# Patient Record
Sex: Female | Born: 1950
Health system: Southern US, Community
[De-identification: ages and names within clinical notes are randomized; demographics above are authoritative.]

## PROBLEM LIST (undated history)

## (undated) DIAGNOSIS — M6530 Trigger finger, unspecified finger: Secondary | ICD-10-CM

## (undated) DIAGNOSIS — I519 Heart disease, unspecified: Secondary | ICD-10-CM

## (undated) DIAGNOSIS — Z9989 Dependence on other enabling machines and devices: Secondary | ICD-10-CM

## (undated) DIAGNOSIS — I50810 Right heart failure, unspecified: Secondary | ICD-10-CM

## (undated) DIAGNOSIS — I493 Ventricular premature depolarization: Secondary | ICD-10-CM

## (undated) DIAGNOSIS — E079 Disorder of thyroid, unspecified: Secondary | ICD-10-CM

## (undated) DIAGNOSIS — I272 Pulmonary hypertension, unspecified: Secondary | ICD-10-CM

## (undated) DIAGNOSIS — E669 Obesity, unspecified: Secondary | ICD-10-CM

## (undated) DIAGNOSIS — I10 Essential (primary) hypertension: Secondary | ICD-10-CM

## (undated) DIAGNOSIS — Z780 Asymptomatic menopausal state: Secondary | ICD-10-CM

## (undated) DIAGNOSIS — N184 Chronic kidney disease, stage 4 (severe): Secondary | ICD-10-CM

## (undated) DIAGNOSIS — E785 Hyperlipidemia, unspecified: Secondary | ICD-10-CM

## (undated) DIAGNOSIS — T8859XA Other complications of anesthesia, initial encounter: Secondary | ICD-10-CM

## (undated) DIAGNOSIS — E0590 Thyrotoxicosis, unspecified without thyrotoxic crisis or storm: Secondary | ICD-10-CM

## (undated) DIAGNOSIS — I6529 Occlusion and stenosis of unspecified carotid artery: Secondary | ICD-10-CM

## (undated) DIAGNOSIS — G4733 Obstructive sleep apnea (adult) (pediatric): Secondary | ICD-10-CM

## (undated) DIAGNOSIS — T4145XA Adverse effect of unspecified anesthetic, initial encounter: Secondary | ICD-10-CM

## (undated) DIAGNOSIS — H3520 Other non-diabetic proliferative retinopathy, unspecified eye: Secondary | ICD-10-CM

## (undated) DIAGNOSIS — R001 Bradycardia, unspecified: Secondary | ICD-10-CM

## (undated) DIAGNOSIS — R809 Proteinuria, unspecified: Secondary | ICD-10-CM

## (undated) DIAGNOSIS — E782 Mixed hyperlipidemia: Principal | ICD-10-CM

## (undated) DIAGNOSIS — E042 Nontoxic multinodular goiter: Secondary | ICD-10-CM

## (undated) DIAGNOSIS — N183 Chronic kidney disease, stage 3 unspecified: Secondary | ICD-10-CM

## (undated) HISTORY — DX: Hyperlipidemia, unspecified: E78.5

## (undated) HISTORY — DX: Disorder of thyroid, unspecified: E07.9

## (undated) HISTORY — DX: Essential (primary) hypertension: I10

## (undated) HISTORY — DX: Obesity, unspecified: E66.9

## (undated) HISTORY — DX: Heart disease, unspecified: I51.9

## (undated) HISTORY — DX: Other non-diabetic proliferative retinopathy, unspecified eye: H35.20

## (undated) HISTORY — DX: Thyrotoxicosis, unspecified without thyrotoxic crisis or storm: E05.90

## (undated) HISTORY — DX: Asymptomatic menopausal state: Z78.0

## (undated) HISTORY — DX: Dependence on other enabling machines and devices: Z99.89

## (undated) HISTORY — DX: Chronic kidney disease, stage 4 (severe): N18.4

## (undated) HISTORY — DX: Occlusion and stenosis of unspecified carotid artery: I65.29

## (undated) HISTORY — DX: Bradycardia, unspecified: R00.1

## (undated) HISTORY — DX: Nontoxic multinodular goiter: E04.2

## (undated) HISTORY — DX: Ventricular premature depolarization: I49.3

## (undated) HISTORY — DX: Right heart failure, unspecified: I50.810

## (undated) HISTORY — DX: Chronic kidney disease, stage 3 unspecified: N18.30

## (undated) HISTORY — DX: Trigger finger, unspecified finger: M65.30

## (undated) HISTORY — DX: Proteinuria, unspecified: R80.9

## (undated) HISTORY — DX: Mixed hyperlipidemia: E78.2

## (undated) HISTORY — DX: Obstructive sleep apnea (adult) (pediatric): G47.33

## (undated) HISTORY — DX: Pulmonary hypertension, unspecified: I27.20

## (undated) HISTORY — DX: Morbid (severe) obesity due to excess calories: E66.01

---
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
## 1999-03-04 HISTORY — PX: NERVE SURGERY: SHX1016

## 2005-04-21 ENCOUNTER — Other Ambulatory Visit: Admission: RE | Admit: 2005-04-21 | Discharge: 2005-04-21 | Payer: Self-pay | Admitting: Family Medicine

## 2005-12-16 ENCOUNTER — Encounter: Admission: RE | Admit: 2005-12-16 | Discharge: 2005-12-16 | Payer: Self-pay | Admitting: Family Medicine

## 2006-01-01 ENCOUNTER — Encounter: Admission: RE | Admit: 2006-01-01 | Discharge: 2006-01-01 | Payer: Self-pay | Admitting: Family Medicine

## 2007-01-15 ENCOUNTER — Encounter: Admission: RE | Admit: 2007-01-15 | Discharge: 2007-01-15 | Payer: Self-pay | Admitting: Interventional Cardiology

## 2007-02-10 ENCOUNTER — Ambulatory Visit: Payer: Self-pay | Admitting: Pulmonary Disease

## 2007-02-10 ENCOUNTER — Inpatient Hospital Stay (HOSPITAL_COMMUNITY): Admission: EM | Admit: 2007-02-10 | Discharge: 2007-02-16 | Payer: Self-pay | Admitting: Emergency Medicine

## 2007-02-12 ENCOUNTER — Encounter (INDEPENDENT_AMBULATORY_CARE_PROVIDER_SITE_OTHER): Payer: Self-pay | Admitting: Internal Medicine

## 2007-02-12 ENCOUNTER — Ambulatory Visit: Payer: Self-pay | Admitting: Vascular Surgery

## 2007-02-15 ENCOUNTER — Encounter: Payer: Self-pay | Admitting: Pulmonary Disease

## 2007-02-16 ENCOUNTER — Encounter: Payer: Self-pay | Admitting: Pulmonary Disease

## 2007-03-22 ENCOUNTER — Ambulatory Visit: Payer: Self-pay | Admitting: Pulmonary Disease

## 2007-03-22 DIAGNOSIS — J449 Chronic obstructive pulmonary disease, unspecified: Secondary | ICD-10-CM

## 2007-03-22 DIAGNOSIS — E785 Hyperlipidemia, unspecified: Secondary | ICD-10-CM

## 2007-03-22 DIAGNOSIS — I10 Essential (primary) hypertension: Secondary | ICD-10-CM

## 2007-03-22 DIAGNOSIS — Z9981 Dependence on supplemental oxygen: Secondary | ICD-10-CM

## 2007-03-22 DIAGNOSIS — R599 Enlarged lymph nodes, unspecified: Secondary | ICD-10-CM | POA: Insufficient documentation

## 2007-03-22 DIAGNOSIS — J4489 Other specified chronic obstructive pulmonary disease: Secondary | ICD-10-CM | POA: Insufficient documentation

## 2007-03-22 DIAGNOSIS — E119 Type 2 diabetes mellitus without complications: Secondary | ICD-10-CM

## 2007-03-31 ENCOUNTER — Encounter: Payer: Self-pay | Admitting: Endocrinology

## 2007-04-08 ENCOUNTER — Encounter: Payer: Self-pay | Admitting: Endocrinology

## 2007-04-27 ENCOUNTER — Encounter: Payer: Self-pay | Admitting: Pulmonary Disease

## 2007-04-30 ENCOUNTER — Encounter: Payer: Self-pay | Admitting: Endocrinology

## 2007-05-06 ENCOUNTER — Ambulatory Visit: Payer: Self-pay | Admitting: Endocrinology

## 2007-05-06 DIAGNOSIS — E079 Disorder of thyroid, unspecified: Secondary | ICD-10-CM | POA: Insufficient documentation

## 2007-05-10 DIAGNOSIS — E0590 Thyrotoxicosis, unspecified without thyrotoxic crisis or storm: Secondary | ICD-10-CM | POA: Insufficient documentation

## 2007-05-11 ENCOUNTER — Telehealth (INDEPENDENT_AMBULATORY_CARE_PROVIDER_SITE_OTHER): Payer: Self-pay | Admitting: *Deleted

## 2007-06-09 ENCOUNTER — Ambulatory Visit: Payer: Self-pay | Admitting: Internal Medicine

## 2007-06-30 ENCOUNTER — Ambulatory Visit: Payer: Self-pay | Admitting: Pulmonary Disease

## 2008-01-11 ENCOUNTER — Ambulatory Visit: Payer: Self-pay | Admitting: Pulmonary Disease

## 2008-01-11 DIAGNOSIS — R0602 Shortness of breath: Secondary | ICD-10-CM

## 2008-03-03 HISTORY — PX: LAPAROSCOPIC GASTRIC BANDING: SHX1100

## 2008-05-26 ENCOUNTER — Ambulatory Visit (HOSPITAL_COMMUNITY): Admission: RE | Admit: 2008-05-26 | Discharge: 2008-05-26 | Payer: Self-pay | Admitting: Surgery

## 2008-05-30 ENCOUNTER — Telehealth (INDEPENDENT_AMBULATORY_CARE_PROVIDER_SITE_OTHER): Payer: Self-pay | Admitting: *Deleted

## 2008-05-30 ENCOUNTER — Ambulatory Visit (HOSPITAL_COMMUNITY): Admission: RE | Admit: 2008-05-30 | Discharge: 2008-05-30 | Payer: Self-pay | Admitting: Surgery

## 2008-06-12 ENCOUNTER — Encounter: Admission: RE | Admit: 2008-06-12 | Discharge: 2008-06-12 | Payer: Self-pay | Admitting: Surgery

## 2008-07-25 ENCOUNTER — Ambulatory Visit: Payer: Self-pay | Admitting: Pulmonary Disease

## 2008-08-02 ENCOUNTER — Encounter: Payer: Self-pay | Admitting: Pulmonary Disease

## 2008-10-05 ENCOUNTER — Encounter: Admission: RE | Admit: 2008-10-05 | Discharge: 2009-01-03 | Payer: Self-pay | Admitting: Surgery

## 2008-10-24 ENCOUNTER — Ambulatory Visit (HOSPITAL_COMMUNITY): Admission: RE | Admit: 2008-10-24 | Discharge: 2008-10-25 | Payer: Self-pay | Admitting: Surgery

## 2009-01-22 ENCOUNTER — Ambulatory Visit: Payer: Self-pay | Admitting: Pulmonary Disease

## 2009-05-17 ENCOUNTER — Encounter: Admission: RE | Admit: 2009-05-17 | Discharge: 2009-05-17 | Payer: Self-pay | Admitting: Surgery

## 2009-05-24 ENCOUNTER — Encounter (HOSPITAL_COMMUNITY): Admission: RE | Admit: 2009-05-24 | Discharge: 2009-08-22 | Payer: Self-pay | Admitting: Internal Medicine

## 2009-07-23 ENCOUNTER — Ambulatory Visit: Payer: Self-pay | Admitting: Internal Medicine

## 2009-07-28 ENCOUNTER — Emergency Department (HOSPITAL_COMMUNITY): Admission: EM | Admit: 2009-07-28 | Discharge: 2009-07-28 | Payer: Self-pay | Admitting: Emergency Medicine

## 2010-01-22 ENCOUNTER — Inpatient Hospital Stay (HOSPITAL_COMMUNITY)
Admission: AD | Admit: 2010-01-22 | Discharge: 2010-01-23 | Payer: Self-pay | Source: Home / Self Care | Admitting: General Surgery

## 2010-01-22 ENCOUNTER — Encounter: Admission: RE | Admit: 2010-01-22 | Discharge: 2010-01-22 | Payer: Self-pay | Admitting: General Surgery

## 2010-03-24 ENCOUNTER — Encounter: Payer: Self-pay | Admitting: Internal Medicine

## 2010-03-24 ENCOUNTER — Encounter: Payer: Self-pay | Admitting: Endocrinology

## 2010-04-02 NOTE — Assessment & Plan Note (Signed)
Summary: NP follow up - COPD   Copy to:  barnes Primary Provider/Referring Provider:  Drema Dallas  CC:  6 month follow for COPD - no complaints today.  History of Present Illness: 60 y.o with obstructive sleep apnea & likley COPD , heavy ex- smoker  on O2 since 11/08 quit smoking 12/08, Dr Radford Pax managing CPAP Desaturates on exertion, Had stopped spiriva but resumed 2/10 for dyspnea Uses O2 sporadically, planning lap band & hiatal hernia surgery with Dr Hassell Done  January 22, 2009 3:12 PM  Pt had LapBand surgery 10/24/2008 , 40 lb wt loss. Thyroid evaluation ongoing. Took flu shot. Off spiriva, no dyspnea.  Jul 23, 2009--Pt returns for 6 month follow for COPD - no complaints today. She is doing great, has lost total of 80lbs. Is much more active, able to walk several tmes a day. Is starting to exercise at gym. Wears CPAP at night, no longer on nocturnal O2. Several of her meds are on lower doses or have been stopped over last few months. She remains off spiriva, doing well. No dyspnea or cough. Denies chest pain, dyspnea, orthopnea, hemoptysis, fever, n/v/d, edema, headache.   Medications Prior to Update: 1)  Furosemide 40 Mg Tabs (Furosemide) .... Take 1 Tablet By Mouth Once A Day 2)  Azor 10-40 Mg  Tabs (Amlodipine-Olmesartan) .... Once Daily 3)  Metformin Hcl 850 Mg  Tabs (Metformin Hcl) .... Take 1 Tablet By Mouth Two Times A Day 4)  Lipitor 40 Mg  Tabs (Atorvastatin Calcium) .... Once Daily 5)  Levimir .... 28 Units 6)  Novolog 100 Unit/ml  Soln (Insulin Aspart) .... Sliding Scales Three Times A Day 7)  Adult Aspirin Ec Low Strength 81 Mg  Tbec (Aspirin) .... Once Daily 8)  Anti-Oxidant   Tabs (Multiple Vitamin) .... Once Daily 9)  Chewable Vitamin C 500 Mg  Chew (Ascorbic Acid) .... 2 Once Daily 10)  Calcium 500/d 500-200 Mg-Unit  Tabs (Calcium Carbonate-Vitamin D) .Marland Kitchen.. 1 Two Times A Day 11)  Alphagan P 0.1 %  Soln (Brimonidine Tartrate) .... Use 1 Drops Two Times A Day in Each Eye  Qd 12)  Xalatan 0.005 %  Soln (Latanoprost) .... Use 1 Drop I  Each Eye Qd 13)  Timolol Maleate 0.25 %  Soln (Timolol Maleate) .... Use 1 Drop in Q Each Eye Qd  Current Medications (verified): 1)  Furosemide 40 Mg Tabs (Furosemide) .... Take 1 Tablet By Mouth Once A Day 2)  Azor 10-40 Mg  Tabs (Amlodipine-Olmesartan) .... Once Daily 3)  Metformin Hcl 850 Mg  Tabs (Metformin Hcl) .... Take 1 Tablet By Mouth Two Times A Day 4)  Lipitor 40 Mg  Tabs (Atorvastatin Calcium) .... Once Daily 5)  Levemir 100 Unit/ml Soln (Insulin Detemir) .... 20 Units Once Daily 6)  Novolog 100 Unit/ml  Soln (Insulin Aspart) .Marland Kitchen.. 10units W/ Each Meal or Sliding Scale 7)  Adult Aspirin Ec Low Strength 81 Mg  Tbec (Aspirin) .... Once Daily 8)  Anti-Oxidant   Tabs (Multiple Vitamin) .... Once Daily 9)  Chewable Vitamin C 500 Mg  Chew (Ascorbic Acid) .... 2 Once Daily 10)  Calcium 500/d 500-200 Mg-Unit  Tabs (Calcium Carbonate-Vitamin D) .Marland Kitchen.. 1 Two Times A Day 11)  Alphagan P 0.1 %  Soln (Brimonidine Tartrate) .... Use 1 Drops Two Times A Day in Each Eye Qd 12)  Xalatan 0.005 %  Soln (Latanoprost) .... Use 1 Drop I  Each Eye Once Daily 13)  Timolol Maleate 0.25 %  Soln (Timolol  Maleate) .... Use 1 Drop in Each Eye Once Daily 14)  Methimazole 10 Mg Tabs (Methimazole) .... Take 1 Tablet By Mouth Once A Day  Allergies (verified): 1)  ! Epinephrine Hcl (Epinephrine Hcl)  Past History:  Past Medical History: Last updated: 03/22/2007 C O P D Diabetes, Type 2 Hyperlipidemia Hypertension  Family History: Last updated: 05/06/2007 grandmother had uncertain type of thyroid problem  Social History: Last updated: 07/23/2009 single retired from [employer] in [place] former smoker-quit in 2008.  x2yrs, 1ppd occ alcohol - wine  Risk Factors: Smoking Status: quit (01/11/2008)  Past Pulmonary History:  Pulmonary History: adm 11/08 for hyprecarbic resp failure, CT chest neg PE, BNP 273 PFTs 11/08 FEV1 52%, ratio 93,  FVC 42 % with 18 5 improvement with BD PSG ( dr Turner)>> AHI 64/h with lowest destan  to 62% >> BiPAP titrated to 21/15  4/09 > CT shows reactive LNs, trial off spiriva 11/09>> compliant with CPAP, wonders if she needs O2 at all, doing well off spiriva, she has decreased lasix to 40 bid PFTs 11/09>> no obstruction, moderate restriction TLC 65%, DLCO 38%  corrects for alveolar volume Spirometry Jul 23, 2009 >>FEV1 2.04 l (82%), ratio 80.  Desaturates on exertion.  Social History: single retired from [employer] in [place] former smoker-quit in 2008.  x64yrs, 1ppd occ alcohol - wine  Review of Systems      See HPI  Vital Signs:  Patient profile:   60 year old female Height:      63 inches Weight:      232 pounds BMI:     41.25 O2 Sat:      98 % on Room air Temp:     97.9 degrees F oral Pulse rate:   63 / minute BP sitting:   124 / 66  (left arm) Cuff size:   large  Vitals Entered By: Parke Poisson CNA/MA (Jul 23, 2009 10:08 AM)  O2 Flow:  Room air CC: 6 month follow for COPD - no complaints today Is Patient Diabetic? Yes Comments Medications reviewed with patient Daytime contact number verified with patient. Parke Poisson CNA/MA  Jul 23, 2009 10:09 AM   Ambulatory Pulse Oximetry  Resting; HR__73___    02 Sat__94___  Lap1 (185 feet)   HR__88___   02 Sat___95__ Lap2 (185 feet)   HR__89___   02 Sat__95___    Lap3 (185 feet)   HR__88___   02 Sat__93___  _X__Test Completed without Difficulty ___Test Stopped due to:  Test performed by Clayborne Dana CMA Jul 23, 2009    Physical Exam  Additional Exam:  Gen. Pleasant, well-nourished, in no distress ENT - no lesions, no post nasal drip Neck: No JVD, no thyromegaly, no carotid bruits Lungs: no use of accessory muscles, no dullness to percussion, clear without rales or rhonchi  Cardiovascular: Rhythm regular, heart sounds  normal, no murmurs or gallops, no peripheral edema Musculoskeletal: No deformities, no cyanosis or  clubbing      Pulmonary Function Test Date: 07/23/2009 10:46 AM Gender: Female  Pre-Spirometry FVC    Value: 2.53 L/min   % Pred: 78.80 % FEV1    Value: 2.04 L     Pred: 2.49 L     % Pred: 81.90 % FEV1/FVC  Value: 80.53 %     % Pred: 102.90 %  Impression & Recommendations:  Problem # 1:  C O P D (ICD-496) Well compensated on no meds. She is doing very well w/ her weight loss s/p lap  band surgery.  No desaturations with walking. Spirometry shows no decline in FEV1 may remain off spiriva follow up 1 year.   Medications Added to Medication List This Visit: 1)  Levemir 100 Unit/ml Soln (Insulin detemir) .... 20 units once daily 2)  Novolog 100 Unit/ml Soln (Insulin aspart) .Marland Kitchen.. 10units w/ each meal or sliding scale 3)  Xalatan 0.005 % Soln (Latanoprost) .... Use 1 drop i  each eye once daily 4)  Timolol Maleate 0.25 % Soln (Timolol maleate) .... Use 1 drop in each eye once daily 5)  Methimazole 10 Mg Tabs (Methimazole) .... Take 1 tablet by mouth once a day  Complete Medication List: 1)  Furosemide 40 Mg Tabs (Furosemide) .... Take 1 tablet by mouth once a day 2)  Azor 10-40 Mg Tabs (Amlodipine-olmesartan) .... Once daily 3)  Metformin Hcl 850 Mg Tabs (Metformin hcl) .... Take 1 tablet by mouth two times a day 4)  Lipitor 40 Mg Tabs (Atorvastatin calcium) .... Once daily 5)  Levemir 100 Unit/ml Soln (Insulin detemir) .... 20 units once daily 6)  Novolog 100 Unit/ml Soln (Insulin aspart) .Marland Kitchen.. 10units w/ each meal or sliding scale 7)  Adult Aspirin Ec Low Strength 81 Mg Tbec (Aspirin) .... Once daily 8)  Anti-oxidant Tabs (Multiple vitamin) .... Once daily 9)  Chewable Vitamin C 500 Mg Chew (Ascorbic acid) .... 2 once daily 10)  Calcium 500/d 500-200 Mg-unit Tabs (Calcium carbonate-vitamin d) .Marland Kitchen.. 1 two times a day 11)  Alphagan P 0.1 % Soln (Brimonidine tartrate) .... Use 1 drops two times a day in each eye qd 12)  Xalatan 0.005 % Soln (Latanoprost) .... Use 1 drop i  each eye once  daily 13)  Timolol Maleate 0.25 % Soln (Timolol maleate) .... Use 1 drop in each eye once daily 14)  Methimazole 10 Mg Tabs (Methimazole) .... Take 1 tablet by mouth once a day  Other Orders: Pulse Oximetry, Ambulatory [id number]) Spirometry w/Graph (94010) Est. Patient Level III [id number])  Patient Instructions: 1)  You are doing a great job, keep up the good work.  2)  follow up Dr. Elsworth Soho in 1 year and as needed .   Immunization History:  Pneumovax Immunization History:    Pneumovax:  historical (03/04/2007)    CardioPerfect Spirometry  ID: [id number] Patient: ADALIS, LIM DOB: 11-Nov-1950 Age: 60 Years Old Sex: Female Race: White Physician: [name] Height: 63 Weight: 232 Smoker: No Has Pacemaker? No Status: Unconfirmed Past Medical History:  C O P D Diabetes, Type 2 Hyperlipidemia Hypertension  Recorded: 07/23/2009 10:46 AM  Parameter  Measured Predicted %Predicted FVC     2.53        3.21        78.80 FEV1     2.04        2.49        81.90 FEV1%   80.53        78.27        102.90 PEF    4.31        6.19        69.50   Interpretation:

## 2010-05-14 LAB — DIFFERENTIAL
Basophils Absolute: 0.1 10*3/uL (ref 0.0–0.1)
Basophils Relative: 0 % (ref 0–1)
Eosinophils Absolute: 0 10*3/uL (ref 0.0–0.7)
Eosinophils Relative: 0 % (ref 0–5)
Neutrophils Relative %: 71 % (ref 43–77)

## 2010-05-14 LAB — GLUCOSE, CAPILLARY
Glucose-Capillary: 126 mg/dL — ABNORMAL HIGH (ref 70–99)
Glucose-Capillary: 139 mg/dL — ABNORMAL HIGH (ref 70–99)
Glucose-Capillary: 147 mg/dL — ABNORMAL HIGH (ref 70–99)
Glucose-Capillary: 169 mg/dL — ABNORMAL HIGH (ref 70–99)
Glucose-Capillary: 203 mg/dL — ABNORMAL HIGH (ref 70–99)
Glucose-Capillary: 236 mg/dL — ABNORMAL HIGH (ref 70–99)
Glucose-Capillary: 236 mg/dL — ABNORMAL HIGH (ref 70–99)
Glucose-Capillary: 268 mg/dL — ABNORMAL HIGH (ref 70–99)
Glucose-Capillary: 310 mg/dL — ABNORMAL HIGH (ref 70–99)

## 2010-05-14 LAB — BLOOD GAS, ARTERIAL
Acid-Base Excess: 3.3 mmol/L — ABNORMAL HIGH (ref 0.0–2.0)
TCO2: 23.6 mmol/L (ref 0–100)
pCO2 arterial: 36.9 mmHg (ref 35.0–45.0)
pH, Arterial: 7.47 — ABNORMAL HIGH (ref 7.350–7.400)
pO2, Arterial: 380 mmHg — ABNORMAL HIGH (ref 80.0–100.0)

## 2010-05-14 LAB — CBC
HCT: 39.1 % (ref 36.0–46.0)
Hemoglobin: 13.3 g/dL (ref 12.0–15.0)
MCH: 31.5 pg (ref 26.0–34.0)
MCH: 31.6 pg (ref 26.0–34.0)
MCHC: 34 g/dL (ref 30.0–36.0)
MCV: 92.7 fL (ref 78.0–100.0)
MCV: 93 fL (ref 78.0–100.0)
Platelets: 233 10*3/uL (ref 150–400)
RBC: 3.51 MIL/uL — ABNORMAL LOW (ref 3.87–5.11)
RDW: 14.3 % (ref 11.5–15.5)
RDW: 14.4 % (ref 11.5–15.5)

## 2010-05-14 LAB — BASIC METABOLIC PANEL
BUN: 53 mg/dL — ABNORMAL HIGH (ref 6–23)
BUN: 57 mg/dL — ABNORMAL HIGH (ref 6–23)
CO2: 26 mEq/L (ref 19–32)
CO2: 29 mEq/L (ref 19–32)
Calcium: 8.5 mg/dL (ref 8.4–10.5)
Chloride: 104 mEq/L (ref 96–112)
Chloride: 96 mEq/L (ref 96–112)
Creatinine, Ser: 1.43 mg/dL — ABNORMAL HIGH (ref 0.4–1.2)
GFR calc Af Amer: 45 mL/min — ABNORMAL LOW (ref >60)
Glucose, Bld: 495 mg/dL — ABNORMAL HIGH (ref 70–99)
Potassium: 3.5 mEq/L (ref 3.5–5.1)
Sodium: 145 mEq/L (ref 135–145)

## 2010-05-30 ENCOUNTER — Encounter: Payer: 59 | Attending: Surgery | Admitting: *Deleted

## 2010-05-30 DIAGNOSIS — Z713 Dietary counseling and surveillance: Secondary | ICD-10-CM | POA: Insufficient documentation

## 2010-05-30 DIAGNOSIS — Z09 Encounter for follow-up examination after completed treatment for conditions other than malignant neoplasm: Secondary | ICD-10-CM | POA: Insufficient documentation

## 2010-05-30 DIAGNOSIS — Z9884 Bariatric surgery status: Secondary | ICD-10-CM | POA: Insufficient documentation

## 2010-06-08 LAB — COMPREHENSIVE METABOLIC PANEL
ALT: 15 U/L (ref 0–35)
Albumin: 3.5 g/dL (ref 3.5–5.2)
Alkaline Phosphatase: 64 U/L (ref 39–117)
Calcium: 9.7 mg/dL (ref 8.4–10.5)
GFR calc Af Amer: >60 mL/min (ref >60)
Glucose, Bld: 250 mg/dL — ABNORMAL HIGH (ref 70–99)
Potassium: 4.5 mEq/L (ref 3.5–5.1)
Sodium: 135 mEq/L (ref 135–145)
Total Protein: 7 g/dL (ref 6.0–8.3)

## 2010-06-08 LAB — DIFFERENTIAL
Basophils Absolute: 0 10*3/uL (ref 0.0–0.1)
Eosinophils Absolute: 0.2 10*3/uL (ref 0.0–0.7)
Eosinophils Relative: 0 % (ref 0–5)
Lymphocytes Relative: 15 % (ref 12–46)
Lymphs Abs: 1.6 10*3/uL (ref 0.7–4.0)
Lymphs Abs: 1.7 10*3/uL (ref 0.7–4.0)
Monocytes Absolute: 0.7 10*3/uL (ref 0.1–1.0)
Monocytes Absolute: 1 10*3/uL (ref 0.1–1.0)
Monocytes Relative: 7 % (ref 3–12)
Neutrophils Relative %: 75 % (ref 43–77)

## 2010-06-08 LAB — GLUCOSE, CAPILLARY
Glucose-Capillary: 238 mg/dL — ABNORMAL HIGH (ref 70–99)
Glucose-Capillary: 254 mg/dL — ABNORMAL HIGH (ref 70–99)

## 2010-06-08 LAB — HEMOGLOBIN A1C
Hgb A1c MFr Bld: 7.9 % — ABNORMAL HIGH (ref 4.6–6.1)
Mean Plasma Glucose: 180 mg/dL

## 2010-06-08 LAB — CBC
HCT: 34.1 % — ABNORMAL LOW (ref 36.0–46.0)
Hemoglobin: 11.6 g/dL — ABNORMAL LOW (ref 12.0–15.0)
MCHC: 34.2 g/dL (ref 30.0–36.0)
Platelets: 255 10*3/uL (ref 150–400)
RDW: 13.8 % (ref 11.5–15.5)
RDW: 14.6 % (ref 11.5–15.5)

## 2010-06-08 LAB — CREATININE, SERUM
Creatinine, Ser: 1.17 mg/dL (ref 0.4–1.2)
GFR calc Af Amer: 57 mL/min — ABNORMAL LOW (ref >60)

## 2010-06-08 LAB — HEMOGLOBIN AND HEMATOCRIT, BLOOD: Hemoglobin: 13.1 g/dL (ref 12.0–15.0)

## 2010-07-16 NOTE — H&P (Signed)
NAME:  Kathleen [name], Kathleen [name]              ACCOUNT NO.:  [id number]   MEDICAL RECORD NO.:  [id number]          PATIENT TYPE:  EMS   LOCATION:  [facility]                         FACILITY:  [facility]   PHYSICIAN:  Farris Has, MDDATE OF BIRTH:  04/09/1950   DATE OF ADMISSION:  02/10/2007  DATE OF DISCHARGE:                              HISTORY & PHYSICAL   PRIMARY CARE PHYSICIAN:  Dr. Evelena Leyden or Dr. Harrington Challenger.   PRIMARY CARDIOLOGIST:  Dr. Irish Lack.   CHIEF COMPLAINT:  Shortness of breath.   HPI:  This is a 60 year old female with history of diabetes, severe  sleep apnea, and right-sided heart failure called forward by Dr.  Irish Lack who reports having severe lower extremity edema since September  and for the past few weeks seem to have been having persistent episodes  of acute onset shortness of breath which would come and go.  She had a  really bad episode on Sunday but stayed at home and did okay and then  again had another episode of severe shortness of breath and decided to  actually present to the emergency department.  Other than that, patient  does endorse severe dyspnea on exertion at baseline, severe orthopnea.  Patient was a smoker up to 1 month ago and she has quit, has not had any  cough or wheezing since she quit smoking.  She has a 35-pack year  history.  Of note, for the past 2 weeks, patient was started on home O2  by her primary care Zerah Hilyer given these episodes of shortness of  breath.  She was initially supposed to be on 2 L but had an overnight  pulse ox study done as an outpatient.  Per patient, after this, her  oxygen at night at least was turned up to 4 L.  Per patient, she also  has had a sleep study done which was reported to be positive and per  discussion with Dr. Irish Lack it seemed like patient has very severe  sleep apnea with desats down to 40% at home unless she is wearing  oxygen.   PAST MEDICAL HISTORY:  Significant for:  1. Diabetes.  2. Glaucoma.  3.  Sleep apnea.  4. Right side Heart failure.  5. Hypertension.   ALLERGIES:  EPINEPHRINE CAUSES SWELLING.   MEDICATIONS:  1. Lasix 80 mg p.o. q.a.m. and 40 mg p.o. q.p.m.  2. Metolazone 2.5 mg every other day.  3. Metformin 850 mg p.o. t.i.d.  4. Atenolol100 mg p.o. once a day.  5. Glyburide 10 mg p.o. once a day.  6. Xalatan 0.25 to both eyes.  7. Alphagan 0.2 twice a day.  8. Timolol 0.5% once a day.  9. Baby aspirin.  10.NovoLog 7 units with meals.  11.Levemir 50 units q.h.s.  12.Colace 100 mg p.o. q.p.m.   SOCIAL HISTORY:  Significant for 35 pack year smoking history.  No  alcohol.  No drugs.  The patient is currently retired, not married.   FAMILY HISTORY:  Significant for mother with CVA and broken hip.  Father  died of coronary artery disease about 30 years ago.  Sister  healthy.  Grandfather had a lung cancer.   REVIEW OF SYSTEMS:  Negative for fevers, chills, sweats.  Positive for  occasional congestion.  No headache.  No chest pain.  She does report  shortness of breath, dyspnea on exertion, orthopnea.  She does not  report any urinary problems.  No nausea, vomiting, diarrhea, bright red  blood per rectum, or melena.   PHYSICAL EXAM:  Temperature 98.4.  Pulse 69.  Respirations 16.  Blood  pressure 158/75.  Satting 97% on 4 L.  GENERAL:  Obese female in no acute distress.  HEENT:  PERRLA.  Moist mucous membranes.  NECK:  Obese.  HEART:  Regular rate and rhythm.  No murmurs, rubs, or gallops.  LUNGS:  Distant breath sounds bilaterally but no crackles.  SKIN:  Slightly red.  Slight erythema on the shins bilaterally.  ABDOMEN:  Obese but nontender.  EXTREMITIES:  Two-plus pitting edema bilaterally.  Patient feels like  left worse than right.  NEUROLOGICAL:  Intact.   STUDIES:  Chest x-ray showing very mild edema and cardiomegaly.  EKG  showed sinus bradycardia down to 59, slight R-waves in D1.  No acute  ischemia infarction noted.   LABS:  CBC not obtained.   Creatinine 0.9, potassium 4.0, BNP 273, D-  dimer slightly elevated at 1.46.  Cardiac enzymes x1 within normal  limits.  Bicarb not obtained but ABG shows pH of 7.435 and PCO2 of 51.   ASSESSMENT AND PLAN:  This is a 60 year old female with history of  severe sleep apnea and right-sided failure, now presenting with  shortness of breath and worsening peripheral edema.  1. Shortness of breath and worsening peripheral, likely that this      peripheral edema is attributing to right-sided failure which is      probably second to severe sleep apnea.  Shortness of breath could      be multifactorial.  Patient does have a history of smoking.  Chest      x-ray does not show severe pulmonary edema and B-type natriuretic      peptide is only slightly elevated.  Given a history of intermittent      acute onset shortness of breath episodes, we will obtain CT of the      chest to rule out pulmonary embolism, for that will obtain a V/Q      scan in the future would also be helpful.  CT scan also help assess      lung parenchyma and assess for emphysematous changes.  2. Concerning lower extremity edema, we will start patient on Lasix 80      mg intravenous twice day.  We will also put patient on BiPAP      overnight with FiO2 of 45% and wean as needed.  For right now, we      will hold off on metolazone.  Dr. Irish Lack will see patient in the      morning and see if diuretics need to be readjusted.  3. Hypertension.  We will continue atenolol.  4. Diabetes.  We will hold off on metformin while in house.  We will      continue glyburide and continue her Levemir and NovoLog with meals.  5. History of positive Cardiolite as per Dr. Irish Lack.  We will      continue aspirin, obtain fasting lipid panel in the morning.  As      per Dr. Irish Lack, patient is not a good candidate for coronary  catheterization but cardiology will follow in the morning.  6. History of smoking, wonder if chronic obstructive  pulmonary disease      is playing a role in this.  We will consider consulting pulmonology      in the morning.  We will obtain ABG for now to further determine      degree of      hypoxia.  7. Peripheral access.  Protonix and Lovenox.   CODE STATUS:  Patient wishes to be DNR/DNI; this was discussed with  patient.      Farris Has, MD  Electronically Signed     AVD/MEDQ  D:  02/10/2007  T:  02/11/2007  Job:  [id number]   cc:   Jettie Booze, MD  Theodoro Kos, DO

## 2010-07-16 NOTE — Consult Note (Signed)
NAME:  Kathleen [name], MCNEECE              ACCOUNT NO.:  [id number]   MEDICAL RECORD NO.:  [id number]          PATIENT TYPE:  INP   LOCATION:  3742                         FACILITY:  [facility]   PHYSICIAN:  Rigoberto Noel, MD      DATE OF BIRTH:  1950/08/13   DATE OF CONSULTATION:  DATE OF DISCHARGE:                                 CONSULTATION   Ms. Marine is a 60 year old, morbidly obese (138 kg) heavy smoker who  was recently started on oxygen since thanksgiving by her primary care  physician, Dr. Renard Hamper.  Pulmonary function tests during this admission  showed an FEV1/FVC ratio of 93, and FEV1 of 52%, and FVC of 42% with 18%  improvement in her FVC.  Although the test is more suggestive of  restriction, the improvement with bronchodilator suggests some degree of  obstruction.   She was on a [place] cruise earlier this year and was admitted with  worsening dyspnea and lower extremity edema.  An arterial blood gas was  7.46/51/39 on room air and 7.49/44/83 on 4 L nasal cannula.  After a few  days, chest CT did not show any pulmonary embolism.  BNP level on  admission was 273.  Chest x-ray showed cardiomegaly and congestion.  Cardiac enzymes were negative.   An overnight sleep study showed an AHI of 63.8 per hour with the lowest  desaturation of 62% with a total sleep time of 331 minutes and a sleep  efficiency of 85%.  No slow-wave sleep or REM sleep was observed.  During a subsequent titration study, BiPAP was initiated at 4 cm of  water and increased to 21/15 cm of water.  She maintained REM sleep  during the study but I do not have the titration details.   PAST MEDICAL HISTORY:  Also includes:  1. Diabetes.  2. Hypertension.  3. Hyperlipidemia.   MEDICATIONS AT HOME:  1. Metformin __________ 50 mg t.i.d.  2. Glyburide 10 mg per day.  3. Atenolol 100 mg per day.  4. Lisinopril/HCTZ 20/12.5 mg b.i.d.  5. Lasix 80 mg.  6. __________ 40 mg q.p.m.  7. Lipitor 40 mg daily.  8. NovoLog  50 units daily.  9. Aspirin 81 mg daily.  10.Zaroxolyn 2.5 mg every other day.  11.Oxygen 4 liters nasal cannula.   HOSPITAL COURSE:  BiPAP was initiated during the hospital admission at  plus 8/4 cm which she tolerated as a full-face mask.  A trial of  albuterol nebs was started and she seemed to breathe fuller with this.  Due to polyarthritis  , a serum ANA was ordered and this is pending at  the time of dictation.   RECOMMEND:  1. I have written a prescription for auto BiPAP titration and this      will be sent to [facility] with 3 L of oxygen blended and she will use      this with a full-face mask.  Further titration will be done as an      outpatient based on results of auto BiPAP titration study.  2. She will be diuresed by cardiology based  on her renal function.  3. A trial of Spiriva can be undertaken based on her improvement with      bronchodilators.  Outpatient followup was arranged for March 21, 2006, at 10:15 a.m.      Rigoberto Noel, MD  Electronically Signed     RVA/MEDQ  D:  02/15/2007  T:  02/16/2007  Job:  [id number]

## 2010-07-16 NOTE — Discharge Summary (Signed)
NAMEKAITLYNNE, ZYSKOWSKI              ACCOUNT NO.:  [id number]   MEDICAL RECORD NO.:  [id number]          PATIENT TYPE:  INP   LOCATION:  3742                         FACILITY:  [facility]   PHYSICIAN:  Farris Has, MDDATE OF BIRTH:  1950/03/17   DATE OF ADMISSION:  02/10/2007  DATE OF DISCHARGE:  02/16/2007                               DISCHARGE SUMMARY   DISCHARGE DIAGNOSES:  1. Right side heart failure.  2. Severe sleep apnea.  3. Multinodular goiter.  4. Chronic obstructive pulmonary disease.  5. Diabetes mellitus.  6. Glaucoma.  7. Hypertension.  8. Chest lymphadenopathy.  9. Right hand swelling.   LIST OF CONSULTANTS:  1. Dr. Radford Pax.  2. Dr. Irish Lack.  3. Dr. Elsworth Soho.   STUDIES:  1. CT angiogram of chest showing no pulmonary emboli, thoracic aortic      dissection, cardiomegaly, central ground-glass opacity likely      pulmonary edema, small right effusion, and right bibasilar      atelectasis, mildly enlarged upper mediastinal lymph nodes, thyroid      lesions, largest 2.2 cm in the left thyroid.  2. Chest x-ray done on 10 February 2007 showing cardiomegaly with mild      early edema and basilar atelectasis.  3. Pulmonary studies, FVC 42% predicted, FEV1 of 52% predicted,      FEV1/FVC ratio 117, post bronch response ratio 111, good patient      effort.  Interpretation, moderately severe obstructive airway      disease, severe restrictive disease.  4. Films of her right hand, no acute findings, possible remote      evulsion fracture, right thumb.  Doppler of right arm shows no DVT.   H&P:  Please see full H&P for complete history but briefly this is a 60-  year-old female with history of diabetes, severe sleep apnea, and right-  sided heart failure followed by Dr. Irish Lack who presented with severe  shortness of breath and extremity edema.  1. Severe shortness of breath was attributed likely to right-sided      failure.  Patient was admitted on IV Lasix and did  well with 20-      pound diuresis.  Dr. Irish Lack had followed the patient while in      house.  We adjusted her atenolol down given some bradycardia.  We      have increased her home dose of Lasix to 160 mg p.o. twice a day.      Patient to follow up with Dr. Irish Lack after discharge.  Her right-      sided failure most likely secondary to her sleep apnea.  2. Sleep apnea, very severe.  Patient was seen in house by Dr. Elsworth Soho.      Her BiPAP settings were titrated while in house.  Her sleep study      was reviewed.  It was noted that patient did not do well on the      CPAP alone.  Blood gas after CPAP was noted to be ABG of      7.464/51/39.2 which is a PO2.  Blood gas after BiPAP  on the 16th of      December was 7.418, PCO2 of 61, and PO2 of 72.6.  Still noticed was      somewhat elevated PCO2 but this began right after completion of a      BiPAP.  Patient was discharged to home on continuous oxygen at 3 L      while at rest and increased to 4 L with ambulation and BiPAP      settings between 8/4 to 20/15 with full-face mask at 3 L O2 blended  3. COPD.  Given some degree of obstructive picture, as well as history      of tobacco abuse.  We will discharge patient on Spiriva and      Combivent p.r.n.  Spiriva to be taken every day.  I have also      backed down a little bit on her atenolol down to 25 mg p.o. daily      from 100.  4. Right hand swelling.  This has been a chronic problem for patient.      We have ruled out DVT by ultrasound.  I have also obtained plain      films which are negative.  Patient will need to follow up with      orthopedics for this.  5. Multinodular goiter per CT scan.  TSH within normal limits.      Attempted to obtain a radioactive iodine uptake scan while in house      but this was not able to be done.  Patient will need to follow up      with her primary care Contrina Orona for this in the future.  6. Multiple nodes in the chest.  Antinuclear antibody negative.       Patient is to have followup workup with her pulmonologist to assess      for sarcoid and other illnesses.  Also, may need follow up CT scan      about 3 months.  7. Diabetes.  Hemoglobin A1c 7.5.  Continue home regimen, will have it      adjusted by primary care physician.   On discharge, patient's vital signs stable.  Blood pressure 160s/76.  She is satting 98% on 3 L.  Weight on discharge was 130 which is going  to be her dry weight.   DISCHARGE LABS:  Hemoglobin 11.3, creatinine 0.74, BNP 286.   FOLLOWUP APPOINTMENTS:  1. Patient to follow up with her primary care Dezaria Methot in 1 to 2      weeks.  2. Patient to follow up with pulmonology, Dr. Elsworth Soho, on January 19th at      10:15 a.m.  3. Patient to follow up with Dr. Irish Lack in 1 to 2 weeks.  4. Patient to have sleep studies and PFTs and call [phone] for that.   Patient is to use her BiPAP daily while asleep.   Patient to return or seek immediate medical attention if develops any  chest pain or shortness of breath or if her weight increases by 3 pounds  per day or by 5 pounds per week.  Patient to check daily weights.      Farris Has, MD  Electronically Signed     AVD/MEDQ  D:  02/16/2007  T:  02/16/2007  Job:  [id number]   cc:   Jettie Booze, MD  Rigoberto Noel, MD  Theodoro Kos, DO  Fransico Him, M.D.

## 2010-07-16 NOTE — Op Note (Signed)
NAME:  Kathleen [name], Kathleen [name]              ACCOUNT NO.:  [id number]   MEDICAL RECORD NO.:  [id number]          PATIENT TYPE:  AMB   LOCATION:  DAY                          FACILITY:  [facility]   PHYSICIAN:  Isabel Caprice. Hassell Done, MD  DATE OF BIRTH:  [birth year]   DATE OF PROCEDURE:  10/24/2008  DATE OF DISCHARGE:                               OPERATIVE REPORT   PREOPERATIVE DIAGNOSIS:  Morbid obesity, BMI of 50, gastroesophageal  reflux disease.   POSTOPERATIVE DIAGNOSES:  Small hiatal hernia, morbid obesity, and  umbilical hernia.   SURGEON:  Isabel Caprice. Hassell Done, M.D.   ASSISTANT:  Fenton Malling. Lucia Gaskins, M.D.   PROCEDURE:  Laparoscopic posterior repair of hiatal hernia with a single  stitch, Allergan APL system lap band, open repair of umbilical hernia.   ANESTHESIA:  General endotracheal.   DESCRIPTION OF PROCEDURE:  [name] is a 60 year old lady taken to  room 11 on Tuesday, October 24, 2008, and given general anesthesia.  The  abdomen was prepped with Techni-Care equivalent and draped sterilely.  Access to the abdomen was achieved with a 0-degree OptiView placed  through the left upper quadrant without difficulty.  The abdomen was  insufflated and then the standard trocar placements were used.  Upon  entering the abdomen with the scope, I noticed that there was omentum  stuck up in this umbilical hernia.  This was easily retrieved.  At that  point I felt like it would probably be good to go ahead and repair this  while we were here.  A Nathanson retractor was inserted.  She did have a  very large liver and with a small rib cage made somewhat limited our  access to the foregut.  However, once the [name] was in place, I went  ahead and dissected posteriorly and anteriorly I could see a dimple  which was fairly obvious and I went ahead and placed a single stitch  posteriorly.  That sort of precluded the tubing from going in smoothly  and I went ahead and because of the amount of foregut fat,  I elected to  use an APL system.  The band passer was passed without difficulty and an  APL band was inserted and brought around and buckled.  The buckle was  engaged and it was plicated with 3 sutures using Surgidac and tie knots.  The tip of the tubing was then brought out through the abdomen.   The umbilicus was cut down upon the curvilinear incision and the  umbilical hernia sac was freed up from the surrounding tissue.  I then  amputated the excess sac and then closed the little hernia transversely  with interrupted #1 Novofil under direct vision.  We did this with the  abdomen insufflated and we insufflated after the case and there was no  leak and I went ahead and injected all the port sites with some  Marcaine.  I deflated the abdomen and implanted the band to the port  which had Marlex mesh and placed on the backside and was placed in a  subcutaneous  position just lateral to the lower port  on the right.  The wounds were  irrigated and closed with 4-0 Vicryl, Benzoin, and Steri-Strips.  The  patient tolerated the procedure well and was taken to the recovery room  in satisfactory condition.      Isabel Caprice Hassell Done, MD  Electronically Signed     MBM/MEDQ  D:  10/24/2008  T:  10/24/2008  Job:  [id number]   cc:   Dr. Earle Gell

## 2010-07-19 ENCOUNTER — Encounter (INDEPENDENT_AMBULATORY_CARE_PROVIDER_SITE_OTHER): Payer: Self-pay | Admitting: Surgery

## 2010-08-30 ENCOUNTER — Encounter (INDEPENDENT_AMBULATORY_CARE_PROVIDER_SITE_OTHER): Payer: 59

## 2010-09-27 ENCOUNTER — Encounter (INDEPENDENT_AMBULATORY_CARE_PROVIDER_SITE_OTHER): Payer: 59

## 2010-10-11 ENCOUNTER — Encounter (INDEPENDENT_AMBULATORY_CARE_PROVIDER_SITE_OTHER): Payer: 59

## 2010-10-18 ENCOUNTER — Encounter (INDEPENDENT_AMBULATORY_CARE_PROVIDER_SITE_OTHER): Payer: 59

## 2010-10-24 ENCOUNTER — Encounter (INDEPENDENT_AMBULATORY_CARE_PROVIDER_SITE_OTHER): Payer: Self-pay | Admitting: Surgery

## 2010-10-25 ENCOUNTER — Encounter (INDEPENDENT_AMBULATORY_CARE_PROVIDER_SITE_OTHER): Payer: Self-pay | Admitting: Physician Assistant

## 2010-10-25 ENCOUNTER — Ambulatory Visit (INDEPENDENT_AMBULATORY_CARE_PROVIDER_SITE_OTHER): Payer: 59 | Admitting: Physician Assistant

## 2010-10-25 VITALS — BP 140/76 | Ht 63.0 in | Wt 219.2 lb

## 2010-10-25 DIAGNOSIS — Z4651 Encounter for fitting and adjustment of gastric lap band: Secondary | ICD-10-CM

## 2010-10-25 NOTE — Patient Instructions (Signed)
Take clear liquids for the next 48 hours. Thin protein shakes are ok to start on Saturday evening. Call us if you have persistent vomiting or regurgitation, night cough or reflux symptoms. Return as scheduled or sooner if you notice no changes in hunger/portion sizes.

## 2010-10-25 NOTE — Progress Notes (Signed)
  HISTORY: JUNETTA KAMADA is a 60 y.o.female who received an AP-Large lap-band in August 2010 with revision in November 2011 by Dr. Hassell Done. She's been doing well but notes her hunger has returned and her portion sizes are larger than she thinks they should be. Fortunately she denies any vomiting or regurgitation symptoms. She would like a fill today.  VITAL SIGNS: Filed Vitals:   10/25/10 0938  BP: 140/76    PHYSICAL EXAM: Physical exam reveals a very well-appearing 60 y.o.female in no apparent distress Neurologic: Awake, alert, oriented Psych: Bright affect, conversant Respiratory: Breathing even and unlabored. No stridor or wheezing Abdomen: Soft, nontender, nondistended to palpation. Incisions well-healed. No incisional hernias. Port easily palpated. Extremities: Atraumatic, good range of motion.  ASSESMENT: 60 y.o.  female  s/p AP-Large lap-band.   PLAN: The patient's port was accessed with a 20G Huber needle without difficulty. Clear fluid was aspirated and 0.5 mL saline was added to the port to give a total volume of 8.5 mL. The patient was able to swallow water without difficulty following the procedure and was instructed to take clear liquids for the next 24-48 hours and advance slowly as tolerated.

## 2010-12-06 LAB — BLOOD GAS, ARTERIAL
Acid-Base Excess: 13.5 — ABNORMAL HIGH
Bicarbonate: 38.8 — ABNORMAL HIGH
O2 Content: 3
O2 Saturation: 94.2
Patient temperature: 98.6

## 2010-12-06 LAB — CBC
HCT: 35 — ABNORMAL LOW
Hemoglobin: 11.3 — ABNORMAL LOW
MCHC: 32.3
MCHC: 32.5
MCV: 89.1
MCV: 89.2
RDW: 16.7 — ABNORMAL HIGH
RDW: 17.3 — ABNORMAL HIGH

## 2010-12-06 LAB — BASIC METABOLIC PANEL
CO2: 39 — ABNORMAL HIGH
CO2: 41 — ABNORMAL HIGH
Calcium: 9
Chloride: 93 — ABNORMAL LOW
Creatinine, Ser: 0.74
Glucose, Bld: 70
Glucose, Bld: 80
Potassium: 3.5
Sodium: 142

## 2010-12-09 LAB — BLOOD GAS, ARTERIAL
Acid-Base Excess: 11.7 — ABNORMAL HIGH
Bicarbonate: 34 — ABNORMAL HIGH
Drawn by: 279341
Patient temperature: 98.6
TCO2: 35.4
pCO2 arterial: 44.4
pCO2 arterial: 51 — ABNORMAL HIGH
pH, Arterial: 7.464 — ABNORMAL HIGH
pH, Arterial: 7.496 — ABNORMAL HIGH
pO2, Arterial: 83

## 2010-12-09 LAB — CBC
HCT: 32.2 — ABNORMAL LOW
HCT: 32.3 — ABNORMAL LOW
HCT: 32.9 — ABNORMAL LOW
HCT: 35.9 — ABNORMAL LOW
Hemoglobin: 10.9 — ABNORMAL LOW
Hemoglobin: 11.7 — ABNORMAL LOW
Hemoglobin: 12.1
MCHC: 32.1
MCHC: 32.6
MCHC: 33
MCV: 88.3
MCV: 88.7
MCV: 89.1
MCV: 89.7
Platelets: 225
Platelets: 266
RBC: 3.64 — ABNORMAL LOW
RBC: 3.65 — ABNORMAL LOW
RBC: 3.72 — ABNORMAL LOW
RBC: 4.2
RDW: 17 — ABNORMAL HIGH
RDW: 17.2 — ABNORMAL HIGH
WBC: 5.9
WBC: 6.9

## 2010-12-09 LAB — DIFFERENTIAL
Eosinophils Absolute: 0.2
Eosinophils Absolute: 0.3
Eosinophils Relative: 2
Eosinophils Relative: 4
Lymphocytes Relative: 20
Lymphs Abs: 1.4
Lymphs Abs: 1.5
Monocytes Relative: 14 — ABNORMAL HIGH
Monocytes Relative: 9

## 2010-12-09 LAB — PROTIME-INR: Prothrombin Time: 13.8

## 2010-12-09 LAB — BASIC METABOLIC PANEL
BUN: 25 — ABNORMAL HIGH
BUN: 26 — ABNORMAL HIGH
BUN: 27 — ABNORMAL HIGH
CO2: 37 — ABNORMAL HIGH
CO2: 39 — ABNORMAL HIGH
Calcium: 8.9
Calcium: 9.1
Chloride: 93 — ABNORMAL LOW
Chloride: 94 — ABNORMAL LOW
Creatinine, Ser: 0.71
Creatinine, Ser: 0.92
GFR calc Af Amer: >60
GFR calc Af Amer: >60
GFR calc Af Amer: >60
GFR calc non Af Amer: >60
GFR calc non Af Amer: >60
Glucose, Bld: 201 — ABNORMAL HIGH
Glucose, Bld: 204 — ABNORMAL HIGH
Potassium: 3.2 — ABNORMAL LOW
Potassium: 3.6
Potassium: 3.7
Sodium: 140
Sodium: 141

## 2010-12-09 LAB — POCT I-STAT CREATININE: Creatinine, Ser: 0.9

## 2010-12-09 LAB — LIPID PANEL
HDL: 44
Total CHOL/HDL Ratio: 3.1
VLDL: 12

## 2010-12-09 LAB — CARDIAC PANEL(CRET KIN+CKTOT+MB+TROPI)
Relative Index: INVALID
Total CK: 94
Troponin I: 0.03

## 2010-12-09 LAB — I-STAT 8, (EC8 V) (CONVERTED LAB)
BUN: 35 — ABNORMAL HIGH
Glucose, Bld: 238 — ABNORMAL HIGH
Potassium: 4
TCO2: 36
pH, Ven: 7.435 — ABNORMAL HIGH

## 2010-12-09 LAB — LACTATE DEHYDROGENASE: LDH: 236

## 2010-12-09 LAB — COMPREHENSIVE METABOLIC PANEL
ALT: 13
AST: 20
CO2: 37 — ABNORMAL HIGH
Calcium: 9.2
Chloride: 96
GFR calc Af Amer: >60
GFR calc non Af Amer: >60
Sodium: 139

## 2010-12-09 LAB — POCT CARDIAC MARKERS
CKMB, poc: 3.5
Myoglobin, poc: 169
Troponin i, poc: <0.05

## 2010-12-09 LAB — HEMOGLOBIN A1C
Hgb A1c MFr Bld: 7.5 — ABNORMAL HIGH
Mean Plasma Glucose: 190

## 2010-12-09 LAB — ANGIOTENSIN CONVERTING ENZYME: Angiotensin-Converting Enzyme: 12 U/L (ref 9–67)

## 2010-12-09 LAB — B-NATRIURETIC PEPTIDE (CONVERTED LAB): Pro B Natriuretic peptide (BNP): 242 — ABNORMAL HIGH

## 2011-10-30 ENCOUNTER — Ambulatory Visit: Payer: 59 | Admitting: *Deleted

## 2011-11-12 ENCOUNTER — Ambulatory Visit: Payer: 59 | Admitting: *Deleted

## 2012-04-23 ENCOUNTER — Other Ambulatory Visit: Payer: Self-pay | Admitting: Family Medicine

## 2012-04-26 ENCOUNTER — Ambulatory Visit
Admission: RE | Admit: 2012-04-26 | Discharge: 2012-04-26 | Disposition: A | Discharge status: Home / Self Care | Payer: 59 | Source: Ambulatory Visit | Attending: Family Medicine | Admitting: Family Medicine

## 2012-04-26 DIAGNOSIS — R0989 Other specified symptoms and signs involving the circulatory and respiratory systems: Secondary | ICD-10-CM

## 2012-05-27 ENCOUNTER — Encounter (INDEPENDENT_AMBULATORY_CARE_PROVIDER_SITE_OTHER): Payer: 59 | Admitting: Surgery

## 2012-05-28 ENCOUNTER — Encounter (INDEPENDENT_AMBULATORY_CARE_PROVIDER_SITE_OTHER): Payer: Self-pay

## 2012-07-28 ENCOUNTER — Ambulatory Visit (INDEPENDENT_AMBULATORY_CARE_PROVIDER_SITE_OTHER): Payer: 59 | Admitting: Surgery

## 2012-07-28 ENCOUNTER — Encounter (INDEPENDENT_AMBULATORY_CARE_PROVIDER_SITE_OTHER): Payer: Self-pay | Admitting: Surgery

## 2012-07-28 VITALS — BP 160/92 | HR 60 | Temp 96.3°F | Resp 14 | Ht 63.0 in | Wt 276.6 lb

## 2012-07-28 DIAGNOSIS — R809 Proteinuria, unspecified: Secondary | ICD-10-CM

## 2012-07-28 MED ORDER — AZITHROMYCIN 250 MG PO TABS: ORAL_TABLET | ORAL | Status: AC

## 2012-07-28 NOTE — Progress Notes (Addendum)
Lapband Fill Encounter Problem List:   Patient Active Problem List   Diagnosis Date Noted  . DYSPNEA 01/11/2008  . HYPERTHYROIDISM 05/10/2007  . UNSPECIFIED DISORDER OF THYROID 05/06/2007  . DIABETES, TYPE 2 03/22/2007  . HYPERLIPIDEMIA 03/22/2007  . HYPERTENSION 03/22/2007  . C O P D 03/22/2007  . OBSTRUCTIVE SLEEP APNEA 03/22/2007  . MEDIASTINAL ADENOPATHY CASTLEMANS D 03/22/2007  . OXYGEN-USE OF SUPPLEMENTAL 03/22/2007    Kathleen [name] Body mass index is 49.01 kg/(m^2). Weight loss since surgery  9 pounds  Having regurgitation?:  No  Feel that they need a fill?  Yes but not today. She has a leading to go to this weekend.   Nocturnal reflux?  No  Amount of fill  0     Instructions given and weight loss goals discussed.    [name] returns with a letter from Dr. Hassell Done who saw her regarding her proteinuria. Dr. Drema Dallas had referred her to nephrology because she was spilling over a gram of protein on a 24-hour urine. Dr. Hassell Done felt that she would benefit from re\re gaining control of her weight. I saw her today to discuss a lap band fill. She wants to do this but not today since she has a wedding this weekend.  I will get an appointment to see [name] next week.  Today I found 7 cc in her lapband APL  Matt B. Hassell Done, MD, FACS   Having problems with URI - cough and requested a Zpack to help her.  Exam is consistent with URI.  RX for Zpack. Matt B. Hassell Done, MD, [facility] [phone] beeper [phone]  07/28/2012 2:24 PM

## 2012-07-28 NOTE — Addendum Note (Signed)
Addended by: Johnathan Hausen B on: 07/28/2012 02:25 PM   Modules accepted: Orders

## 2012-07-28 NOTE — Patient Instructions (Signed)
Gastric Banding Surgery Care After Refer to this sheet in the next few weeks. These discharge instructions provide you with general information on caring for yourself after you leave the hospital. Your caregiver may also give you specific instructions. Your treatment has been planned according to the most current medical practices available, but unavoidable complications sometimes occur. If you have any problems or questions after discharge, call your caregiver. HOME CARE INSTRUCTIONS  Activity  Take frequent walks throughout the day. This will help to prevent blood clots. Do not sit for longer than 45 minutes to 1 hour while awake for 4 to 6 weeks after surgery.  Continue to do coughing and deep breathing exercises once you get home. This will help to prevent pneumonia.  Do not do strenuous activities, such as heavy lifting, pushing, or pulling, until after your follow-up visit with your caregiver. Do not lift anything heavier than 10 lb (4.5 kg).  Talk with your caregiver about when you may return to work and your exercise routine.  Do not drive while taking prescription pain medicine. Nutrition  It is very important that you drink at least 80 oz (2,400 mL) of fluid a day.  You should stay on a clear liquid diet until your follow-up visit with your caregiver. Keep sugar-free, clear liquid items on hand, including:  Tea: hot or cold. Drink only decaffeinated for the first month.  Broths: clear beef, chicken, vegetable.  Others: water, sugar-free frozen ice pops, flavored water, gelatin (after 1 week).  Do not consume caffeine for 1 month. Large amounts of caffeine can cause dehydration.  A dietician may also give you specific instructions.  Follow your caregiver's recommendations about vitamins and protein requirements after surgery. Hygiene  You may shower and wash your hair 2 days after surgery. Pat incisions dry. Do not rub incisions with a washcloth or towel.  Follow your  caregiver's recommendations about baths and pools following surgery. Pain control  If a prescription medicine was given, follow your caregiver's directions.  You may feel some gas pain caused by the carbon dioxide used to inflate your abdomen during surgery. This pain can be felt in your chest, shoulder, back, or abdominal area. Moving around often is advised. Incision care You may have 4 or more small incisions. They are closed with skin adhesive strips and have a clear plastic covering over them. You may remove your dressings the number of days directed by your caregiver after surgery. Check your incisions and surrounding area daily for any redness, swelling, discoloration, fluid (drainage), or bleeding. Dark red, dried blood may appear under these coverings. This is normal. SEEK MEDICAL CARE IF:   You develop persistent nausea and vomiting.  You have pain and discomfort with swallowing.  You have pain, swelling, or warmth in the lower extremities.  You have an oral temperature above 102 F (38.9 C).  You develop chills.  Your incision sites look red, swollen, or have drainage.  Your stool is black, tarry, or maroon in color.  You are lightheaded when standing.  You have any questions or concerns. SEEK IMMEDIATE MEDICAL CARE IF:   You have chest pain.  You have severe calf pain or pain not relieved by medicine.  You develop shortness of breath or difficulty breathing.  You feel confused.  You have slurred speech.  You suddenly feel weak. MAKE SURE YOU:   Understand these instructions.  Will watch your condition.  Will get help right away if you are not doing well or get  worse. Document Released: 10/02/2003 Document Revised: 05/12/2011 Document Reviewed: 07/10/2009 [facility] Patient Information 2014 [place].

## 2012-08-05 ENCOUNTER — Encounter (INDEPENDENT_AMBULATORY_CARE_PROVIDER_SITE_OTHER): Payer: Self-pay

## 2012-08-05 ENCOUNTER — Ambulatory Visit (INDEPENDENT_AMBULATORY_CARE_PROVIDER_SITE_OTHER): Payer: 59 | Admitting: Physician Assistant

## 2012-08-05 VITALS — BP 138/86 | HR 68 | Temp 98.0°F | Resp 18 | Ht 63.0 in | Wt 275.8 lb

## 2012-08-05 DIAGNOSIS — Z4651 Encounter for fitting and adjustment of gastric lap band: Secondary | ICD-10-CM

## 2012-08-05 NOTE — Patient Instructions (Signed)
Take clear liquids tonight. Thin protein shakes are ok to start tomorrow morning. Slowly advance your diet thereafter. Call us if you have persistent vomiting or regurgitation, night cough or reflux symptoms. Return as scheduled or sooner if you notice no changes in hunger/portion sizes.  

## 2012-08-05 NOTE — Progress Notes (Signed)
  HISTORY: Kathleen [name] is a 62 y.o.female who received an AP-Large lap-band in August 2010 with a revision in November 2011 by Dr. Hassell Done. She comes in a week after seeing Dr. Hassell Done for a re-check. She did not receive a fill at her last visit secondary to travel. He did measure her volume however, which was 7 mL. She denies regurgitation or reflux but does complain of hunger and eating larger portion sizes. She wants to get back on track today with a fill.  VITAL SIGNS: Filed Vitals:   08/05/12 1508  BP: 138/86  Pulse: 68  Temp: 98 F (36.7 C)  Resp: 18    PHYSICAL EXAM: Physical exam reveals a very well-appearing 62 y.o.female in no apparent distress Neurologic: Awake, alert, oriented Psych: Bright affect, conversant Respiratory: Breathing even and unlabored. No stridor or wheezing Abdomen: Soft, nontender, nondistended to palpation. Incisions well-healed. No incisional hernias. Port easily palpated. Extremities: Atraumatic, good range of motion.  ASSESMENT: 62 y.o.  female  s/p AP-Large lap-band.   PLAN: The patient's port was accessed with a 20G Huber needle without difficulty. Clear fluid was aspirated and 1 mL saline was added to the port to give a total predicted volume of 8 mL. The patient was able to swallow water without difficulty following the procedure and was instructed to take clear liquids for the next 24-48 hours and advance slowly as tolerated.

## 2012-09-09 ENCOUNTER — Ambulatory Visit (INDEPENDENT_AMBULATORY_CARE_PROVIDER_SITE_OTHER): Payer: 59 | Admitting: Physician Assistant

## 2012-09-09 ENCOUNTER — Encounter (INDEPENDENT_AMBULATORY_CARE_PROVIDER_SITE_OTHER): Payer: Self-pay

## 2012-09-09 DIAGNOSIS — Z9884 Bariatric surgery status: Secondary | ICD-10-CM

## 2012-09-09 NOTE — Patient Instructions (Addendum)
Return in one month. Focus on good food choices as well as physical activity. Return sooner if you have an increase in hunger, portion sizes or weight. Return also for difficulty swallowing, night cough, reflux. Take Omeprazole 20mg , one tablet daily for two weeks.

## 2012-09-09 NOTE — Progress Notes (Signed)
  HISTORY: Kathleen [name] is a 62 y.o.female who received an AP-Large lap-band in August 2010 with a revision in November 2011 by Dr. Hassell Done. She comes in with 17 lbs weight loss since her last visit one month ago. She denies persistent hunger or large portion sizes but she's had a couple of bouts of heartburn a week since the last fill. She is able to tolerate solids without difficulty and she believes certain foods are triggering the reflux symptoms. She's pleased with her weight loss and her overall health so she is reluctant to have any fluid removed.  VITAL SIGNS: Filed Vitals:   09/09/12 1437  BP: 152/84  Pulse: 78  Resp: 14    PHYSICAL EXAM: Physical exam reveals a very well-appearing 62 y.o.female in no apparent distress Neurologic: Awake, alert, oriented Psych: Bright affect, conversant Respiratory: Breathing even and unlabored. No stridor or wheezing Extremities: Atraumatic, good range of motion. Skin: Warm, Dry, no rashes Musculoskeletal: Normal gait, Joints normal  ASSESMENT: 62 y.o.  female  s/p AP-Large lap-band.   PLAN: We talked about removing a small amount of fluid vs. Watching her symptoms over the next month. She would like to keep an eye on things, primarily because she's able to eat without difficulty and she'd like to continue losing weight. I've asked her to take OTC omeprazole for a couple of weeks in case this is garden variety GERD. She was instructed to call us should her symptoms worsen. She voiced understanding and agreement.

## 2012-10-21 ENCOUNTER — Encounter (INDEPENDENT_AMBULATORY_CARE_PROVIDER_SITE_OTHER): Payer: Self-pay

## 2012-10-21 ENCOUNTER — Ambulatory Visit (INDEPENDENT_AMBULATORY_CARE_PROVIDER_SITE_OTHER): Payer: 59 | Admitting: Physician Assistant

## 2012-10-21 DIAGNOSIS — Z9884 Bariatric surgery status: Secondary | ICD-10-CM

## 2012-10-21 NOTE — Progress Notes (Signed)
  HISTORY: Kathleen [name] is a 62 y.o.female who received an AP-Large lap-band in August 2010 with a revision in November 2011 by Dr. Hassell Done. She comes in with 12 lbs further weight loss since her last visit. Her GERD symptoms have improved dramatically. She's taking solids without difficulty but she does have to pay attention to her food choices. She doesn't feel compelled to have an adjustment today.  VITAL SIGNS: Filed Vitals:   10/21/12 1423  BP: 142/80  Pulse: 74  Resp: 16    PHYSICAL EXAM: Physical exam reveals a very well-appearing 62 y.o.female in no apparent distress Neurologic: Awake, alert, oriented Psych: Bright affect, conversant Respiratory: Breathing even and unlabored. No stridor or wheezing Extremities: Atraumatic, good range of motion. Skin: Warm, Dry, no rashes Musculoskeletal: Normal gait, Joints normal  ASSESMENT: 62 y.o.  female  s/p AP-Large lap-band.   PLAN: As she's in the green zone, we deferred a fill today. We'll have her return in three months or sooner if needed.

## 2012-10-21 NOTE — Patient Instructions (Signed)
Return in three months. Focus on good food choices as well as physical activity. Return sooner if you have an increase in hunger, portion sizes or weight. Return also for difficulty swallowing, night cough, reflux.   

## 2012-11-30 ENCOUNTER — Encounter (INDEPENDENT_AMBULATORY_CARE_PROVIDER_SITE_OTHER): Payer: Self-pay

## 2013-01-13 ENCOUNTER — Encounter (INDEPENDENT_AMBULATORY_CARE_PROVIDER_SITE_OTHER): Payer: 59

## 2013-02-03 ENCOUNTER — Encounter (INDEPENDENT_AMBULATORY_CARE_PROVIDER_SITE_OTHER): Payer: Self-pay

## 2013-02-03 ENCOUNTER — Ambulatory Visit (INDEPENDENT_AMBULATORY_CARE_PROVIDER_SITE_OTHER): Payer: 59 | Admitting: Physician Assistant

## 2013-02-03 DIAGNOSIS — Z9884 Bariatric surgery status: Secondary | ICD-10-CM

## 2013-02-03 NOTE — Patient Instructions (Signed)
Return in three months. Focus on good food choices as well as physical activity. Return sooner if you have an increase in hunger, portion sizes or weight. Return also for difficulty swallowing, night cough, reflux.   

## 2013-02-03 NOTE — Progress Notes (Signed)
  HISTORY: Kathleen [name] is a 62 y.o.female who received an AP-Large lap-band in August 2010 with a revision in November 2011 by Dr. Hassell Done. She comes in with 25 lbs weight loss since August. She is very happy with her progress. She has no regurgitation or reflux symptoms. She is getting her protein in the form of eggs and chicken. She is very pleased with her level of restriction.  VITAL SIGNS: Filed Vitals:   02/03/13 1407  BP: 148/90  Pulse: 60  Temp: 97.3 F (36.3 C)  Resp: 16    PHYSICAL EXAM: Physical exam reveals a very well-appearing 62 y.o.female in no apparent distress Neurologic: Awake, alert, oriented Psych: Bright affect, conversant Respiratory: Breathing even and unlabored. No stridor or wheezing Extremities: Atraumatic, good range of motion. Skin: Warm, Dry, no rashes Musculoskeletal: Normal gait, Joints normal  ASSESMENT: 62 y.o.  female  s/p AP-Large lap-band.   PLAN: As she's in the green zone, we deferred a fill today. I asked her to return in three months unless she begins having issues with obstruction or lack thereof.

## 2013-04-25 ENCOUNTER — Encounter (INDEPENDENT_AMBULATORY_CARE_PROVIDER_SITE_OTHER): Payer: Self-pay | Admitting: Physician Assistant

## 2013-06-02 ENCOUNTER — Encounter (INDEPENDENT_AMBULATORY_CARE_PROVIDER_SITE_OTHER): Payer: Self-pay

## 2013-06-02 ENCOUNTER — Ambulatory Visit (INDEPENDENT_AMBULATORY_CARE_PROVIDER_SITE_OTHER): Payer: BC Managed Care – PPO | Admitting: Physician Assistant

## 2013-06-02 VITALS — BP 130/78 | HR 64 | Temp 98.2°F | Resp 14 | Ht 63.0 in | Wt 203.4 lb

## 2013-06-02 DIAGNOSIS — Z4651 Encounter for fitting and adjustment of gastric lap band: Secondary | ICD-10-CM

## 2013-06-02 NOTE — Progress Notes (Signed)
  HISTORY: Kathleen [name] is a 63 y.o.female who received an AP-Large lap-band in August 2010 with a revision in November 2011 by Dr. Hassell Done. She comes in with 18 lbs weight loss since her last visit in December. She has lost a total of 82 lbs since surgery. She reports little hunger but she has about 3 episodes per week of food getting stuck resulting in regurgitation. She also complains of reflux, many times at night. She had been taking TUMS but she reports that this caused some issues with her calcium level. She would like some fluid removed today to help with her symptoms.  VITAL SIGNS: Filed Vitals:   06/02/13 0848  BP: 130/78  Pulse: 64  Temp: 98.2 F (36.8 C)  Resp: 14    PHYSICAL EXAM: Physical exam reveals a very well-appearing 63 y.o.female in no apparent distress Neurologic: Awake, alert, oriented Psych: Bright affect, conversant Respiratory: Breathing even and unlabored. No stridor or wheezing Abdomen: Soft, nontender, nondistended to palpation. Incisions well-healed. No incisional hernias. Port easily palpated. Extremities: Atraumatic, good range of motion.  ASSESMENT: 63 y.o.  female  s/p AP-Large lap-band.   PLAN: The patient's port was accessed with a 20G Huber needle without difficulty. Clear fluid was aspirated and 0.25 mL saline was removed from the port to give a total predicted volume of 7.75 mL. The patient was advised to concentrate on healthy food choices and to avoid slider foods high in fats and carbohydrates. I suggested omeprazole daily for the next couple of weeks. Hopefully this fluid removal will help with her GERD issues and will allow her to more easily eat healthy foods without fear of obstruction. We'll have her back in two months. I asked her to call us in a couple of weeks if today's measures don't improve her symptoms.

## 2013-06-02 NOTE — Patient Instructions (Signed)
Return in two months. Focus on good food choices as well as physical activity. Return sooner if you have an increase in hunger, portion sizes or weight. Return also for difficulty swallowing, night cough, reflux.   

## 2013-08-04 ENCOUNTER — Encounter (INDEPENDENT_AMBULATORY_CARE_PROVIDER_SITE_OTHER): Payer: BC Managed Care – PPO

## 2013-08-18 ENCOUNTER — Encounter (INDEPENDENT_AMBULATORY_CARE_PROVIDER_SITE_OTHER): Payer: BC Managed Care – PPO

## 2013-08-25 ENCOUNTER — Ambulatory Visit (INDEPENDENT_AMBULATORY_CARE_PROVIDER_SITE_OTHER): Payer: BC Managed Care – PPO | Admitting: Physician Assistant

## 2013-08-25 ENCOUNTER — Encounter (INDEPENDENT_AMBULATORY_CARE_PROVIDER_SITE_OTHER): Payer: Self-pay | Admitting: Physician Assistant

## 2013-08-25 VITALS — BP 132/80 | HR 78 | Temp 98.0°F | Resp 18 | Ht 63.0 in | Wt 199.0 lb

## 2013-08-25 DIAGNOSIS — Z9884 Bariatric surgery status: Secondary | ICD-10-CM

## 2013-08-25 NOTE — Patient Instructions (Signed)
Return in three months. Focus on good food choices as well as physical activity. Return sooner if you have an increase in hunger, portion sizes or weight. Return also for difficulty swallowing, night cough, reflux.   

## 2013-08-25 NOTE — Progress Notes (Signed)
  HISTORY: Kathleen [name] is a 63 y.o.female who received an AP-Large lap-band in August 2010 with a revision in November 2011 by Dr. Hassell Done. She comes in with 4 lbs further weight loss since her last visit in April. She has lost a total of 86 lbs since surgery, bringing her weight below 200 lbs for the first time. She is very pleased with this. She has no complaints of regurgitation or reflux. She says her hunger is under very good control and overall, her portion sizes are remaining small. She occasionally has foods high in carbohydrates which she can eat more of, but she's aware of this and keeps it to a minimum. She is planning on a trip to [place] next year which requires a large amount of walking so she's planning on increasing her physical activity now to prepare.  VITAL SIGNS: Filed Vitals:   08/25/13 0919  BP: 132/80  Pulse: 78  Temp: 98 F (36.7 C)  Resp: 18    PHYSICAL EXAM: Physical exam reveals a very well-appearing 63 y.o.female in no apparent distress Neurologic: Awake, alert, oriented Psych: Bright affect, conversant Respiratory: Breathing even and unlabored. No stridor or wheezing Extremities: Atraumatic, good range of motion. Skin: Warm, Dry, no rashes Musculoskeletal: Normal gait, Joints normal  ASSESMENT: 63 y.o.  female  s/p AP-large lap-band.   PLAN: As she's in the green zone today, we deferred a fill (0.25 mL was removed at her last visit for persistent reflux). I asked her to return in three months or sooner should there be any issues. I encouraged her to watch her food choices carefully and to slowly begin increasing physical activity so as to minimize injury. She voiced understanding and agreement.

## 2013-09-12 ENCOUNTER — Other Ambulatory Visit (HOSPITAL_COMMUNITY)
Admission: RE | Admit: 2013-09-12 | Discharge: 2013-09-12 | Disposition: A | Discharge status: Home / Self Care | Payer: BC Managed Care – PPO | Source: Ambulatory Visit | Attending: Nurse Practitioner | Admitting: Nurse Practitioner

## 2013-09-12 ENCOUNTER — Other Ambulatory Visit: Payer: Self-pay | Admitting: Nurse Practitioner

## 2013-09-12 DIAGNOSIS — Z1151 Encounter for screening for human papillomavirus (HPV): Secondary | ICD-10-CM | POA: Insufficient documentation

## 2013-09-12 DIAGNOSIS — Z01419 Encounter for gynecological examination (general) (routine) without abnormal findings: Secondary | ICD-10-CM | POA: Insufficient documentation

## 2013-09-13 ENCOUNTER — Encounter: Payer: Self-pay | Admitting: Podiatry

## 2013-09-13 ENCOUNTER — Ambulatory Visit (INDEPENDENT_AMBULATORY_CARE_PROVIDER_SITE_OTHER): Payer: BC Managed Care – PPO | Admitting: Podiatry

## 2013-09-13 VITALS — BP 152/70 | HR 58 | Ht 63.0 in | Wt 199.0 lb

## 2013-09-13 DIAGNOSIS — M21969 Unspecified acquired deformity of unspecified lower leg: Secondary | ICD-10-CM

## 2013-09-13 DIAGNOSIS — M216X9 Other acquired deformities of unspecified foot: Secondary | ICD-10-CM

## 2013-09-13 DIAGNOSIS — M659 Synovitis and tenosynovitis, unspecified: Secondary | ICD-10-CM

## 2013-09-13 DIAGNOSIS — M21962 Unspecified acquired deformity of left lower leg: Secondary | ICD-10-CM

## 2013-09-13 DIAGNOSIS — M216X2 Other acquired deformities of left foot: Secondary | ICD-10-CM

## 2013-09-13 LAB — CYTOLOGY - PAP

## 2013-09-13 NOTE — Progress Notes (Signed)
Left ankle pain off and on x 4-5 months. It is getting worse.   Objective:  Varicose veins bilateral. Hypermobile first ray left. STJ hyperpronation left. All pedal pulses are palpable. Mild subjective numbness along the ball of foot bilateral.  Assessment: Tenosynovitis left ankle. Hypermobile first ray with STJ hyperpronation.  Plan: May benefit from ankle brace. Will prepare Orthotics next visit.

## 2013-09-13 NOTE — Patient Instructions (Signed)
Seen for pain in left ankle. Ankle brace placed on left ankle. Return in 3 weeks.  May benefit from custom orthotics.

## 2013-09-14 DIAGNOSIS — M216X9 Other acquired deformities of unspecified foot: Secondary | ICD-10-CM | POA: Insufficient documentation

## 2013-09-14 DIAGNOSIS — M659 Synovitis and tenosynovitis, unspecified: Secondary | ICD-10-CM | POA: Insufficient documentation

## 2013-09-14 DIAGNOSIS — M21962 Unspecified acquired deformity of left lower leg: Secondary | ICD-10-CM | POA: Insufficient documentation

## 2013-09-14 DIAGNOSIS — M21961 Unspecified acquired deformity of right lower leg: Secondary | ICD-10-CM | POA: Insufficient documentation

## 2013-09-15 ENCOUNTER — Telehealth: Payer: Self-pay | Admitting: Interventional Cardiology

## 2013-09-15 NOTE — Telephone Encounter (Signed)
Appt moved up to 09/29/13. Pt aware that if she has episodes presyncope or syncope she should call or go to the ED. Pt will try to see Dr. Drema Dallas in the meantime.

## 2013-09-15 NOTE — Telephone Encounter (Signed)
New Message  Pt called reports her Bp is fluxuating and her pulse is below 60. Pt has a re- establishing appointment scheduled for 11/03/2013 and requests a call back from the nurse to discuss a sooner appt if needed, given her symptoms. Please call

## 2013-09-15 NOTE — Telephone Encounter (Addendum)
Spoke with pt and he BP is running 108-140's/60-40's. HR's are running in the 40's and low 50's. Pt has occasional dizziness. Pt hasnt seen Dr. Irish Lack since 2012. Pt recently stopped spironolactone on her own, which has made her feel better.

## 2013-09-15 NOTE — Telephone Encounter (Signed)
lmtrc

## 2013-09-22 ENCOUNTER — Encounter: Payer: Self-pay | Admitting: Cardiology

## 2013-09-22 DIAGNOSIS — E782 Mixed hyperlipidemia: Secondary | ICD-10-CM

## 2013-09-22 HISTORY — DX: Mixed hyperlipidemia: E78.2

## 2013-09-29 ENCOUNTER — Ambulatory Visit (INDEPENDENT_AMBULATORY_CARE_PROVIDER_SITE_OTHER): Payer: BC Managed Care – PPO | Admitting: Interventional Cardiology

## 2013-09-29 ENCOUNTER — Encounter: Payer: Self-pay | Admitting: Interventional Cardiology

## 2013-09-29 VITALS — BP 122/80 | HR 80 | Ht 63.0 in | Wt 200.0 lb

## 2013-09-29 DIAGNOSIS — I10 Essential (primary) hypertension: Secondary | ICD-10-CM

## 2013-09-29 DIAGNOSIS — R9430 Abnormal result of cardiovascular function study, unspecified: Secondary | ICD-10-CM | POA: Insufficient documentation

## 2013-09-29 DIAGNOSIS — E785 Hyperlipidemia, unspecified: Secondary | ICD-10-CM

## 2013-09-29 NOTE — Progress Notes (Signed)
Patient ID: Kathleen [name], female   DOB: 03/25/1950, 62 y.o.   MRN: [id number]     Patient ID: Kathleen [name] MRN: [id number] DOB/AGE: 1951-01-10 24 y.o.   Referring Physician Dr. Drema Dallas   Reason for Consultation HTN  HPI: 63 y/o woman who has had lap band surgery. She required a repeat operation.  She had an abnormal stress test but was managed medically. SHe had been feeling well.   Hypertension:  Denies : Chest pain. Dizziness. Leg edema. Orthopnea. Paroxysmal nocturnal dyspnea. Palpitations. Shortness of breath. Dyspnea. Syncope.  She has not been exercising due to "laziness." SHe uses her CPAP regularly without problems.  She gained a lot of weight back and then over the past year, lost nearly 100 lbs.    Her BP started increasing and she had chlorthalidone added.  It was still high so she was given spironolactone.  She had very low BPs and felt quite poorly.  She stopped it after taking it for 3 months.  SHe feels much better.  Her BPs have increased to the 119-140s range more commonly.  She would use the spironolactone prn.  Then she stopped it altogether due to erratic BP.    No recordings over 150.   Current Outpatient Prescriptions  Medication Sig Dispense Refill  . ALPHAGAN P 0.15 % ophthalmic solution       . amLODipine-olmesartan (AZOR) 10-40 MG per tablet Take 1 tablet by mouth daily.        Marland Kitchen aspirin 81 MG tablet Take 81 mg by mouth daily.        Marland Kitchen atorvastatin (LIPITOR) 40 MG tablet Take 40 mg by mouth daily.        Marland Kitchen azithromycin (ZITHROMAX) 250 MG tablet       . CALCIUM PO Take by mouth.        . chlorthalidone (HYGROTON) 25 MG tablet Take 25 mg by mouth daily.      Marland Kitchen latanoprost (XALATAN) 0.005 % ophthalmic solution       . LEVEMIR FLEXPEN 100 UNIT/ML SOPN       . metFORMIN (GLUCOPHAGE) 850 MG tablet Take 850 mg by mouth 2 (two) times daily with a meal.        . methimazole (TAPAZOLE) 10 MG tablet Take 5 mg by mouth daily.       . Multiple Vitamin  (MULTIVITAMIN) capsule Take 1 capsule by mouth daily.        Marland Kitchen NOVOLOG FLEXPEN 100 UNIT/ML injection Inject 20 Units into the skin daily.       . ONE TOUCH ULTRA TEST test strip       . timolol (TIMOPTIC) 0.5 % ophthalmic solution       . VITAMIN C, CALCIUM ASCORBATE, PO Take by mouth.        . spironolactone (ALDACTONE) 12.5 mg TABS tablet Take 12.5 mg by mouth daily.       No current facility-administered medications for this visit.   Past Medical History  Diagnosis Date  . Diabetes mellitus   . Hypertension   . Hyperlipidemia   . Thyroid disease   . Glaucoma   . Heart disease   . Menopause   . Mixed hyperlipidemia 09/22/2013  . OSA on CPAP   . Morbid obesity   . Pulmonary hypertension   . Right-sided heart failure   . Multinodular goiter     AND HYPERTHYROIDISM   . Proliferative retinopathy     moderate, Dr. Posey Pronto  . Trigger  finger     Dr. Rip Harbour  . Proteinuria     Dr. Hassell Done  . Carotid artery plaque     mild to moderate less tan 50% stenosis on u/s 2/14    Family History  Problem Relation Age of Onset  . Heart attack Father     History   Social History  . Marital Status: Single    Spouse Name: N/A    Number of Children: N/A  . Years of Education: N/A   Occupational History  . Not on file.   Social History Main Topics  . Smoking status: Former Research scientist (life sciences)  . Smokeless tobacco: Not on file  . Alcohol Use: No  . Drug Use: No  . Sexual Activity: Not on file   Other Topics Concern  . Not on file   Social History Narrative  . No narrative on file    Past Surgical History  Procedure Laterality Date  . Tonsillectomy  1958  . Laparoscopic gastric banding  2010  . Nerve surgery  2001      (Not in a hospital admission)  Review of systems complete and found to be negative unless listed above .  No nausea, vomiting.  No fever chills, No focal weakness,  No palpitations.  Physical Exam: Filed Vitals:   09/29/13 1512  BP: 122/80  Pulse: 80    Weight: 200  lb (90.719 kg)  Physical exam:  [place]/AT EOMI No JVD, No carotid bruit RRR S1S2  No wheezing Soft. NT, nondistended No edema. No focal motor or sensory deficits Normal affect  Labs:   Lab Results  Component Value Date   WBC 12.6* 01/23/2010   HGB 11.0 DELTA CHECK NOTED RESULT REPEATED AND VERIFIED* 01/23/2010   HCT 32.5* 01/23/2010   MCV 92.7 01/23/2010   PLT 233 DELTA CHECK NOTED RESULT REPEATED AND VERIFIED SPECIMEN CHECKED FOR CLOTS 01/23/2010   No results found for this basename: NA, K, CL, CO2, BUN, CREATININE, CALCIUM, LABALBU, PROT, BILITOT, ALKPHOS, ALT, AST, GLUCOSE,  in the last 168 hours Lab Results  Component Value Date   CKTOTAL 85 02/11/2007   CKMB 2.3 02/11/2007   TROPONINI  Value: 0.03        NO INDICATION OF MYOCARDIAL INJURY. 02/11/2007    Lab Results  Component Value Date   CHOL  Value: 138        ATP III CLASSIFICATION:  <200     mg/dL   Desirable  200-239  mg/dL   Borderline High  >=240    mg/dL   High 02/11/2007   Lab Results  Component Value Date   HDL 44 02/11/2007   Lab Results  Component Value Date   LDLCALC  Value: 82        Total Cholesterol/HDL:CHD Risk Coronary Heart Disease Risk Table                     Men   Women  1/2 Average Risk   3.4   3.3 02/11/2007   Lab Results  Component Value Date   TRIG 60 02/11/2007   Lab Results  Component Value Date   CHOLHDL 3.1 02/11/2007   No results found for this basename: LDLDIRECT       EKG:NSR, NSST, PVCs  ASSESSMENT AND PLAN:   HTN: BP better controlled.  She feels better off of spironolactone.  Will stop spironolactone and have her monitor BP.  Increase exercise to 5days/week for 30 minutes daily.  Obesity:  S/p lap band  and then repeat adjustment.  Lost weight after gaining everything back.   Improve diet to help with Diabetes.   CRI: Cr has been as high as 1.7 in the past.  Watch renal function since she is on Azor.  Following with nephrology.  Bradycardia: No rate slowing meds that  we can stop.  May be related to monitoring issue since she has PVCs.  Medical Rx for prior abnormal stress test in 2008. Signed:   Mina Marble, MD, [facility] 09/29/2013, 4:37 PM

## 2013-09-29 NOTE — Patient Instructions (Signed)
Your physician recommends that you continue on your current medications as directed. Please refer to the Current Medication list given to you today.  Your physician has requested that you regularly monitor and record your blood pressure readings at home. Please use the same machine at the same time of day to check your readings and record them. Call if BP is consistently above 140/90.  Your physician wants you to follow-up in: 6 months with Dr. Irish Lack. You will receive a reminder letter in the mail two months in advance. If you don't receive a letter, please call our office to schedule the follow-up appointment.

## 2013-10-04 ENCOUNTER — Encounter: Payer: Self-pay | Admitting: Podiatry

## 2013-10-04 ENCOUNTER — Ambulatory Visit (INDEPENDENT_AMBULATORY_CARE_PROVIDER_SITE_OTHER): Payer: BC Managed Care – PPO | Admitting: Podiatry

## 2013-10-04 VITALS — BP 176/77 | HR 70 | Ht 63.0 in | Wt 199.0 lb

## 2013-10-04 DIAGNOSIS — M21969 Unspecified acquired deformity of unspecified lower leg: Secondary | ICD-10-CM

## 2013-10-04 DIAGNOSIS — M216X9 Other acquired deformities of unspecified foot: Secondary | ICD-10-CM

## 2013-10-04 DIAGNOSIS — M659 Synovitis and tenosynovitis, unspecified: Secondary | ICD-10-CM

## 2013-10-04 DIAGNOSIS — M79609 Pain in unspecified limb: Secondary | ICD-10-CM

## 2013-10-04 NOTE — Patient Instructions (Signed)
Follow up on left ankle pain. Improvement noted with ankle brace. Both feet casted for custom orthotics. Will contact patient when they are ready.

## 2013-10-04 NOTE — Progress Notes (Signed)
Subjective: Follow up on Left ankle pain. Been using ankle brace. It must been helping. She was noticing some twinge in her left knee only without ankle pain while using the ankle brace. She also wears sandals with orthotic inserts during the summer.   Objective:  Varicose veins bilateral.  Hypermobile first ray left.  STJ hyperpronation left.  All pedal pulses are palpable.  Mild subjective numbness along the ball of foot bilateral.  Radiographic examination reveal short first ray bilateral L>R, STJ hyperpronation on left, with normal first ray angulation on lateral view. No increase in lateral deviation angle of Calcaneocuboid angle. No acute changes in osseous or articular surfaces.  Conclusion of radiographic finding is Short first ray with hyper pronating STJ left foot.   Assessment: Tenosynovitis left ankle.  Hypermobile first ray with STJ hyperpronation.   Plan: Reviewed radiographic findings.  May benefit from custom orthotics.  Both feet casted for Orthotics.

## 2013-10-12 NOTE — Telephone Encounter (Signed)
Please close encounter

## 2013-10-31 ENCOUNTER — Encounter: Payer: Self-pay | Admitting: Cardiology

## 2013-11-03 ENCOUNTER — Encounter: Payer: 59 | Admitting: Interventional Cardiology

## 2013-11-25 ENCOUNTER — Ambulatory Visit (INDEPENDENT_AMBULATORY_CARE_PROVIDER_SITE_OTHER): Payer: BC Managed Care – PPO | Admitting: Podiatry

## 2013-11-25 ENCOUNTER — Encounter: Payer: Self-pay | Admitting: Podiatry

## 2013-11-25 VITALS — Ht 63.0 in | Wt 192.0 lb

## 2013-11-25 DIAGNOSIS — M659 Synovitis and tenosynovitis, unspecified: Secondary | ICD-10-CM

## 2013-11-25 NOTE — Progress Notes (Signed)
Subjective: 63 year old female presents for one month orthotic check.  Stated that she is doing fine. No foot pain. She has kept orthotics in house sandals.  Still has some dull feeling or unusual feeling in instep of left foot.  She has lost over 100lbs.  Assessment: Improved tenosynovitis. Loss of fat pad plantar surface L>R.  Plan: Continue to use orthotics in daily outdoor shoes.  Use shoes with enough cushion.

## 2013-11-25 NOTE — Patient Instructions (Signed)
Doing well with orthotics.  Need to use more padding in everyday shoes.  Return as needed.

## 2013-12-23 ENCOUNTER — Other Ambulatory Visit: Payer: Self-pay | Admitting: *Deleted

## 2013-12-23 DIAGNOSIS — I83813 Varicose veins of bilateral lower extremities with pain: Secondary | ICD-10-CM

## 2014-02-06 ENCOUNTER — Encounter: Payer: Self-pay | Admitting: Vascular Surgery

## 2014-02-07 ENCOUNTER — Ambulatory Visit (HOSPITAL_COMMUNITY)
Admission: RE | Admit: 2014-02-07 | Discharge: 2014-02-07 | Disposition: A | Discharge status: Home / Self Care | Payer: BC Managed Care – PPO | Source: Ambulatory Visit | Attending: Vascular Surgery | Admitting: Vascular Surgery

## 2014-02-07 ENCOUNTER — Encounter: Payer: Self-pay | Admitting: Vascular Surgery

## 2014-02-07 ENCOUNTER — Ambulatory Visit (INDEPENDENT_AMBULATORY_CARE_PROVIDER_SITE_OTHER): Payer: BC Managed Care – PPO | Admitting: Vascular Surgery

## 2014-02-07 VITALS — BP 178/70 | HR 67 | Resp 16 | Ht 63.0 in | Wt 189.0 lb

## 2014-02-07 DIAGNOSIS — I83893 Varicose veins of bilateral lower extremities with other complications: Secondary | ICD-10-CM

## 2014-02-07 DIAGNOSIS — I83813 Varicose veins of bilateral lower extremities with pain: Secondary | ICD-10-CM | POA: Diagnosis present

## 2014-02-07 NOTE — Progress Notes (Signed)
Subjective:     Patient ID: Kathleen [name], female   DOB: 02-Sep-1950, 63 y.o.   MRN: [id number]  HPI this 63 year old female is evaluated for bilateral varicose veins which are painful. She has had a bleeding episode in the medial aspect of her left calf in the past. She has no history of stasis ulcers, thrombophlebitis, DVT, or pulmonary embolus. She does not were elastic compression stockings. She has aching throbbing and burning discomfort in both thighs and lateral calf areas which worsen the more she stands. This is affecting her daily living.  Past Medical History  Diagnosis Date  . Diabetes mellitus   . Hypertension   . Hyperlipidemia   . Thyroid disease   . Glaucoma   . Heart disease   . Menopause   . Mixed hyperlipidemia 09/22/2013  . OSA on CPAP   . Morbid obesity   . Pulmonary hypertension   . Right-sided heart failure   . Multinodular goiter     AND HYPERTHYROIDISM   . Proliferative retinopathy     moderate, Dr. Posey Pronto  . Trigger finger     Dr. Rip Harbour  . Proteinuria     Dr. Hassell Done  . Carotid artery plaque     mild to moderate less tan 50% stenosis on u/s 2/14    History  Substance Use Topics  . Smoking status: Former Research scientist (life sciences)  . Smokeless tobacco: Not on file  . Alcohol Use: No    Family History  Problem Relation Age of Onset  . Heart attack Father     Allergies  Allergen Reactions  . Epinephrine Hcl     Hives   . Levaquin [Levofloxacin In D5w]     angioedema    Current outpatient prescriptions: ALPHAGAN P 0.15 % ophthalmic solution, , Disp: , Rfl: ;  amLODipine-olmesartan (AZOR) 10-40 MG per tablet, Take 1 tablet by mouth daily.  , Disp: , Rfl: ;  aspirin 81 MG tablet, Take 81 mg by mouth daily.  , Disp: , Rfl: ;  atorvastatin (LIPITOR) 40 MG tablet, Take 40 mg by mouth daily.  , Disp: , Rfl: ;  azithromycin (ZITHROMAX) 250 MG tablet, , Disp: , Rfl: ;  CALCIUM PO, Take by mouth.  , Disp: , Rfl:  chlorthalidone (HYGROTON) 25 MG tablet, Take 25 mg by mouth  daily., Disp: , Rfl: ;  latanoprost (XALATAN) 0.005 % ophthalmic solution, , Disp: , Rfl: ;  LEVEMIR FLEXPEN 100 UNIT/ML SOPN, , Disp: , Rfl: ;  metFORMIN (GLUCOPHAGE) 850 MG tablet, Take 850 mg by mouth 2 (two) times daily with a meal.  , Disp: , Rfl: ;  methimazole (TAPAZOLE) 10 MG tablet, Take 5 mg by mouth daily. , Disp: , Rfl:  Multiple Vitamin (MULTIVITAMIN) capsule, Take 1 capsule by mouth daily.  , Disp: , Rfl: ;  NOVOLOG FLEXPEN 100 UNIT/ML injection, Inject 20 Units into the skin daily. , Disp: , Rfl: ;  ONE TOUCH ULTRA TEST test strip, , Disp: , Rfl: ;  spironolactone (ALDACTONE) 12.5 mg TABS tablet, Take 12.5 mg by mouth daily., Disp: , Rfl: ;  timolol (TIMOPTIC) 0.5 % ophthalmic solution, , Disp: , Rfl:  VITAMIN C, CALCIUM ASCORBATE, PO, Take by mouth.  , Disp: , Rfl:   BP 178/70 mmHg  Pulse 67  Resp 16  Ht 5\' 3"  (1.6 m)  Wt 189 lb (85.73 kg)  BMI 33.49 kg/m2  Body mass index is 33.49 kg/(m^2).         Review of Systems  denies chest pain, dyspnea on exertion, PND, orthopnea, hemoptysis. Patient does have morbid obesity. Other systems negative and complete review of systems     Objective:   Physical Exam BP 178/70 mmHg  Pulse 67  Resp 16  Ht 5\' 3"  (1.6 m)  Wt 189 lb (85.73 kg)  BMI 33.49 kg/m2  Gen.-alert and oriented x3 in no apparent distress HEENT normal for age Lungs no rhonchi or wheezing Cardiovascular regular rhythm no murmurs carotid pulses 3+ palpable no bruits audible Abdomen soft nontender no palpable masses-obese Musculoskeletal free of  major deformities Skin clear -no rashes Neurologic normal Lower extremities 3+ femoral and dorsalis pedis pulses palpable bilaterally with no edema Both legs with large ropey bulging varicose veins beginning proximally in the thighs extending laterally down the thigh to the knee area. Also some reticular veins medially particularly on the left side below the knee. No active ulceration noted.  Today I ordered bilateral  venous duplex exam which I reviewed and interpreted. There is no DVT. She does have reflux for very short segments in the great saphenous veins bilaterally which extend primarily into the anterior accessory branches and supplies these bulging varicosities.       Assessment:     Painful varicosities bilateral with gross reflux in proximal great saphenous veins and anterior accessory branches. 1 episode of bleeding left leg due to varix.    Plan:         #1 long leg elastic compression stockings 20-30 mm gradient #2 elevate legs as much as possible #3 ibuprofen daily on a regular basis for pain #4 return in 3 months-if no significant improvement then she will need #1 greater than 20 stab phlebectomy of painful varicosities left leg followed by #2 greater than 20 stab phlebectomy of painful varicosities right leg followed by #3 sclerotherapy left leg where bleeding occurred

## 2014-05-08 ENCOUNTER — Encounter: Payer: Self-pay | Admitting: Vascular Surgery

## 2014-05-09 ENCOUNTER — Encounter: Payer: Self-pay | Admitting: Vascular Surgery

## 2014-05-09 ENCOUNTER — Ambulatory Visit (INDEPENDENT_AMBULATORY_CARE_PROVIDER_SITE_OTHER): Payer: BLUE CROSS/BLUE SHIELD | Admitting: Vascular Surgery

## 2014-05-09 ENCOUNTER — Other Ambulatory Visit: Payer: Self-pay | Admitting: *Deleted

## 2014-05-09 VITALS — BP 166/75 | HR 59 | Resp 16 | Ht 63.0 in | Wt 197.0 lb

## 2014-05-09 DIAGNOSIS — I83893 Varicose veins of bilateral lower extremities with other complications: Secondary | ICD-10-CM

## 2014-05-09 NOTE — Progress Notes (Signed)
Subjective:     Patient ID: Kathleen [name], female   DOB: February 25, 1951, 64 y.o.   MRN: [id number]  HPI this 64 year old female returns for continued follow-up regarding her painful varicosities in both lower extremities. She has been shown to have reflux in these bulging varicosities primarily in the anterior accessory branch in the left leg. She has reflux along the lateral aspect of the right leg in the painful varicosities as well. She has tried long-leg elastic compression stockings 20-30 millimeter gradient as well as elevation ibuprofen with no improvement in her symptoms. She has had bleeding in the left leg through a prominent varix below the knee medially. She does have chronic 1+ edema. Her symptoms are affecting her daily living and are not improved by conservative measures.  Past Medical History  Diagnosis Date  . Diabetes mellitus   . Hypertension   . Hyperlipidemia   . Thyroid disease   . Glaucoma   . Heart disease   . Menopause   . Mixed hyperlipidemia 09/22/2013  . OSA on CPAP   . Morbid obesity   . Pulmonary hypertension   . Right-sided heart failure   . Multinodular goiter     AND HYPERTHYROIDISM   . Proliferative retinopathy     moderate, Dr. Posey Pronto  . Trigger finger     Dr. Rip Harbour  . Proteinuria     Dr. Hassell Done  . Carotid artery plaque     mild to moderate less tan 50% stenosis on u/s 2/14    History  Substance Use Topics  . Smoking status: Former Research scientist (life sciences)  . Smokeless tobacco: Never Used  . Alcohol Use: No    Family History  Problem Relation Age of Onset  . Heart attack Father     Allergies  Allergen Reactions  . Epinephrine Hcl     Hives   . Levaquin [Levofloxacin In D5w]     angioedema     Current outpatient prescriptions:  .  ALPHAGAN P 0.15 % ophthalmic solution, , Disp: , Rfl:  .  amLODipine-olmesartan (AZOR) 10-40 MG per tablet, Take 1 tablet by mouth daily.  , Disp: , Rfl:  .  aspirin 81 MG tablet, Take 81 mg by mouth daily.  , Disp: , Rfl:   .  atorvastatin (LIPITOR) 40 MG tablet, Take 40 mg by mouth daily.  , Disp: , Rfl:  .  azithromycin (ZITHROMAX) 250 MG tablet, , Disp: , Rfl:  .  CALCIUM PO, Take by mouth.  , Disp: , Rfl:  .  chlorthalidone (HYGROTON) 25 MG tablet, Take 25 mg by mouth daily., Disp: , Rfl:  .  latanoprost (XALATAN) 0.005 % ophthalmic solution, , Disp: , Rfl:  .  LEVEMIR FLEXPEN 100 UNIT/ML SOPN, , Disp: , Rfl:  .  metFORMIN (GLUCOPHAGE) 850 MG tablet, Take 850 mg by mouth 2 (two) times daily with a meal.  , Disp: , Rfl:  .  methimazole (TAPAZOLE) 10 MG tablet, Take 5 mg by mouth daily. , Disp: , Rfl:  .  Multiple Vitamin (MULTIVITAMIN) capsule, Take 1 capsule by mouth daily.  , Disp: , Rfl:  .  NOVOLOG FLEXPEN 100 UNIT/ML injection, Inject 20 Units into the skin daily. , Disp: , Rfl:  .  ONE TOUCH ULTRA TEST test strip, , Disp: , Rfl:  .  spironolactone (ALDACTONE) 12.5 mg TABS tablet, Take 12.5 mg by mouth daily., Disp: , Rfl:  .  timolol (TIMOPTIC) 0.5 % ophthalmic solution, , Disp: , Rfl:  .  VITAMIN C, CALCIUM ASCORBATE, PO, Take by mouth.  , Disp: , Rfl:   BP 166/75 mmHg  Pulse 59  Resp 16  Ht 5\' 3"  (1.6 m)  Wt 197 lb (89.359 kg)  BMI 34.91 kg/m2  Body mass index is 34.91 kg/(m^2).           Review of Systems denies chest pain, dyspnea on exertion, PND, orthopnea, hemoptysis    Objective:   Physical Exam BP 166/75 mmHg  Pulse 59  Resp 16  Ht 5\' 3"  (1.6 m)  Wt 197 lb (89.359 kg)  BMI 34.91 kg/m2  Gen. well-developed well-nourished female no apparent stress alert and oriented 3 Lungs no rhonchi or wheezing Left leg with long extensive bulging varicosities beginning in the medial anterior thigh extending laterally in the lateral calf with 1+ edema and 3+ dorsalis pedis pulse palpable. Right leg with anterior bulging varicosities lateral to the right knee into the lateral calf with 1+ edema 3+ dorsalis pedis pulse palpable.     Assessment:     Bilateral painful varicosities with  gross reflux causing pain and edema and resistant to conservative measures including long-leg elastic compression stockings, ibuprofen, and elevation. Patient needs #1 greater than 20 stab phlebectomy painful varicosities left leg followed by #2 greater than 20 stab phlebectomy painful varicosities right leg all of by #32 courses of sclerotherapy for bleeding site    Plan:     We'll proceed with precertification to perform this in the near future for this lady

## 2014-05-12 ENCOUNTER — Encounter: Payer: Self-pay | Admitting: Vascular Surgery

## 2014-05-15 ENCOUNTER — Ambulatory Visit (INDEPENDENT_AMBULATORY_CARE_PROVIDER_SITE_OTHER): Payer: BLUE CROSS/BLUE SHIELD | Admitting: Vascular Surgery

## 2014-05-15 VITALS — BP 196/80 | HR 60 | Resp 16 | Ht 63.0 in | Wt 199.0 lb

## 2014-05-15 DIAGNOSIS — I83893 Varicose veins of bilateral lower extremities with other complications: Secondary | ICD-10-CM

## 2014-05-15 NOTE — Progress Notes (Signed)
Subjective:     Patient ID: Kathleen [name], female   DOB: 11/02/1950, 64 y.o.   MRN: [id number]  HPI this 64 year old female had multiple stab phlebectomy-greater than 20 of painful varicosities left leg performed under local tumescent anesthesia. She tolerated the procedure well.   Review of Systems     Objective:   Physical Exam BP 196/80 mmHg  Pulse 60  Resp 16  Ht 5\' 3"  (1.6 m)  Wt 199 lb (90.266 kg)  BMI 35.26 kg/m2       Assessment:     Multiple stab phlebectomy-greater than 20 of painful varicosities left leg performed under local tumescent anesthesia    Plan:     Return in 2 weeks for similar procedure on contralateral right leg

## 2014-05-15 NOTE — Progress Notes (Signed)
    Stab Phlebectomy Procedure  Kathleen [name] DOB:1950/09/07  05/15/2014  Consent signed: Yes  Surgeon:J.D. Kellie Simmering  Procedure: stab phlebectomy: left leg  BP 196/80 mmHg  Pulse 60  Resp 16  Ht 5\' 3"  (1.6 m)  Wt 199 lb (90.266 kg)  BMI 35.26 kg/m2  Start time: 9am   End time: 10:10am   Tumescent Anesthesia: 250 cc 0.9% NaCl with 50 cc Lidocaine HCL with 1% Epi and 15 cc 8.4% NaHCO3  Local Anesthesia: 8 cc Lidocaine HCL and NaHCO3 (ratio 2:1)   Stab Phlebectomy: >20 Sites: Thigh and Calf  Patient tolerated procedure well: Yes  Notes:   Description of Procedure:  After marking the course of the secondary varicosities, the patient was placed on the operating table in the supine position, and the left leg was prepped and draped in sterile fashion.    The patient was then put into Trendelenburg position.  Local anesthetic was administered at the previously marked varicosities, and tumescent anesthesia was administered around the vessels.  Greater than 20 stab wounds were made using the tip of an 11 blade. And using the vein hook, the phlebectomies were performed using a hemostat to avulse the varicosities.  Adequate hemostasis was achieved, and steri strips were applied to the stab wound.      ABD pads and thigh high compression stockings were applied as well ace wraps where needed. Blood loss was less than 15 cc.  The patient ambulated out of the operating room having tolerated the procedure well.

## 2014-05-16 ENCOUNTER — Telehealth: Payer: Self-pay | Admitting: *Deleted

## 2014-05-16 NOTE — Telephone Encounter (Signed)
Pt doing well. No bleeding from stab sites. Following all instructions. Just struggling with her compression stockings.

## 2014-05-17 ENCOUNTER — Encounter: Payer: Self-pay | Admitting: Vascular Surgery

## 2014-05-25 ENCOUNTER — Encounter: Payer: Self-pay | Admitting: Vascular Surgery

## 2014-05-29 ENCOUNTER — Encounter: Payer: Self-pay | Admitting: Vascular Surgery

## 2014-05-29 ENCOUNTER — Ambulatory Visit (INDEPENDENT_AMBULATORY_CARE_PROVIDER_SITE_OTHER): Payer: BLUE CROSS/BLUE SHIELD | Admitting: Vascular Surgery

## 2014-05-29 VITALS — BP 160/80 | HR 72 | Resp 16 | Ht 63.0 in | Wt 197.0 lb

## 2014-05-29 DIAGNOSIS — I83893 Varicose veins of bilateral lower extremities with other complications: Secondary | ICD-10-CM

## 2014-05-29 NOTE — Progress Notes (Signed)
Subjective:     Patient ID: Kathleen [name], female   DOB: [birth year], 64 y.o.   MRN: [id number]  HPI this 64 year old female had greater than 20 stab phlebectomy of painful varicosities right thigh and calf performed under local tumescent anesthesia and also sclerotherapy of secondary varicosities in the contralateral left leg. She tolerated the procedures well. Review of Systems     Objective:   Physical Exam BP 160/80 mmHg  Pulse 72  Resp 16  Ht 5\' 3"  (1.6 m)  Wt 197 lb (89.359 kg)  BMI 34.91 kg/m2       Assessment:     Well-tolerated multiple stab phlebectomy-greater than 20-of secondary painful varicosities right leg and sclerotherapy of left leg varicosities performed under local tumescent anesthesia    Plan:     Return in 2 months for final follow-up

## 2014-05-29 NOTE — Progress Notes (Signed)
Filed Vitals:   05/29/14 0848 05/29/14 0849  BP: 158/78 160/80  Pulse: 72   Resp: 16   Height: 5\' 3"  (1.6 m)   Weight: 197 lb (89.359 kg)

## 2014-05-29 NOTE — Progress Notes (Signed)
    Stab Phlebectomy Procedure  Kathleen [name] DOB:[birth year]  05/29/2014  Consent signed: Yes  Surgeon:J.D. Kellie Simmering  Procedure: stab phlebectomy: right leg  BP 160/80 mmHg  Pulse 72  Resp 16  Ht 5\' 3"  (1.6 m)  Wt 197 lb (89.359 kg)  BMI 34.91 kg/m2  Start time: 9   End time: 10:25   Tumescent Anesthesia: 200 cc 0.9% NaCl with 50 cc Lidocaine HCL with 1% Epi and 15 cc 8.4% NaHCO3  Local Anesthesia: 7 cc Lidocaine HCL and NaHCO3 (ratio 2:1)  Sclerotherapy: .3 %Sotradecol. Patient received a total of 6 cc  Stab Phlebectomy: >20 Sites: Thigh and Calf  Patient tolerated procedure well: Yes  Notes:   Description of Procedure:  After marking the course of the secondary varicosities, the patient was placed on the operating table in the supine position, and the right leg was prepped and draped in sterile fashion.    The patient was then put into Trendelenburg position.  Local anesthetic was administered at the previously marked varicosities, and tumescent anesthesia was administered around the vessels.  Greater than 20 stab wounds were made using the tip of an 11 blade. And using the vein hook, the phlebectomies were performed using a hemostat to avulse the varicosities.  Adequate hemostasis was achieved, and steri strips were applied to the stab wound.    Sclerotherapy was performed to bilateral varicosities using 6  cc .3% Sotradecol foam via a 27g butterfly needle.  ABD pads and thigh high compression stockings were applied as well ace wraps where needed. Blood loss was less than 15 cc.  The patient ambulated out of the operating room having tolerated the procedure well.

## 2014-05-30 ENCOUNTER — Telehealth: Payer: Self-pay | Admitting: *Deleted

## 2014-05-30 NOTE — Telephone Encounter (Signed)
Doing well. No bleeding from stab sites. Aces rolling a bit but she says she is ok.

## 2014-05-31 ENCOUNTER — Encounter: Payer: Self-pay | Admitting: Vascular Surgery

## 2014-07-28 ENCOUNTER — Encounter: Payer: Self-pay | Admitting: Vascular Surgery

## 2014-08-01 ENCOUNTER — Ambulatory Visit (INDEPENDENT_AMBULATORY_CARE_PROVIDER_SITE_OTHER): Payer: Self-pay | Admitting: Vascular Surgery

## 2014-08-01 ENCOUNTER — Encounter: Payer: Self-pay | Admitting: Vascular Surgery

## 2014-08-01 VITALS — BP 190/83 | HR 58 | Resp 18 | Ht 63.0 in | Wt 195.0 lb

## 2014-08-01 DIAGNOSIS — I83893 Varicose veins of bilateral lower extremities with other complications: Secondary | ICD-10-CM

## 2014-08-01 DIAGNOSIS — I83899 Varicose veins of unspecified lower extremities with other complications: Secondary | ICD-10-CM | POA: Insufficient documentation

## 2014-08-01 NOTE — Progress Notes (Signed)
Subjective:     Patient ID: Kathleen [name], female   DOB: 1951-02-26, 64 y.o.   MRN: [id number]  HPI 64 year old female returns for final follow-up regarding her multiple stab phlebectomy of the right leg and sclerotherapy of both legs for painful varicosities. She is very pleased with her result. She denies any aching and throbbing discomfort or distal edema. She has had no chest pain shortness of breath or hemoptysis.   Review of Systems     Objective:   Physical Exam BP 190/83 mmHg  Pulse 58  Resp 18  Ht 5\' 3"  (1.6 m)  Wt 195 lb (88.451 kg)  BMI 34.55 kg/m2  Gen. well-developed well-nourished female stress alert and oriented 3 Lungs no rhonchi or wheezing Right leg with nicely healed stab phlebectomy sites which are difficult to identify. Areas of sclerotherapy have progressed nicely. No distal edema noted. Left leg with good result from sclerotherapy. No bulging varicosities noted.     Assessment:     Good result following stab phlebectomy right leg and bilateral lower extremity sclerotherapy of painful varicosities    Plan:     Return to see me on when necessary basis

## 2014-08-01 NOTE — Progress Notes (Signed)
Filed Vitals:   08/01/14 0840 08/01/14 0841  BP: 162/69 190/83  Pulse: 53 58  Resp: 16 18  Height: 5\' 3"  (1.6 m)   Weight: 195 lb (88.451 kg)

## 2015-01-04 ENCOUNTER — Ambulatory Visit (HOSPITAL_COMMUNITY)
Admission: RE | Admit: 2015-01-04 | Discharge: 2015-01-04 | Disposition: A | Discharge status: Home / Self Care | Payer: BLUE CROSS/BLUE SHIELD | Source: Ambulatory Visit | Attending: Surgery | Admitting: Surgery

## 2015-01-04 ENCOUNTER — Other Ambulatory Visit (HOSPITAL_COMMUNITY): Payer: Self-pay | Admitting: Surgery

## 2015-01-04 ENCOUNTER — Other Ambulatory Visit: Payer: Self-pay | Admitting: Surgery

## 2015-01-04 DIAGNOSIS — R635 Abnormal weight gain: Secondary | ICD-10-CM | POA: Diagnosis present

## 2015-01-04 DIAGNOSIS — Z9884 Bariatric surgery status: Secondary | ICD-10-CM

## 2015-02-16 ENCOUNTER — Ambulatory Visit: Payer: Self-pay | Admitting: Surgery

## 2015-02-16 NOTE — Progress Notes (Signed)
No orders in epic for surgical procedure. Please place orders. Pt has PAT appt on Tuesday February 20, 2015. Thanks.

## 2015-02-20 ENCOUNTER — Encounter (HOSPITAL_COMMUNITY): Payer: Self-pay

## 2015-02-20 ENCOUNTER — Encounter (HOSPITAL_COMMUNITY)
Admission: RE | Admit: 2015-02-20 | Discharge: 2015-02-20 | Disposition: A | Discharge status: Home / Self Care | Payer: BLUE CROSS/BLUE SHIELD | Source: Ambulatory Visit | Attending: Surgery | Admitting: Surgery

## 2015-02-20 DIAGNOSIS — Z01812 Encounter for preprocedural laboratory examination: Secondary | ICD-10-CM | POA: Diagnosis not present

## 2015-02-20 DIAGNOSIS — Z01810 Encounter for preprocedural cardiovascular examination: Secondary | ICD-10-CM | POA: Insufficient documentation

## 2015-02-20 DIAGNOSIS — K9509 Other complications of gastric band procedure: Secondary | ICD-10-CM | POA: Insufficient documentation

## 2015-02-20 HISTORY — DX: Other complications of anesthesia, initial encounter: T88.59XA

## 2015-02-20 HISTORY — DX: Adverse effect of unspecified anesthetic, initial encounter: T41.45XA

## 2015-02-20 LAB — COMPREHENSIVE METABOLIC PANEL
ALBUMIN: 3.8 g/dL (ref 3.5–5.0)
ALT: 18 U/L (ref 14–54)
ANION GAP: 10 (ref 5–15)
AST: 35 U/L (ref 15–41)
Alkaline Phosphatase: 74 U/L (ref 38–126)
BILIRUBIN TOTAL: 1.1 mg/dL (ref 0.3–1.2)
BUN: 45 mg/dL — ABNORMAL HIGH (ref 6–20)
CHLORIDE: 103 mmol/L (ref 101–111)
CO2: 31 mmol/L (ref 22–32)
Calcium: 9.9 mg/dL (ref 8.9–10.3)
Creatinine, Ser: 1.04 mg/dL — ABNORMAL HIGH (ref 0.44–1.00)
GFR calc Af Amer: >60 mL/min (ref >60)
GFR, EST NON AFRICAN AMERICAN: 56 mL/min — AB (ref >60)
Glucose, Bld: 66 mg/dL (ref 65–99)
POTASSIUM: 4.2 mmol/L (ref 3.5–5.1)
Sodium: 144 mmol/L (ref 135–145)
TOTAL PROTEIN: 7 g/dL (ref 6.5–8.1)

## 2015-02-20 LAB — CBC WITH DIFFERENTIAL/PLATELET
BASOS ABS: 0.1 10*3/uL (ref 0.0–0.1)
BASOS PCT: 1 %
Eosinophils Absolute: 0.3 10*3/uL (ref 0.0–0.7)
Eosinophils Relative: 4 %
HEMATOCRIT: 36.8 % (ref 36.0–46.0)
Hemoglobin: 11.6 g/dL — ABNORMAL LOW (ref 12.0–15.0)
LYMPHS PCT: 20 %
Lymphs Abs: 1.5 10*3/uL (ref 0.7–4.0)
MCH: 29.1 pg (ref 26.0–34.0)
MCHC: 31.5 g/dL (ref 30.0–36.0)
MCV: 92.2 fL (ref 78.0–100.0)
Monocytes Absolute: 0.8 10*3/uL (ref 0.1–1.0)
Monocytes Relative: 11 %
NEUTROS ABS: 5 10*3/uL (ref 1.7–7.7)
Neutrophils Relative %: 64 %
PLATELETS: 319 10*3/uL (ref 150–400)
RBC: 3.99 MIL/uL (ref 3.87–5.11)
RDW: 14.4 % (ref 11.5–15.5)
WBC: 7.7 10*3/uL (ref 4.0–10.5)

## 2015-02-20 NOTE — Progress Notes (Signed)
Stress test per epic 01/21/2007 LOV note per cardiology / Dr Irish Lack 09/29/2013/epic

## 2015-02-20 NOTE — Progress Notes (Signed)
CMP results in epic per PAT visit 02/20/2015 sent to Dr Hassell Done

## 2015-02-20 NOTE — Patient Instructions (Signed)
Kathleen [name]  02/20/2015   Your procedure is scheduled on: Tuesday February 27, 2015   Report to [facility]  Entrance take [place]  elevators to 3rd floor to  [place] at 11:30 AM.  Call this number if you have problems the morning of surgery [phone]   Remember: ONLY 1 PERSON MAY GO WITH YOU TO SHORT STAY TO GET  READY MORNING OF [place].  Do not eat food After Midnight but may take clear liquids till 7:30 am day of surgery then nothing by mouth.      Take these medicines the morning of surgery with A SIP OF WATER: Amoldipine (Norvasc); Alphagan eye drops (bring bottle with you day of surgery); Refresh eye drops (bring with you day of surgery); Timolol eye drops (bring with you day of surgery);  Nasacort nasal spray if needed (bring with you day of surgery);  TAKE 1/2 DOSE OF INSULIN NIGH PRIOR TO SURGERY  DO NOT TAKE ANY DIABETIC MEDICATIONS DAY OF YOUR SURGERY                               You may not have any metal on your body including hair pins and              piercings  Do not wear jewelry, make-up, lotions, powders or perfumes, deodorant             Do not wear nail polish.  Do not shave  48 hours prior to surgery.              Do not bring valuables to the hospital. [place].  Contacts, dentures or bridgework may not be worn into surgery.  Leave suitcase in the car. After surgery it may be brought to your room.   _____________________________________________________________________             [facility] - Preparing for Surgery Before surgery, you can play an important role.  Because skin is not sterile, your skin needs to be as free of germs as possible.  You can reduce the number of germs on your skin by washing with CHG (chlorahexidine gluconate) soap before surgery.  CHG is an antiseptic cleaner which kills germs and bonds with the skin to continue killing germs even  after washing. Please DO NOT use if you have an allergy to CHG or antibacterial soaps.  If your skin becomes reddened/irritated stop using the CHG and inform your nurse when you arrive at Short Stay. Do not shave (including legs and underarms) for at least 48 hours prior to the first CHG shower.  You may shave your face/neck. Please follow these instructions carefully:  1.  Shower with CHG Soap the night before surgery and the  morning of Surgery.  2.  If you choose to wash your hair, wash your hair first as usual with your  normal  shampoo.  3.  After you shampoo, rinse your hair and body thoroughly to remove the  shampoo.                           4.  Use CHG as you would any other liquid soap.  You can  apply chg directly  to the skin and wash                       Gently with a scrungie or clean washcloth.  5.  Apply the CHG Soap to your body ONLY FROM THE NECK DOWN.   Do not use on face/ open                           Wound or open sores. Avoid contact with eyes, ears mouth and genitals (private parts).                       Wash face,  Genitals (private parts) with your normal soap.             6.  Wash thoroughly, paying special attention to the area where your surgery  will be performed.  7.  Thoroughly rinse your body with warm water from the neck down.  8.  DO NOT shower/wash with your normal soap after using and rinsing off  the CHG Soap.                9.  Pat yourself dry with a clean towel.            10.  Wear clean pajamas.            11.  Place clean sheets on your bed the night of your first shower and do not  sleep with pets. Day of Surgery : Do not apply any lotions/deodorants the morning of surgery.  Please wear clean clothes to the hospital/surgery center.  FAILURE TO FOLLOW THESE INSTRUCTIONS MAY RESULT IN THE CANCELLATION OF YOUR SURGERY PATIENT SIGNATURE_________________________________  NURSE  SIGNATURE__________________________________  ________________________________________________________________________    CLEAR LIQUID DIET   Foods Allowed                                                                     Foods Excluded  Coffee and tea, regular and decaf                             liquids that you cannot  Plain Jell-O in any flavor                                             see through such as: Fruit ices (not with fruit pulp)                                     milk, soups, orange juice  Iced Popsicles                                    All solid food Carbonated beverages, regular and diet  Cranberry, grape and apple juices Sports drinks like Gatorade Lightly seasoned clear broth or consume(fat free) Sugar, honey syrup  Sample Menu Breakfast                                Lunch                                     Supper Cranberry juice                    Beef broth                            Chicken broth Jell-O                                     Grape juice                           Apple juice Coffee or tea                        Jell-O                                      Popsicle                                                Coffee or tea                        Coffee or tea  _____________________________________________________________________

## 2015-02-21 LAB — HEMOGLOBIN A1C
HEMOGLOBIN A1C: 7.3 % — AB (ref 4.8–5.6)
MEAN PLASMA GLUCOSE: 163 mg/dL

## 2015-02-21 NOTE — Progress Notes (Signed)
Dr Delma Post / anesthesia reviewed EKG per PAT visit 02/20/2015. No orders given. Anesthesia to see pt day of surgery.

## 2015-02-21 NOTE — Progress Notes (Signed)
A1C results in epic per PAT visit 02/20/2015 sent to Dr Hassell Done

## 2015-02-27 ENCOUNTER — Encounter (HOSPITAL_COMMUNITY): Payer: Self-pay | Admitting: *Deleted

## 2015-02-27 ENCOUNTER — Observation Stay (HOSPITAL_COMMUNITY)
Admission: RE | Admit: 2015-02-27 | Discharge: 2015-02-28 | Disposition: A | Discharge status: Home / Self Care | Payer: BLUE CROSS/BLUE SHIELD | Source: Ambulatory Visit | Attending: Surgery | Admitting: Surgery

## 2015-02-27 ENCOUNTER — Ambulatory Visit (HOSPITAL_COMMUNITY): Payer: BLUE CROSS/BLUE SHIELD | Admitting: Anesthesiology

## 2015-02-27 ENCOUNTER — Encounter (HOSPITAL_COMMUNITY)
Admission: RE | Disposition: A | Discharge status: Home / Self Care | Payer: Self-pay | Source: Ambulatory Visit | Attending: Surgery

## 2015-02-27 DIAGNOSIS — I10 Essential (primary) hypertension: Secondary | ICD-10-CM | POA: Diagnosis not present

## 2015-02-27 DIAGNOSIS — E119 Type 2 diabetes mellitus without complications: Secondary | ICD-10-CM | POA: Diagnosis not present

## 2015-02-27 DIAGNOSIS — G4730 Sleep apnea, unspecified: Secondary | ICD-10-CM | POA: Diagnosis not present

## 2015-02-27 DIAGNOSIS — T85598A Other mechanical complication of other gastrointestinal prosthetic devices, implants and grafts, initial encounter: Principal | ICD-10-CM | POA: Insufficient documentation

## 2015-02-27 DIAGNOSIS — E0590 Thyrotoxicosis, unspecified without thyrotoxic crisis or storm: Secondary | ICD-10-CM | POA: Insufficient documentation

## 2015-02-27 DIAGNOSIS — Z9884 Bariatric surgery status: Secondary | ICD-10-CM

## 2015-02-27 DIAGNOSIS — Z87891 Personal history of nicotine dependence: Secondary | ICD-10-CM | POA: Insufficient documentation

## 2015-02-27 DIAGNOSIS — J449 Chronic obstructive pulmonary disease, unspecified: Secondary | ICD-10-CM | POA: Insufficient documentation

## 2015-02-27 DIAGNOSIS — I739 Peripheral vascular disease, unspecified: Secondary | ICD-10-CM | POA: Diagnosis not present

## 2015-02-27 HISTORY — PX: GASTRIC BANDING PORT REVISION: SHX5246

## 2015-02-27 LAB — BASIC METABOLIC PANEL
Anion gap: 8 (ref 5–15)
BUN: 39 mg/dL — ABNORMAL HIGH (ref 6–20)
CHLORIDE: 106 mmol/L (ref 101–111)
CO2: 29 mmol/L (ref 22–32)
CREATININE: 1.37 mg/dL — AB (ref 0.44–1.00)
Calcium: 8.9 mg/dL (ref 8.9–10.3)
GFR, EST AFRICAN AMERICAN: 46 mL/min — AB (ref >60)
GFR, EST NON AFRICAN AMERICAN: 40 mL/min — AB (ref >60)
Glucose, Bld: 87 mg/dL (ref 65–99)
POTASSIUM: 3.9 mmol/L (ref 3.5–5.1)
SODIUM: 143 mmol/L (ref 135–145)

## 2015-02-27 LAB — MAGNESIUM: MAGNESIUM: 2 mg/dL (ref 1.7–2.4)

## 2015-02-27 LAB — GLUCOSE, CAPILLARY
GLUCOSE-CAPILLARY: 76 mg/dL (ref 65–99)
GLUCOSE-CAPILLARY: 237 mg/dL — AB (ref 65–99)
Glucose-Capillary: 78 mg/dL (ref 65–99)

## 2015-02-27 LAB — PHOSPHORUS: PHOSPHORUS: 4.2 mg/dL (ref 2.5–4.6)

## 2015-02-27 SURGERY — GASTRIC BANDING PORT REVISION
Anesthesia: General | Site: Abdomen

## 2015-02-27 MED ORDER — SODIUM CHLORIDE 0.9 % IJ SOLN
INTRAMUSCULAR | Status: AC
  Filled 2015-02-27: qty 10

## 2015-02-27 MED ORDER — SODIUM CHLORIDE 0.9 % IJ SOLN
INTRAMUSCULAR | Status: AC
  Filled 2015-02-27: qty 50

## 2015-02-27 MED ORDER — FENTANYL CITRATE (PF) 250 MCG/5ML IJ SOLN
INTRAMUSCULAR | Status: AC
  Filled 2015-02-27: qty 5

## 2015-02-27 MED ORDER — DEXAMETHASONE SODIUM PHOSPHATE 10 MG/ML IJ SOLN
INTRAMUSCULAR | Status: DC | PRN
  Administered 2015-02-27: 10 mg via INTRAVENOUS

## 2015-02-27 MED ORDER — HYDROCODONE-ACETAMINOPHEN 5-325 MG PO TABS: 1.0000 | ORAL_TABLET | ORAL | Status: DC | PRN

## 2015-02-27 MED ORDER — SODIUM CHLORIDE 0.9 % IV SOLN: 250.0000 mL | INTRAVENOUS | Status: DC | PRN

## 2015-02-27 MED ORDER — SODIUM CHLORIDE 0.9 % IJ SOLN: 3.0000 mL | INTRAMUSCULAR | Status: DC | PRN

## 2015-02-27 MED ORDER — KCL IN DEXTROSE-NACL 20-5-0.45 MEQ/L-%-% IV SOLN
INTRAVENOUS | Status: DC
  Administered 2015-02-27: 19:00:00 via INTRAVENOUS
  Filled 2015-02-27 (×2): qty 1000

## 2015-02-27 MED ORDER — ROCURONIUM BROMIDE 100 MG/10ML IV SOLN
INTRAVENOUS | Status: AC
  Filled 2015-02-27: qty 1

## 2015-02-27 MED ORDER — ONDANSETRON HCL 4 MG/2ML IJ SOLN
INTRAMUSCULAR | Status: DC | PRN
  Administered 2015-02-27: 4 mg via INTRAVENOUS

## 2015-02-27 MED ORDER — HEPARIN SODIUM (PORCINE) 5000 UNIT/ML IJ SOLN
5000.0000 [IU] | INTRAMUSCULAR | Status: AC
Start: 2015-02-27 — End: 2015-02-27
  Administered 2015-02-27: 5000 [IU] via SUBCUTANEOUS
  Filled 2015-02-27: qty 1

## 2015-02-27 MED ORDER — PROPOFOL 10 MG/ML IV BOLUS
INTRAVENOUS | Status: AC
  Filled 2015-02-27: qty 40

## 2015-02-27 MED ORDER — SUGAMMADEX SODIUM 200 MG/2ML IV SOLN
INTRAVENOUS | Status: AC
  Filled 2015-02-27: qty 2

## 2015-02-27 MED ORDER — HYDROMORPHONE HCL 1 MG/ML IJ SOLN: 0.2500 mg | INTRAMUSCULAR | Status: DC | PRN

## 2015-02-27 MED ORDER — 0.9 % SODIUM CHLORIDE (POUR BTL) OPTIME
TOPICAL | Status: DC | PRN
  Administered 2015-02-27: 1000 mL

## 2015-02-27 MED ORDER — ROCURONIUM BROMIDE 100 MG/10ML IV SOLN
INTRAVENOUS | Status: DC | PRN
  Administered 2015-02-27: 30 mg via INTRAVENOUS

## 2015-02-27 MED ORDER — DEXTROSE 5 % IV SOLN
INTRAVENOUS | Status: AC
  Filled 2015-02-27: qty 2

## 2015-02-27 MED ORDER — CHLORHEXIDINE GLUCONATE CLOTH 2 % EX PADS: 6.0000 | MEDICATED_PAD | Freq: Once | CUTANEOUS | Status: DC

## 2015-02-27 MED ORDER — LIDOCAINE HCL (CARDIAC) 20 MG/ML IV SOLN
INTRAVENOUS | Status: AC
  Filled 2015-02-27: qty 5

## 2015-02-27 MED ORDER — SODIUM CHLORIDE 0.9 % IJ SOLN
3.0000 mL | Freq: Two times a day (BID) | INTRAMUSCULAR | Status: DC
  Administered 2015-02-27: 3 mL via INTRAVENOUS

## 2015-02-27 MED ORDER — DEXAMETHASONE SODIUM PHOSPHATE 10 MG/ML IJ SOLN
INTRAMUSCULAR | Status: AC
  Filled 2015-02-27: qty 1

## 2015-02-27 MED ORDER — ACETAMINOPHEN 325 MG PO TABS
650.0000 mg | ORAL_TABLET | ORAL | Status: DC | PRN
  Administered 2015-02-27: 650 mg via ORAL
  Filled 2015-02-27: qty 2

## 2015-02-27 MED ORDER — SUCCINYLCHOLINE CHLORIDE 20 MG/ML IJ SOLN
INTRAMUSCULAR | Status: DC | PRN
  Administered 2015-02-27: 140 mg via INTRAVENOUS

## 2015-02-27 MED ORDER — SODIUM CHLORIDE 0.9 % IJ SOLN
INTRAMUSCULAR | Status: DC | PRN
  Administered 2015-02-27: 20 mL

## 2015-02-27 MED ORDER — FENTANYL CITRATE (PF) 100 MCG/2ML IJ SOLN
INTRAMUSCULAR | Status: DC | PRN
  Administered 2015-02-27 (×2): 50 ug via INTRAVENOUS

## 2015-02-27 MED ORDER — SUGAMMADEX SODIUM 200 MG/2ML IV SOLN
INTRAVENOUS | Status: DC | PRN
  Administered 2015-02-27: 200 mg via INTRAVENOUS

## 2015-02-27 MED ORDER — MEPERIDINE HCL 50 MG/ML IJ SOLN: 6.2500 mg | INTRAMUSCULAR | Status: DC | PRN

## 2015-02-27 MED ORDER — METHYLENE BLUE 1 % INJ SOLN
INTRAMUSCULAR | Status: AC
Start: 2015-02-27 — End: 2015-02-27
  Filled 2015-02-27: qty 10

## 2015-02-27 MED ORDER — MIDAZOLAM HCL 5 MG/5ML IJ SOLN
INTRAMUSCULAR | Status: DC | PRN
  Administered 2015-02-27: 2 mg via INTRAVENOUS

## 2015-02-27 MED ORDER — BUPIVACAINE LIPOSOME 1.3 % IJ SUSP
20.0000 mL | Freq: Once | INTRAMUSCULAR | Status: DC
  Filled 2015-02-27: qty 20

## 2015-02-27 MED ORDER — DEXTROSE 5 % IV SOLN
2.0000 g | INTRAVENOUS | Status: AC
  Administered 2015-02-27: 2 g via INTRAVENOUS

## 2015-02-27 MED ORDER — ONDANSETRON HCL 4 MG/2ML IJ SOLN
INTRAMUSCULAR | Status: AC
  Filled 2015-02-27: qty 2

## 2015-02-27 MED ORDER — PROPOFOL 10 MG/ML IV BOLUS
INTRAVENOUS | Status: DC | PRN
  Administered 2015-02-27: 250 mg via INTRAVENOUS

## 2015-02-27 MED ORDER — INSULIN ASPART 100 UNIT/ML ~~LOC~~ SOLN
0.0000 [IU] | SUBCUTANEOUS | Status: DC
  Administered 2015-02-27: 3 [IU] via SUBCUTANEOUS
  Administered 2015-02-28: 5 [IU] via SUBCUTANEOUS
  Administered 2015-02-28: 3 [IU] via SUBCUTANEOUS
  Administered 2015-02-28: 5 [IU] via SUBCUTANEOUS

## 2015-02-27 MED ORDER — LACTATED RINGERS IV SOLN
INTRAVENOUS | Status: DC
  Administered 2015-02-27: 15:00:00 via INTRAVENOUS
  Administered 2015-02-27: 1000 mL via INTRAVENOUS

## 2015-02-27 MED ORDER — ACETAMINOPHEN 650 MG RE SUPP: 650.0000 mg | RECTAL | Status: DC | PRN

## 2015-02-27 MED ORDER — LIDOCAINE HCL (CARDIAC) 20 MG/ML IV SOLN
INTRAVENOUS | Status: DC | PRN
  Administered 2015-02-27: 100 mg via INTRAVENOUS

## 2015-02-27 MED ORDER — PROMETHAZINE HCL 25 MG/ML IJ SOLN: 6.2500 mg | INTRAMUSCULAR | Status: DC | PRN

## 2015-02-27 MED ORDER — OXYCODONE HCL 5 MG PO TABS: 5.0000 mg | ORAL_TABLET | ORAL | Status: DC | PRN

## 2015-02-27 MED ORDER — MIDAZOLAM HCL 2 MG/2ML IJ SOLN
INTRAMUSCULAR | Status: AC
  Filled 2015-02-27: qty 2

## 2015-02-27 SURGICAL SUPPLY — 38 items
ACC PORT GSTRC BAND STD KT HI (Band) ×1 IMPLANT
ADH SKN CLS APL DERMABOND .7 (GAUZE/BANDAGES/DRESSINGS) ×1
APL SKNCLS STERI-STRIP NONHPOA (GAUZE/BANDAGES/DRESSINGS)
BENZOIN TINCTURE PRP APPL 2/3 (GAUZE/BANDAGES/DRESSINGS) IMPLANT
BLADE HEX COATED 2.75 (ELECTRODE) ×3 IMPLANT
CLOSURE WOUND 1/2 X4 (GAUZE/BANDAGES/DRESSINGS)
COVER SURGICAL LIGHT HANDLE (MISCELLANEOUS) ×3 IMPLANT
DECANTER SPIKE VIAL GLASS SM (MISCELLANEOUS) ×6 IMPLANT
DERMABOND ADVANCED (GAUZE/BANDAGES/DRESSINGS) ×2
DERMABOND ADVANCED .7 DNX12 (GAUZE/BANDAGES/DRESSINGS) IMPLANT
DRAPE LAPAROSCOPIC ABDOMINAL (DRAPES) ×3 IMPLANT
ELECT REM PT RETURN 9FT ADLT (ELECTROSURGICAL) ×3
ELECTRODE REM PT RTRN 9FT ADLT (ELECTROSURGICAL) ×1 IMPLANT
GLOVE BIOGEL M 8.0 STRL (GLOVE) ×3 IMPLANT
GLOVE BIOGEL PI IND STRL 7.5 (GLOVE) IMPLANT
GLOVE BIOGEL PI INDICATOR 7.5 (GLOVE) ×2
GLOVE SURG SS PI 7.0 STRL IVOR (GLOVE) ×2 IMPLANT
GLOVE SURG SS PI 7.5 STRL IVOR (GLOVE) ×2 IMPLANT
GOWN SPEC L4 XLG W/TWL (GOWN DISPOSABLE) ×3 IMPLANT
GOWN STRL REUS W/TWL LRG LVL3 (GOWN DISPOSABLE) ×3 IMPLANT
GOWN STRL REUS W/TWL XL LVL3 (GOWN DISPOSABLE) ×9 IMPLANT
KIT ACCESS PORT VG (Band) ×2 IMPLANT
KIT BASIN OR (CUSTOM PROCEDURE TRAY) ×3 IMPLANT
LIQUID BAND (GAUZE/BANDAGES/DRESSINGS) ×2 IMPLANT
MESH HERNIA 1X4 RECT BARD (Mesh General) IMPLANT
MESH HERNIA BARD 1X4 (Mesh General) ×2 IMPLANT
NEEDLE HYPO 22GX1.5 SAFETY (NEEDLE) ×3 IMPLANT
NS IRRIG 1000ML POUR BTL (IV SOLUTION) ×3 IMPLANT
PACK GENERAL/GYN (CUSTOM PROCEDURE TRAY) ×3 IMPLANT
STAPLER VISISTAT 35W (STAPLE) IMPLANT
STRIP CLOSURE SKIN 1/2X4 (GAUZE/BANDAGES/DRESSINGS) IMPLANT
SUT PROLENE 2 0 CT2 30 (SUTURE) ×12 IMPLANT
SUT VIC AB 2-0 SH 27 (SUTURE)
SUT VIC AB 2-0 SH 27X BRD (SUTURE) IMPLANT
SUT VIC AB 4-0 SH 18 (SUTURE) ×5 IMPLANT
SYR 30ML LL (SYRINGE) ×3 IMPLANT
SYR BULB IRRIGATION 50ML (SYRINGE) IMPLANT
SYRINGE 10CC LL (SYRINGE) ×3 IMPLANT

## 2015-02-27 NOTE — Op Note (Signed)
Surgeon: Kaylyn Lim, MD, FACS  Asst:  None  Anes:  Gen.  Procedure: Explantation of lap band port with replacement  Diagnosis: Laceration at the LAP-BAND port connection to the lap band tubing with prominent leak  Complications: None  EBL:   Minimal cc  Drains: None  Description of Procedure:  The patient was taken to OR 1 at [facility].  After anesthesia was administered and the patient was prepped a timeout was performed.  The easily palpable subcutaneous port on the right was exposed by making a transverse incision and carried this down to a prominent pseudocapsule surrounding LAP-BAND port. I excised the pseudocapsule along with the underlying mesh it was attached to the port and completely extricated the port from the surrounding tissue. I then gently pulled back and found it was hung up at the location where the connector was between the port and the tubing. I gently dissected along the tubing using scissors to free this up to the actual junction. Following mobilization of found a tangential laceration and also another opening looked like the tubing junction was trying to pierce through the tubing itself. I was able to then completely extricate the tubing from the abdomen and then I cut proximal to this just above the joint junction and then completely reimplanted [place] into the L tubing. When completely connected this was tested with saline and flushed. There was it some of this gunky fluid that was consistent with serous contamination which was flushed out to where the port flushed easily. I then tucked it back into the port site and closed with  4-0 Vicryl in multiple layers.  2 mL of fluid were left in the band.  The patient tolerated the procedure well and was taken to the PACU in stable condition.     Matt B. Hassell Done, [place], [facility], [place]

## 2015-02-27 NOTE — Anesthesia Postprocedure Evaluation (Signed)
Anesthesia Post Note  Patient: Kathleen [name]  Procedure(s) Performed: Procedure(s) (LRB): GASTRIC BANDING PORT REVISION (N/A)  Patient location during evaluation: PACU Anesthesia Type: General Level of consciousness: awake and alert Pain management: pain level controlled Vital Signs Assessment: post-procedure vital signs reviewed and stable Respiratory status: spontaneous breathing, nonlabored ventilation, respiratory function stable and patient connected to nasal cannula oxygen Cardiovascular status: blood pressure returned to baseline and stable Postop Assessment: no signs of nausea or vomiting Anesthetic complications: no Comments: Occasional PVC's while in PACU. Pt is currently asymptomatic, labs are within normal limits. No further intervention at this time.     Last Vitals:  Filed Vitals:   02/27/15 1636 02/27/15 1645  BP: 154/63 164/70  Pulse: 62 45  Temp:    Resp: 15 15    Last Pain:  Filed Vitals:   02/27/15 1702  PainSc: 0-No pain                 Effie Berkshire

## 2015-02-27 NOTE — H&P (Signed)
Kathleen [name]. Marceau DOB: [birth year] Single / Language: Cleophus Molt / Race: White Female  CC  Unable to maintain fill of lapband History of Present Illness  Patient words: 64 year old female who has had difficulty in [place] [facility].  She was seen by me in early November and there was no restriction and when I filled the port, it appears that it was leaking and the port needed revision.  Lapband not working. No restriction. 4 cc of amber fluid removed. 9 cc inserted and only 4 cc retrived.  Port tubing leak  I have recommend change out of lapband port. I explained and the patient wants to procede with repair.     Vitals Ivor Costa CMA; 01/11/2015 12:42 PM) 01/11/2015 12:41 PM Weight: 219.8 lb Height: 63in Body Surface Area: 2.01 m Body Mass Index: 38.94 kg/m  Temp.: 98.47F(Temporal)  Resp.: 18 (Unlabored)  BP: 130/86 (Sitting, Left Arm, Standard)  HEENT unremarkable Chest clear Heart SR Abdomen palpable port on the right       Assessment & Plan GASTRIC BAND MALFUNCTION (K95.09) Impression: Probable port leak. Change out lapband port under general anesthesia  Matt B. Hassell Done, MD, FACS

## 2015-02-27 NOTE — Interval H&P Note (Signed)
History and Physical Interval Note:  02/27/2015 1:53 PM  Kathleen [name]  has presented today for surgery, with the diagnosis of Morbid Obesity  The various methods of treatment have been discussed with the patient and family. After consideration of risks, benefits and other options for treatment, the patient has consented to  Procedure(s): GASTRIC BANDING PORT REVISION (N/A) as a surgical intervention .  The patient's history has been reviewed, patient examined, no change in status, stable for surgery.  I have reviewed the patient's chart and labs.  Questions were answered to the patient's satisfaction.     Baldo Hufnagle B

## 2015-02-27 NOTE — Transfer of Care (Signed)
Immediate Anesthesia Transfer of Care Note  Patient: Kathleen [name]  Procedure(s) Performed: Procedure(s): GASTRIC BANDING PORT REVISION (N/A)  Patient Location: PACU  Anesthesia Type:General  Level of Consciousness:  sedated, patient cooperative and responds to stimulation  Airway & Oxygen Therapy:Patient Spontanous Breathing and Patient connected to face mask oxgen  Post-op Assessment:  Report given to PACU RN and Post -op Vital signs reviewed and stable  Post vital signs:  Reviewed and stable  Last Vitals:  Filed Vitals:   02/27/15 1135  BP: 169/73  Pulse: 58  Temp: 36.6 C  Resp: 18    Complications: No apparent anesthesia complications

## 2015-02-27 NOTE — Progress Notes (Signed)
RT placed patient on CPAP. Patient setting is auto 6-20 cmH2O. Sterile water added to water chamber for humidification. Patient is tolerating well. RT will continue to monitor.

## 2015-02-27 NOTE — Progress Notes (Signed)
Dr. Smith Robert made aware  Of patient's EKG READINGS- sinus rhythmn  With bigeminy  PVCS- ORDERS GIVEN.

## 2015-02-27 NOTE — Progress Notes (Signed)
B met, Magnesium, and Phosphorus drawn by lab.

## 2015-02-27 NOTE — Anesthesia Procedure Notes (Signed)
Procedure Name: Intubation Date/Time: 02/27/2015 2:30 PM Performed by: Lind Covert Pre-anesthesia Checklist: Patient identified, Timeout performed, Emergency Drugs available, Suction available and Patient being monitored Patient Re-evaluated:Patient Re-evaluated prior to inductionOxygen Delivery Method: Circle system utilized Preoxygenation: Pre-oxygenation with 100% oxygen Intubation Type: IV induction Laryngoscope Size: Mac and 4 Grade View: Grade II Tube type: Oral Tube size: 7.0 mm Number of attempts: 2 Airway Equipment and Method: Stylet Placement Confirmation: ETT inserted through vocal cords under direct vision,  breath sounds checked- equal and bilateral and positive ETCO2 Secured at: 21 cm Tube secured with: Tape Dental Injury: Teeth and Oropharynx as per pre-operative assessment

## 2015-02-27 NOTE — Anesthesia Preprocedure Evaluation (Signed)
Anesthesia Evaluation  Patient identified by MRN, date of birth, ID band Patient awake    Reviewed: Allergy & Precautions, NPO status , Patient's Chart, lab work & pertinent test results  History of Anesthesia Complications (+) history of anesthetic complications  Airway Mallampati: II  TM Distance: >3 FB Neck ROM: Full    Dental no notable dental hx.    Pulmonary shortness of breath, sleep apnea , COPD, former smoker,    Pulmonary exam normal breath sounds clear to auscultation       Cardiovascular hypertension, + Peripheral Vascular Disease  negative cardio ROS Normal cardiovascular exam Rhythm:Regular Rate:Normal - Carotid Bruit    Neuro/Psych negative neurological ROS  negative psych ROS   GI/Hepatic negative GI ROS, Neg liver ROS,   Endo/Other  diabetes, Type 2Hyperthyroidism   Renal/GU negative Renal ROS     Musculoskeletal negative musculoskeletal ROS (+)   Abdominal   Peds  Hematology negative hematology ROS (+)   Anesthesia Other Findings   Reproductive/Obstetrics negative OB ROS                             Anesthesia Physical Anesthesia Plan  ASA: III  Anesthesia Plan: General   Post-op Pain Management:    Induction: Intravenous  Airway Management Planned: Oral ETT  Additional Equipment:   Intra-op Plan:   Post-operative Plan: Extubation in OR  Informed Consent: I have reviewed the patients History and Physical, chart, labs and discussed the procedure including the risks, benefits and alternatives for the proposed anesthesia with the patient or authorized representative who has indicated his/her understanding and acceptance.   Dental advisory given  Plan Discussed with: CRNA  Anesthesia Plan Comments:         Anesthesia Quick Evaluation

## 2015-02-28 ENCOUNTER — Encounter (HOSPITAL_COMMUNITY): Payer: Self-pay | Admitting: Surgery

## 2015-02-28 DIAGNOSIS — T85598A Other mechanical complication of other gastrointestinal prosthetic devices, implants and grafts, initial encounter: Secondary | ICD-10-CM | POA: Diagnosis not present

## 2015-02-28 LAB — GLUCOSE, CAPILLARY
GLUCOSE-CAPILLARY: 205 mg/dL — AB (ref 65–99)
Glucose-Capillary: 267 mg/dL — ABNORMAL HIGH (ref 65–99)
Glucose-Capillary: 287 mg/dL — ABNORMAL HIGH (ref 65–99)

## 2015-02-28 NOTE — Care Management Note (Signed)
Case Management Note  Patient Details  Name: [name] MRN: [id number] Date of Birth: 25-Nov-1950  Subjective/Objective:    S/p Exchange of lapband port                Action/Plan: Discharge planning, no HH needs identified  Expected Discharge Date:  02/28/15               Expected Discharge Plan:  Home/Self Care  In-House Referral:  NA  Discharge planning Services  CM Consult  Post Acute Care Choice:  NA Choice offered to:  NA  DME Arranged:  N/A DME Agency:  NA  HH Arranged:  NA HH Agency:  NA  Status of Service:  Completed, signed off  Medicare Important Message Given:    Date Medicare IM Given:    Medicare IM give by:    Date Additional Medicare IM Given:    Additional Medicare Important Message give by:     If discussed at [place] of Stay Meetings, dates discussed:    Additional Comments:  Guadalupe Maple, RN 02/28/2015, 10:59 AM

## 2015-02-28 NOTE — Discharge Summary (Signed)
Physician Discharge Summary  Patient ID: Kathleen [name] MRN: [id number] DOB/AGE: 1950/12/24 64 y.o.  Admit date: 02/27/2015 Discharge date: 02/28/2015  Admission Diagnoses:  [place] port  Discharge Diagnoses:  same  Active Problems:   Status post gastric banding   Surgery:  Exchange of lapband port  Discharged Condition: improved  Hospital Course:   Had surgery.  Kept overnight because she had no one to stay with her.  Ready for discharge in the morning.   Consults: none  Significant Diagnostic Studies: none    Discharge Exam: Blood pressure 118/62, pulse 74, temperature 97.8 F (36.6 C), temperature source Oral, resp. rate 16, height 5\' 3"  (1.6 m), weight 105.915 kg (233 lb 8 oz), SpO2 96 %. Incision with minimal pain  Disposition:   Discharge Instructions    Ambulate hourly while awake    Complete by:  As directed      Call MD for:  difficulty breathing, headache or visual disturbances    Complete by:  As directed      Call MD for:  persistant dizziness or light-headedness    Complete by:  As directed      Call MD for:  persistant nausea and vomiting    Complete by:  As directed      Call MD for:  redness, tenderness, or signs of infection (pain, swelling, redness, odor or green/yellow discharge around incision site)    Complete by:  As directed      Call MD for:  severe uncontrolled pain    Complete by:  As directed      Call MD for:  temperature >101 F    Complete by:  As directed      Diet bariatric full liquid    Complete by:  As directed      Incentive spirometry    Complete by:  As directed   Perform hourly while awake     Suggamadex Discharge Instructions    Complete by:  As directed   During your recent anesthetic, you were given the medication sugammadex ([place]rid[place]). This medication interacts with hormonal forms of birth control (oral contraceptives and injected or implanted birth control) and may make them ineffective. IF YOU USE ANY HORMONAL  FORM OF BIRTH CONTROL, YOU MUST USE AN ADDITIONAL BARRIER BIRTH CONTROL FOR METHOD FOR SEVEN DAYS after receiving sugammadex ([place]rid[place]) or there is a chance you could become pregnant.            Medication List    TAKE these medications        ALPHAGAN P 0.15 % ophthalmic solution  Generic drug:  brimonidine  Place 1 drop into both eyes 2 (two) times daily.     amLODipine 10 MG tablet  Commonly known as:  NORVASC  Take 10 mg by mouth daily.     ascorbic acid 1000 MG tablet  Commonly known as:  VITAMIN C  Take 1,000 mg by mouth 2 (two) times daily.     aspirin 81 MG tablet  Take 81 mg by mouth daily.     atorvastatin 40 MG tablet  Commonly known as:  LIPITOR  Take 40 mg by mouth daily.     calcium-vitamin D 500-200 MG-UNIT tablet  Take 1 tablet by mouth 2 (two) times daily.     carboxymethylcellulose 0.5 % Soln  Commonly known as:  REFRESH PLUS  Place 1 drop into both eyes 3 (three) times daily as needed (dry eyes).     chlorthalidone 25 MG  tablet  Commonly known as:  HYGROTON  Take 25 mg by mouth daily.     HYDROcodone-acetaminophen 5-325 MG tablet  Commonly known as:  NORCO  Take 1 tablet by mouth every 4 (four) hours as needed for moderate pain.     latanoprost 0.005 % ophthalmic solution  Commonly known as:  XALATAN  Place 1 drop into both eyes at bedtime.     LEVEMIR FLEXPEN 100 UNIT/ML Pen  Generic drug:  Insulin Detemir  Inject 10-20 Units into the skin daily at 10 pm. If blood sugar > 100 take 20 units, but takes less if lower than 100     MEGARED OMEGA-3 KRILL OIL PO  Take 1 capsule by mouth daily.     metFORMIN 850 MG tablet  Commonly known as:  GLUCOPHAGE  Take 850 mg by mouth 2 (two) times daily with a meal.     methimazole 10 MG tablet  Commonly known as:  TAPAZOLE  Take 5 mg by mouth 2 (two) times a week. Take on Tuesday and Saturday     multivitamin capsule  Take 1 capsule by mouth daily.     NASACORT ALLERGY 24HR 55 MCG/ACT Aero nasal  inhaler  Generic drug:  triamcinolone  Place 2 sprays into the nose daily as needed (sinuses).     NOVOLOG FLEXPEN 100 UNIT/ML injection  Generic drug:  insulin aspart  Inject 0-18 Units into the skin 3 (three) times daily with meals. Based on sliding scale     olmesartan 40 MG tablet  Commonly known as:  BENICAR  Take 40 mg by mouth daily.     ONE TOUCH ULTRA TEST test strip  Generic drug:  glucose blood     spironolactone 25 MG tablet  Commonly known as:  ALDACTONE  Take 6.25 mg by mouth daily.     timolol 0.5 % ophthalmic solution  Commonly known as:  TIMOPTIC  Place 1 drop into both eyes daily.           Follow-up Information    Follow up with Rhyan Radler B, MD In 5 weeks.   Specialty:  General Surgery   Why:  For first fill in new port   Contact information:   [address] [place] [id number] [phone]       Signed: Pedro Earls 02/28/2015, 8:52 AM

## 2015-04-29 NOTE — Progress Notes (Signed)
Cardiology Office Note   Date:  04/30/2015   ID:  Dailey, Eade 12/30/1950, MRN [id number]  PCP:  Gerrit Heck, MD    Chief Complaint  Patient presents with  . Sleep Apnea  . Hypertension      History of Present Illness: Kathleen [name] is a 65 y.o. female who presents for evaluation of OSA.  She has a history of OSA in the past and has been on CPAP for years.  She has not been seen by a sleep specialist in at least 4 years.  She is tolerating her CPAP but thinks it is not working correctly all the time.  The water resevoir has been replaced but does not seem to be working correctly and uses up water early in the night.  She sleeps well at night.   She still feels rested when she gets up in the am and has no daytime sleepiness.  She tolerates her nasal pillow mask and feels the pressure is adequate.  She wakes up in the am with a dry mouth sometimes.  She has had some problems with sinus congestion.    Past Medical History  Diagnosis Date  . Diabetes mellitus   . Hypertension   . Hyperlipidemia   . Thyroid disease     pt currently on no medications   . Glaucoma   . Heart disease   . Menopause   . Mixed hyperlipidemia 09/22/2013  . Morbid obesity ([place])   . Pulmonary hypertension ([place])   . Right-sided heart failure ([place])   . Multinodular goiter     AND HYPERTHYROIDISM   . Proliferative retinopathy     moderate, Dr. Posey Pronto  . Trigger finger     Dr. Rip Harbour  . Proteinuria     Dr. Hassell Done  . Carotid artery plaque     mild to moderate less tan 50% stenosis on u/s 2/14  . Complication of anesthesia     pt states stopped breathing when having tonsillectomy at age 78; pt states has had anesthesia since then without difficulty   . OSA on CPAP     pt states does not know setting   . OSA (obstructive sleep apnea) 04/30/2015  . Obesity (BMI 30-39.9) 04/30/2015    Past Surgical History  Procedure Laterality Date  . Tonsillectomy  1958  .  Laparoscopic gastric banding  2010  . Nerve surgery  2001  . Gastric banding port revision N/A 02/27/2015    Procedure: GASTRIC BANDING PORT REVISION;  Surgeon: Johnathan Hausen, MD;  Location: [facility];  Service: General;  Laterality: N/A;     Current Outpatient Prescriptions  Medication Sig Dispense Refill  . ALPHAGAN P 0.15 % ophthalmic solution Place 1 drop into both eyes 2 (two) times daily.     [place] Kitchen amLODipine (NORVASC) 10 MG tablet Take 10 mg by mouth daily.    Mar[place] Kitchen ascorbic acid (VITAMIN C) 1000 MG tablet Take 1,000 mg by mouth 2 (two) times daily.    Marland Kitchen aspirin 81 MG tablet Take 81 mg by mouth daily.      Marland Kitchen atorvastatin (LIPITOR) 40 MG tablet Take 40 mg by mouth daily.      . Calcium Carbonate-Vitamin D (CALCIUM-VITAMIN D) 500-200 MG-UNIT tablet Take 1 tablet by mouth 2 (two) times daily.    . carboxymethylcellulose (REFRESH PLUS) 0.5 % SOLN Place 1 drop into both eyes 3 (three) times daily  as needed (dry eyes).    . chlorthalidone (HYGROTON) 25 MG tablet Take 25 mg by mouth daily.    Marland Kitchen HYDROcodone-acetaminophen (NORCO) 5-325 MG tablet Take 1 tablet by mouth every 4 (four) hours as needed for moderate pain. 30 tablet 0  . latanoprost (XALATAN) 0.005 % ophthalmic solution Place 1 drop into both eyes at bedtime.     Marland Kitchen LEVEMIR FLEXPEN 100 UNIT/ML SOPN Inject 10-20 Units into the skin daily at 10 pm. If blood sugar > 100 take 20 units, but takes less if lower than 100    . MEGARED OMEGA-3 KRILL OIL PO Take 1 capsule by mouth daily.    . metFORMIN (GLUCOPHAGE) 850 MG tablet Take 850 mg by mouth 2 (two) times daily with a meal.      . methimazole (TAPAZOLE) 10 MG tablet Take 5 mg by mouth daily. Take on Tuesday and Saturday    . Multiple Vitamin (MULTIVITAMIN) capsule Take 1 capsule by mouth daily.      Marland Kitchen NOVOLOG FLEXPEN 100 UNIT/ML injection Inject 0-18 Units into the skin 3 (three) times daily with meals. Based on sliding scale    . olmesartan (BENICAR) 40 MG tablet Take 40 mg by mouth daily.      . ONE TOUCH ULTRA TEST test strip     . spironolactone (ALDACTONE) 25 MG tablet Take 6.25 mg by mouth daily.    . timolol (TIMOPTIC) 0.5 % ophthalmic solution Place 1 drop into both eyes daily.     Marland Kitchen triamcinolone (NASACORT ALLERGY 24HR) 55 MCG/ACT AERO nasal inhaler Place 2 sprays into the nose daily as needed (sinuses).     No current facility-administered medications for this visit.    Allergies:   Epinephrine hcl and Levaquin    Social History:  The patient  reports that she quit smoking about 8 years ago. Her smoking use included Cigarettes. She has a 30 pack-year smoking history. She has never used smokeless tobacco. She reports that she drinks alcohol. She reports that she does not use illicit drugs.   Family History:  The patient's family history includes Heart attack in her father.    ROS:  Please see the history of present illness.   Otherwise, review of systems are positive for none.   All other systems are reviewed and negative.    PHYSICAL EXAM: VS:  BP 142/60 mmHg  Pulse 67  Ht 5\' 3"  (1.6 m)  Wt 220 lb 3.2 oz (99.882 kg)  BMI 39.02 kg/m2  SpO2 93% , BMI Body mass index is 39.02 kg/(m^2). GEN: Well nourished, well developed, in no acute distress HEENT: normal Neck: no JVD, carotid bruits, or masses Cardiac: RRR; no murmurs, rubs, or gallops,no edema  Respiratory:  clear to auscultation bilaterally, normal work of breathing GI: soft, nontender, nondistended, + BS MS: no deformity or atrophy Skin: warm and dry, no rash Neuro:  Strength and sensation are intact Psych: euthymic mood, full affect   EKG:  EKG is not ordered today.    Recent Labs: 02/20/2015: ALT 18; Hemoglobin 11.6*; Platelets 319 02/27/2015: BUN 39*; Creatinine, Ser 1.37*; Magnesium 2.0; Potassium 3.9; Sodium 143    Lipid Panel    Component Value Date/Time   CHOL  02/11/2007 0745    138        ATP III CLASSIFICATION:  <200     mg/dL   Desirable  200-239  mg/dL   Borderline High  >=240     mg/dL   High   TRIG  60 02/11/2007 0745   HDL 44 02/11/2007 0745   CHOLHDL 3.1 02/11/2007 0745   VLDL 12 02/11/2007 0745   LDLCALC  02/11/2007 0745    82        Total Cholesterol/HDL:CHD Risk Coronary Heart Disease Risk Table                     Men   Women  1/2 Average Risk   3.4   3.3      Wt Readings from Last 3 Encounters:  04/30/15 220 lb 3.2 oz (99.882 kg)  02/27/15 233 lb 8 oz (105.915 kg)  02/20/15 233 lb 2 oz (105.745 kg)       ASSESSMENT AND PLAN:  1.  OSA on CPAP and tolerating well but her machine is old and needs a new one.  I have encouraged her to use her nasal saline spray twice daily.  I will order her a new CPAP machine.  I did inform her that she may have to have a new PSG done.  She does not know what pressure she is on but uses [facility] for her equipment.   2.  HTN - controlled on diuretic, amlodipine and ARB 3.  Obesity - I encouraged her to get into a routine exercise program and consider joining weigh watchers.   Current medicines are reviewed at length with the patient today.  The patient does not have concerns regarding medicines.  The following changes have been made:  no change  Labs/ tests ordered today: See above Assessment and Plan No orders of the defined types were placed in this encounter.     Disposition:   FU with me in 1 year  Signed, Sueanne Margarita, MD  04/30/2015 9:29 AM    [facility] [place], [place], [place]  [address] Phone: [phone]; Fax: [phone]

## 2015-04-30 ENCOUNTER — Ambulatory Visit (INDEPENDENT_AMBULATORY_CARE_PROVIDER_SITE_OTHER): Payer: BLUE CROSS/BLUE SHIELD | Admitting: Cardiology

## 2015-04-30 ENCOUNTER — Encounter: Payer: Self-pay | Admitting: Cardiology

## 2015-04-30 VITALS — BP 142/60 | HR 67 | Ht 63.0 in | Wt 220.2 lb

## 2015-04-30 DIAGNOSIS — G4733 Obstructive sleep apnea (adult) (pediatric): Secondary | ICD-10-CM | POA: Insufficient documentation

## 2015-04-30 DIAGNOSIS — I10 Essential (primary) hypertension: Secondary | ICD-10-CM | POA: Diagnosis not present

## 2015-04-30 DIAGNOSIS — E669 Obesity, unspecified: Secondary | ICD-10-CM

## 2015-04-30 HISTORY — DX: Obesity, unspecified: E66.9

## 2015-04-30 NOTE — Patient Instructions (Signed)
Medication Instructions:  Your physician recommends that you continue on your current medications as directed. Please refer to the Current Medication list given to you today.   Labwork: None  Testing/Procedures: None  Follow-Up: Your physician wants you to follow-up in: 1 year with Dr. Radford Pax. You will receive a reminder letter in the mail two months in advance. If you don't receive a letter, please call our office to schedule the follow-up appointment.   Any Other Special Instructions Will Be Listed Below (If Applicable). A new PAP is being ordered for you from [facility].   Our office CPAP assistant is [place], [place]. Her direct line is [phone].    If you need a refill on your cardiac medications before your next appointment, please call your pharmacy.

## 2015-05-07 ENCOUNTER — Telehealth: Payer: Self-pay | Admitting: *Deleted

## 2015-05-07 NOTE — Telephone Encounter (Signed)
At the suggestion of Dr. Radford Pax, we will be scheduling a sleep study to be sure that patient is on the right therapy.  Patient is okay with treatment plan, but has requested to wait a month until she is on her new insurance plan.   Will touch base with her at the end of April (per her request) to schedule sleep study.

## 2015-05-10 ENCOUNTER — Other Ambulatory Visit (INDEPENDENT_AMBULATORY_CARE_PROVIDER_SITE_OTHER): Payer: Self-pay | Admitting: Physician Assistant

## 2015-05-10 ENCOUNTER — Ambulatory Visit
Admission: RE | Admit: 2015-05-10 | Discharge: 2015-05-10 | Disposition: A | Discharge status: Home / Self Care | Payer: BLUE CROSS/BLUE SHIELD | Source: Ambulatory Visit | Attending: Physician Assistant | Admitting: Physician Assistant

## 2015-05-10 DIAGNOSIS — Z9884 Bariatric surgery status: Secondary | ICD-10-CM

## 2015-06-08 ENCOUNTER — Other Ambulatory Visit: Payer: Self-pay | Admitting: *Deleted

## 2015-06-08 DIAGNOSIS — G4733 Obstructive sleep apnea (adult) (pediatric): Secondary | ICD-10-CM

## 2015-07-09 DIAGNOSIS — H3582 Retinal ischemia: Secondary | ICD-10-CM | POA: Diagnosis not present

## 2015-07-09 DIAGNOSIS — H43813 Vitreous degeneration, bilateral: Secondary | ICD-10-CM | POA: Diagnosis not present

## 2015-07-09 DIAGNOSIS — E113311 Type 2 diabetes mellitus with moderate nonproliferative diabetic retinopathy with macular edema, right eye: Secondary | ICD-10-CM | POA: Diagnosis not present

## 2015-07-09 DIAGNOSIS — E113392 Type 2 diabetes mellitus with moderate nonproliferative diabetic retinopathy without macular edema, left eye: Secondary | ICD-10-CM | POA: Diagnosis not present

## 2015-07-19 DIAGNOSIS — Z4651 Encounter for fitting and adjustment of gastric lap band: Secondary | ICD-10-CM | POA: Diagnosis not present

## 2015-08-03 ENCOUNTER — Ambulatory Visit (HOSPITAL_BASED_OUTPATIENT_CLINIC_OR_DEPARTMENT_OTHER): Payer: Medicare Other | Attending: Cardiology | Admitting: Cardiology

## 2015-08-03 VITALS — Ht 63.0 in | Wt 220.0 lb

## 2015-08-03 DIAGNOSIS — G4733 Obstructive sleep apnea (adult) (pediatric): Secondary | ICD-10-CM | POA: Diagnosis not present

## 2015-08-03 DIAGNOSIS — I493 Ventricular premature depolarization: Secondary | ICD-10-CM | POA: Insufficient documentation

## 2015-08-03 DIAGNOSIS — Z9989 Dependence on other enabling machines and devices: Secondary | ICD-10-CM

## 2015-08-03 DIAGNOSIS — R0683 Snoring: Secondary | ICD-10-CM | POA: Diagnosis not present

## 2015-08-03 HISTORY — PX: SPLIT NIGHT STUDY: SLE1000

## 2015-08-05 ENCOUNTER — Encounter (HOSPITAL_BASED_OUTPATIENT_CLINIC_OR_DEPARTMENT_OTHER): Payer: Self-pay | Admitting: Cardiology

## 2015-08-05 ENCOUNTER — Telehealth: Payer: Self-pay | Admitting: Cardiology

## 2015-08-05 NOTE — Telephone Encounter (Signed)
Please let patient know that they have significant sleep apnea and had successful CPAP titration and will be set up with CPAP unit.  Please let DME know that order is in EPIC.  Please set patient up for OV in 10 weeks 

## 2015-08-05 NOTE — Procedures (Signed)
Patient Name: Kathleen [name], Kathleen [name] MRN: [id number] Study Date: 08/03/2015 Gender: Female D.O.B: 10-09-1950 Age (years): 56 Referring Provider: Fransico Him MD, ABSM Interpreting Physician: Fransico Him MD, ABSM RPSGT: Jonna Coup  Weight (lbs): 220 BMI: 39 Height (inches): 63 Neck Size: 16.00  CLINICAL INFORMATION Sleep Study Type: Split Night CPAP Indication for sleep study: OSA Epworth Sleepiness Score: 4  SLEEP STUDY TECHNIQUE As per the AASM Manual for the Scoring of Sleep and Associated Events v2.3 (April 2016) with a hypopnea requiring 4% desaturations. The channels recorded and monitored were frontal, central and occipital EEG, electrooculogram (EOG), submentalis EMG (chin), nasal and oral airflow, thoracic and abdominal wall motion, anterior tibialis EMG, snore microphone, electrocardiogram, and pulse oximetry. Continuous positive airway pressure (CPAP) was initiated when the patient met split night criteria and was titrated according to treat sleep-disordered breathing.  MEDICATIONS Medications taken by the patient : Reviewed in the chart Medications administered by patient during sleep study : No sleep medicine administered.  RESPIRATORY PARAMETERS Diagnostic Total AHI (/hr): 25.8  RDI (/hr):29.5   OA Index (/hr): 1.8  CA Index (/hr): 0.0 REM AHI (/hr): 28.8  NREM AHI (/hr):25.1  Supine AHI (/hr):62.4  Non-supine AHI (/hr):16.08 Min O2 Sat (%):81.00  Mean O2 (%):88.20 Time below 88% (min):66.0   Titration Optimal Pressure (cm):9  AHI at Optimal Pressure (/hr):0.0  Min O2 at Optimal Pressure (%):92.0 Supine % at Optimal (%):0  Sleep % at Optimal (%):82    SLEEP ARCHITECTURE The recording time for the entire night was 358.8 minutes. During a baseline period of 175.2 minutes, the patient slept for 132.4 minutes in REM and nonREM, yielding a reduced sleep efficiency of 75.6%. Sleep onset after lights out was 21.8 minutes with a REM latency of 84.0 minutes. The  patient spent 10.20% of the night in stage N1 sleep, 70.92% in stage N2 sleep, 0.00% in stage N3 and 18.88% in REM.   During the titration period of 182.1 minutes, the patient slept for 154.0 minutes in REM and nonREM, yielding a reduced sleep efficiency of 84.6%. Sleep onset after CPAP initiation was 18.6 minutes with a REM latency of 55.5 minutes. The patient spent 7.47% of the night in stage N1 sleep, 69.47% in stage N2 sleep, 0.00% in stage N3 and 23.06% in REM.  CARDIAC DATA The 2 lead EKG demonstrated sinus rhythm. The mean heart rate was 58.56 beats per minute. Other EKG findings include: Frequent PVC's, ventricular couplets, bigeminal PVCs..  LEG MOVEMENT DATA The total Periodic Limb Movements of Sleep (PLMS) were 42. The PLMS index was 8.80 .  IMPRESSIONS - Moderate obstructive sleep apnea occurred during the diagnostic portion of the study(AHI = 25.8/hour). An optimal PAP pressure was selected for this patient ( 9 cm of water) - No significant central sleep apnea occurred during the diagnostic portion of the study (CAI = 0.0/hour). - Severe oxygen desaturation was noted during the diagnostic portion of the study (Min O2 = 81.00%). - The patient snored with Moderate snoring volume during the diagnostic portion of the study. - Frequent PVCs, ventricular couplets and bigeminal PVCs were noted during this study. - Mild periodic limb movements of sleep occurred during the study.  DIAGNOSIS - Obstructive Sleep Apnea (327.23 [G47.33 ICD-10]) - PVCs  RECOMMENDATIONS - Trial of CPAP therapy on 9 cm H2O with a Standard size Fisher&Paykel Nasal Pillow Mask [place] Q mask and heated humidification. - Avoid alcohol, sedatives and other CNS depressants that may worsen sleep apnea and disrupt normal sleep architecture. -  Sleep hygiene should be reviewed to assess factors that may improve sleep quality. - Weight management and regular exercise should be initiated or continued. - Return to Sleep  Center for re-evaluation after 10 weeks of therapy    [place], [facility]  ELECTRONICALLY SIGNED ON:  08/05/2015, 8:50 PM [place] PH: [phone]   FX: [phone] [place]

## 2015-08-10 NOTE — Telephone Encounter (Signed)
Patient informed of information.  Stated verbal understanding.  Patient  Has been with [facility] for a while, and requested that orders be sent to them.  I have let them know that the orders are in.    Patient will call when she gets her new machine to schedule 10 week follow-up.

## 2015-08-23 DIAGNOSIS — Z4651 Encounter for fitting and adjustment of gastric lap band: Secondary | ICD-10-CM | POA: Diagnosis not present

## 2015-09-27 DIAGNOSIS — I10 Essential (primary) hypertension: Secondary | ICD-10-CM | POA: Diagnosis not present

## 2015-09-27 DIAGNOSIS — E785 Hyperlipidemia, unspecified: Secondary | ICD-10-CM | POA: Diagnosis not present

## 2015-09-27 DIAGNOSIS — E119 Type 2 diabetes mellitus without complications: Secondary | ICD-10-CM | POA: Diagnosis not present

## 2015-09-27 DIAGNOSIS — Z7984 Long term (current) use of oral hypoglycemic drugs: Secondary | ICD-10-CM | POA: Diagnosis not present

## 2015-09-27 DIAGNOSIS — E0590 Thyrotoxicosis, unspecified without thyrotoxic crisis or storm: Secondary | ICD-10-CM | POA: Diagnosis not present

## 2015-09-27 DIAGNOSIS — R809 Proteinuria, unspecified: Secondary | ICD-10-CM | POA: Diagnosis not present

## 2015-09-27 DIAGNOSIS — Z794 Long term (current) use of insulin: Secondary | ICD-10-CM | POA: Diagnosis not present

## 2015-10-02 DIAGNOSIS — I10 Essential (primary) hypertension: Secondary | ICD-10-CM | POA: Diagnosis not present

## 2015-10-02 DIAGNOSIS — E0590 Thyrotoxicosis, unspecified without thyrotoxic crisis or storm: Secondary | ICD-10-CM | POA: Diagnosis not present

## 2015-10-02 DIAGNOSIS — E1129 Type 2 diabetes mellitus with other diabetic kidney complication: Secondary | ICD-10-CM | POA: Diagnosis not present

## 2015-10-02 DIAGNOSIS — Z794 Long term (current) use of insulin: Secondary | ICD-10-CM | POA: Diagnosis not present

## 2015-10-02 DIAGNOSIS — E785 Hyperlipidemia, unspecified: Secondary | ICD-10-CM | POA: Diagnosis not present

## 2015-10-02 DIAGNOSIS — E119 Type 2 diabetes mellitus without complications: Secondary | ICD-10-CM | POA: Diagnosis not present

## 2015-10-02 DIAGNOSIS — Z7984 Long term (current) use of oral hypoglycemic drugs: Secondary | ICD-10-CM | POA: Diagnosis not present

## 2015-10-03 DIAGNOSIS — E119 Type 2 diabetes mellitus without complications: Secondary | ICD-10-CM | POA: Diagnosis not present

## 2015-10-03 DIAGNOSIS — E0520 Thyrotoxicosis with toxic multinodular goiter without thyrotoxic crisis or storm: Secondary | ICD-10-CM | POA: Diagnosis not present

## 2015-10-09 DIAGNOSIS — H3582 Retinal ischemia: Secondary | ICD-10-CM | POA: Diagnosis not present

## 2015-10-09 DIAGNOSIS — H43813 Vitreous degeneration, bilateral: Secondary | ICD-10-CM | POA: Diagnosis not present

## 2015-10-09 DIAGNOSIS — E113392 Type 2 diabetes mellitus with moderate nonproliferative diabetic retinopathy without macular edema, left eye: Secondary | ICD-10-CM | POA: Diagnosis not present

## 2015-10-09 DIAGNOSIS — E113311 Type 2 diabetes mellitus with moderate nonproliferative diabetic retinopathy with macular edema, right eye: Secondary | ICD-10-CM | POA: Diagnosis not present

## 2015-10-12 ENCOUNTER — Encounter (HOSPITAL_BASED_OUTPATIENT_CLINIC_OR_DEPARTMENT_OTHER): Payer: Self-pay | Admitting: *Deleted

## 2015-10-12 ENCOUNTER — Emergency Department (HOSPITAL_BASED_OUTPATIENT_CLINIC_OR_DEPARTMENT_OTHER)
Admission: EM | Admit: 2015-10-12 | Discharge: 2015-10-12 | Disposition: A | Discharge status: Home / Self Care | Payer: Medicare Other | Attending: Emergency Medicine | Admitting: Emergency Medicine

## 2015-10-12 DIAGNOSIS — I10 Essential (primary) hypertension: Secondary | ICD-10-CM | POA: Insufficient documentation

## 2015-10-12 DIAGNOSIS — E119 Type 2 diabetes mellitus without complications: Secondary | ICD-10-CM | POA: Diagnosis not present

## 2015-10-12 DIAGNOSIS — Z87891 Personal history of nicotine dependence: Secondary | ICD-10-CM | POA: Insufficient documentation

## 2015-10-12 DIAGNOSIS — R220 Localized swelling, mass and lump, head: Secondary | ICD-10-CM | POA: Diagnosis not present

## 2015-10-12 LAB — BASIC METABOLIC PANEL
ANION GAP: 9 (ref 5–15)
BUN: 31 mg/dL — ABNORMAL HIGH (ref 6–20)
CALCIUM: 9 mg/dL (ref 8.9–10.3)
CO2: 31 mmol/L (ref 22–32)
Chloride: 98 mmol/L — ABNORMAL LOW (ref 101–111)
Creatinine, Ser: 1.4 mg/dL — ABNORMAL HIGH (ref 0.44–1.00)
GFR calc non Af Amer: 38 mL/min — ABNORMAL LOW (ref >60)
GFR, EST AFRICAN AMERICAN: 45 mL/min — AB (ref >60)
GLUCOSE: 152 mg/dL — AB (ref 65–99)
POTASSIUM: 3.7 mmol/L (ref 3.5–5.1)
Sodium: 138 mmol/L (ref 135–145)

## 2015-10-12 MED ORDER — PREDNISONE 10 MG PO TABS: 20.0000 mg | ORAL_TABLET | Freq: Every day | ORAL | 0 refills | Status: DC

## 2015-10-12 MED ORDER — FAMOTIDINE 20 MG PO TABS
40.0000 mg | ORAL_TABLET | Freq: Once | ORAL | Status: AC
  Administered 2015-10-12: 40 mg via ORAL
  Filled 2015-10-12: qty 2

## 2015-10-12 MED ORDER — PREDNISONE 50 MG PO TABS
60.0000 mg | ORAL_TABLET | Freq: Once | ORAL | Status: AC
  Administered 2015-10-12: 60 mg via ORAL
  Filled 2015-10-12: qty 1

## 2015-10-12 MED ORDER — DIPHENHYDRAMINE HCL 25 MG PO CAPS
50.0000 mg | ORAL_CAPSULE | Freq: Once | ORAL | Status: AC
  Administered 2015-10-12: 50 mg via ORAL
  Filled 2015-10-12: qty 2

## 2015-10-12 NOTE — Discharge Instructions (Signed)
You have been seen in the Emergency Department (ED) today for an allergic reaction.  You have been stable throughout your stay in the Emergency Department.  Please take your medications as prescribed and follow up with your doctor as indicated.  You should also take over-the-counter Benadryl around the clock for the next three days according to the dosing instructions on the package.  Stop taking the Benicar as discussed. Talk with you PCP regarding alternative.   Return to the Emergency Department (ED) if you experience any worsening or new symptoms that concern you.

## 2015-10-12 NOTE — ED Provider Notes (Signed)
Emergency Department Provider Note   I have reviewed the triage vital signs and the nursing notes.   HISTORY  Chief Complaint Oral Swelling   HPI Kathleen [name] is a 65 y.o. female with PMH of CAD, DM, HTN, HLD, and OSA presents to to the emergency department for evaluation of isolated lower lip swelling and tingling sensation in the tongue. Patient states that she awoke with the above symptoms. No obvious trigger. She reports a remote allergic reaction to lidocaine with epinephrine when she was a child. Patient states that she felt somewhat dehydrated yesterday but overall has been well. The patient does take Olmesartan for the past year and has been compliant with this medicine.   Within she awoke this morning the patient noticed some mild lower lip swelling and tingling in the tongue. She denies any difficulty swallowing or breathing. She feels like the swelling has not gotten worse and she first noticed it. She denies any associated pain or unilateral symptoms. No chest pain or difficulty breathing. Abdominal cramping, nausea, vomiting, diarrhea. She did not take medication for swelling prior to ED presentation.   Past Medical History:  Diagnosis Date  . Carotid artery plaque    mild to moderate less tan 50% stenosis on u/s 2/14  . Complication of anesthesia    pt states stopped breathing when having tonsillectomy at age 89; pt states has had anesthesia since then without difficulty   . Diabetes mellitus   . Glaucoma   . Heart disease   . Hyperlipidemia   . Hypertension   . Menopause   . Mixed hyperlipidemia 09/22/2013  . Morbid obesity ([place])   . Multinodular goiter    AND HYPERTHYROIDISM   . Obesity (BMI 30-39.9) 04/30/2015  . OSA (obstructive sleep apnea) 04/30/2015  . OSA on CPAP    pt states does not know setting   . Proliferative retinopathy    moderate, Dr. Posey Pronto  . Proteinuria    Dr. Hassell Done  . Pulmonary hypertension ([place])   . Right-sided heart failure ([place])   .  Thyroid disease    pt currently on no medications   . Trigger finger    Dr. Rip Harbour    Patient Active Problem List   Diagnosis Date Noted  . OSA (obstructive sleep apnea) 04/30/2015  . Obesity (BMI 30-39.9) 04/30/2015  . Status post gastric banding 02/27/2015  . Varicose veins of leg with complications [id number]  . Metatarsal deformity 10/04/2013  . Nonspecific abnormal unspecified cardiovascular function study 09/29/2013  . Mixed hyperlipidemia 09/22/2013  . Tenosynovitis of foot and ankle 09/14/2013  . Pronation deformity of ankle, acquired 09/14/2013  . Deformity of metatarsal bone of right foot 09/14/2013  . Deformity of metatarsal bone of left foot 09/14/2013  . Proteinuria 07/28/2012  . DYSPNEA 01/11/2008  . HYPERTHYROIDISM 05/10/2007  . UNSPECIFIED DISORDER OF THYROID 05/06/2007  . DIABETES, TYPE 2 03/22/2007  . HYPERLIPIDEMIA 03/22/2007  . Essential hypertension 03/22/2007  . C O P D 03/22/2007  . OBSTRUCTIVE SLEEP APNEA 03/22/2007  . MEDIASTINAL ADENOPATHY CA[place]LEMANS D 03/22/2007  . OXYGEN-USE OF SUPPLEMENTAL 03/22/2007    Past Surgical History:  Procedure Laterality Date  . GASTRIC BANDING PORT REVISION N/A 02/27/2015   Procedure: GASTRIC BANDING PORT REVISION;  Surgeon: Johnathan Hausen, MD;  Location: [facility];  Service: General;  Laterality: N/A;  . LAPAROSCOPIC GASTRIC BANDING  2010  . NERVE SURGERY  2001  . SPLIT NIGHT STUDY  08/03/2015  . TONSILLECTOMY  1958    Current  Outpatient Rx  . Order #: [id number] Class: Historical Med  . Order #: [id number] Class: Historical Med  . Order #: [id number] Class: Historical Med  . Order #: [id number] Class: Historical Med  . Order #: [id number] Class: Historical Med  . Order #: [id number] Class: Historical Med  . Order #: [id number] Class: Historical Med  . Order #: [id number] Class: Historical Med  . Order #: [id number] Class: Historical Med  . Order #: [id number] Class: Historical Med  . Order #: [id number] Class: Historical Med  . Order #:  [id number] Class: Historical Med  . Order #: [id number] Class: Historical Med  . Order #: [id number] Class: Historical Med  . Order #: [id number] Class: Historical Med  . Order #: [id number] Class: Historical Med  . Order #: [id number] Class: Historical Med  . Order #: [id number] Class: Historical Med  . Order #: [id number] Class: Print    Allergies Benicar [olmesartan]; Epinephrine hcl; and Levaquin [levofloxacin in d5w]  Family History  Problem Relation Age of Onset  . Heart attack Father     Social History Social History  Substance Use Topics  . Smoking status: Former Smoker    Packs/day: 1.00    Years: 30.00    Types: Cigarettes    Quit date: 03/04/2007  . Smokeless tobacco: Never Used  . Alcohol use 0.0 oz/week     Comment: occas    Review of Systems  Constitutional: No fever/chills Eyes: No visual changes. ENT: No sore throat. Lower lip swelling and tingling in the tongue.  Cardiovascular: Denies chest pain. Respiratory: Denies shortness of breath. Gastrointestinal: No abdominal pain.  No nausea, no vomiting.  No diarrhea.  No constipation. Genitourinary: Negative for dysuria. Musculoskeletal: Negative for back pain. Skin: Negative for rash. Neurological: Negative for headaches, focal weakness or numbness.  10-point ROS otherwise negative.  ____________________________________________   PHYSICAL EXAM:  VITAL SIGNS: Temp: 98.62F Resp: 16 SpO2: 98% Pulse: 57 BP: 119/84  Constitutional: Alert and oriented. Well appearing and in no acute distress. Laying back and speaking in normal tone of voice.  Eyes: Conjunctivae are normal. PERRL. EOMI. Head: Atraumatic. Nose: No congestion/rhinnorhea. Mouth/Throat: Mucous membranes are moist.  Oropharynx non-erythematous. No tongue swelling. Posterior pharynx is visible with posterior structures without swelling. No respiratory distress. Managing oral secretions. Very mild diffuse swelling to the lower lip.  Neck: No stridor.     Cardiovascular: Normal rate, regular rhythm. Good peripheral circulation. Grossly normal heart sounds.   Respiratory: Normal respiratory effort.  No retractions. Lungs CTAB. Gastrointestinal: Soft and nontender. No distention.  Musculoskeletal: No lower extremity tenderness nor edema. No gross deformities of extremities. Neurologic:  Normal speech and language. No gross focal neurologic deficits are appreciated.  Skin:  Skin is warm, dry and intact. No rash noted. Psychiatric: Mood and affect are normal. Speech and behavior are normal.  ____________________________________________   LABS (all labs ordered are listed, but only abnormal results are displayed)  Labs Reviewed  BASIC METABOLIC PANEL - Abnormal; Notable for the following:       Result Value   Chloride 98 (*)    Glucose, Bld 152 (*)    BUN 31 (*)    Creatinine, Ser 1.40 (*)    GFR calc non Af Amer 38 (*)    GFR calc Af Amer 45 (*)    All other components within normal limits   ____________________________________________  RADIOLOGY  None ____________________________________________   PROCEDURES  Procedure(s) performed:   Procedures  None ____________________________________________   INITIAL IMPRESSION / ASSESSMENT AND PLAN / ED COURSE  Pertinent labs & imaging results that were available during my  care of the patient were reviewed by me and considered in my medical decision making (see chart for details).  Patient resents to the emergency department for evaluation of isolated lower lip swelling and tingling in the tongue since waking this morning. Patient is on a Olmesartan with no history of anaphylaxis. No associated hives or GI symptoms to suggest acute allergic reaction. The patient is lying back comfortably with extremely mild swelling of the lower lip. No appreciable swelling of the tongue. Airway is widely patent with normal voice. Patient believes she has an allergy to epinephrine after exposure as  a child 2 lidocaine mixed with epinephrine. No indication for epinephrine at this time. I discussed that my impression is this may be secondary to angioedema from her medication. The swelling is very mild and she is in no acute distress. There is no indication for airway intervention at this time. Patient does not feel that these symptoms are getting worse since onset. We'll give Benadryl, steroids, Pepcid and placed the patient on monitor. Plan to observe in the emergency department. Discussed with the patient that if symptoms get worse she will require admission and possibly advanced airway management. Also discussed at this time her symptoms are very mild.  Advised the patient to immediately stop taking Olmesartan. Will place as allergy in Epic. She will discuss this reaction with PCP.   11:05 AM Patient feeling the same. No significant improvement with medication. Walking around the room in no distress. Objectively no worsening swelling of the lower lip. No tongue swelling. Anticipate additional ED observation time and reassessment to decide on ultimate disposition.   01:04 PM Patient is feeling better with no tongue symptoms. No worsening of lower lip swelling with slight improvement. No evidence of progressing angioedema. Discussed stopping the Olmesartan immediately. Gave very strict return precautions to patient and friend at bedside. ____________________________________________  FINAL CLINICAL IMPRESSION(S) / ED DIAGNOSES  Final diagnoses:  Lip swelling     MEDICATIONS GIVEN DURING THIS VISIT:  Medications  diphenhydrAMINE (BENADRYL) capsule 50 mg (50 mg Oral Given 10/12/15 1005)  predniSONE (DELTASONE) tablet 60 mg (60 mg Oral Given 10/12/15 1005)  famotidine (PEPCID) tablet 40 mg (40 mg Oral Given 10/12/15 1005)     NEW OUTPATIENT MEDICATIONS STARTED DURING THIS VISIT:  New Prescriptions   PREDNISONE (DELTASONE) 10 MG TABLET    Take 2 tablets (20 mg total) by mouth daily.       Note:  This document was prepared using Dragon voice recognition software and may include unintentional dictation errors.  Nanda Quinton, MD Emergency Medicine   Margette Fast, MD 10/12/15 1310

## 2015-10-12 NOTE — ED Triage Notes (Addendum)
C/o awoke this am and feels like bottom lip is swollen and feels like tongue is numb. Pt states she drank water this morning and it drooled out of her mouth before she could swallow it. Pt states she had no trouble swallowing.

## 2015-10-15 DIAGNOSIS — T783XXD Angioneurotic edema, subsequent encounter: Secondary | ICD-10-CM | POA: Diagnosis not present

## 2015-10-25 ENCOUNTER — Other Ambulatory Visit: Payer: Self-pay | Admitting: Nurse Practitioner

## 2015-10-25 ENCOUNTER — Other Ambulatory Visit (HOSPITAL_COMMUNITY)
Admission: RE | Admit: 2015-10-25 | Discharge: 2015-10-25 | Disposition: A | Discharge status: Home / Self Care | Payer: Medicare Other | Source: Ambulatory Visit | Attending: Nurse Practitioner | Admitting: Nurse Practitioner

## 2015-10-25 DIAGNOSIS — Z01419 Encounter for gynecological examination (general) (routine) without abnormal findings: Secondary | ICD-10-CM | POA: Diagnosis not present

## 2015-10-25 DIAGNOSIS — Z1151 Encounter for screening for human papillomavirus (HPV): Secondary | ICD-10-CM | POA: Diagnosis not present

## 2015-10-26 LAB — CYTOLOGY - PAP

## 2015-11-06 DIAGNOSIS — I10 Essential (primary) hypertension: Secondary | ICD-10-CM | POA: Diagnosis not present

## 2015-11-06 DIAGNOSIS — Z23 Encounter for immunization: Secondary | ICD-10-CM | POA: Diagnosis not present

## 2015-11-06 DIAGNOSIS — E785 Hyperlipidemia, unspecified: Secondary | ICD-10-CM | POA: Diagnosis not present

## 2015-11-06 DIAGNOSIS — M8588 Other specified disorders of bone density and structure, other site: Secondary | ICD-10-CM | POA: Diagnosis not present

## 2015-11-06 DIAGNOSIS — Z0000 Encounter for general adult medical examination without abnormal findings: Secondary | ICD-10-CM | POA: Diagnosis not present

## 2015-11-06 DIAGNOSIS — G4733 Obstructive sleep apnea (adult) (pediatric): Secondary | ICD-10-CM | POA: Diagnosis not present

## 2015-11-06 DIAGNOSIS — E119 Type 2 diabetes mellitus without complications: Secondary | ICD-10-CM | POA: Diagnosis not present

## 2015-11-06 DIAGNOSIS — E1339 Other specified diabetes mellitus with other diabetic ophthalmic complication: Secondary | ICD-10-CM | POA: Diagnosis not present

## 2015-11-06 DIAGNOSIS — R809 Proteinuria, unspecified: Secondary | ICD-10-CM | POA: Diagnosis not present

## 2015-11-06 DIAGNOSIS — E11319 Type 2 diabetes mellitus with unspecified diabetic retinopathy without macular edema: Secondary | ICD-10-CM | POA: Diagnosis not present

## 2015-11-06 DIAGNOSIS — Z1159 Encounter for screening for other viral diseases: Secondary | ICD-10-CM | POA: Diagnosis not present

## 2015-11-06 DIAGNOSIS — I509 Heart failure, unspecified: Secondary | ICD-10-CM | POA: Diagnosis not present

## 2015-11-14 DIAGNOSIS — H04123 Dry eye syndrome of bilateral lacrimal glands: Secondary | ICD-10-CM | POA: Diagnosis not present

## 2015-11-14 DIAGNOSIS — H401131 Primary open-angle glaucoma, bilateral, mild stage: Secondary | ICD-10-CM | POA: Diagnosis not present

## 2015-11-20 DIAGNOSIS — E113311 Type 2 diabetes mellitus with moderate nonproliferative diabetic retinopathy with macular edema, right eye: Secondary | ICD-10-CM | POA: Diagnosis not present

## 2015-11-22 DIAGNOSIS — Z9884 Bariatric surgery status: Secondary | ICD-10-CM | POA: Diagnosis not present

## 2015-11-27 DIAGNOSIS — I10 Essential (primary) hypertension: Secondary | ICD-10-CM | POA: Diagnosis not present

## 2015-11-27 DIAGNOSIS — Z6836 Body mass index (BMI) 36.0-36.9, adult: Secondary | ICD-10-CM | POA: Diagnosis not present

## 2015-11-27 DIAGNOSIS — R809 Proteinuria, unspecified: Secondary | ICD-10-CM | POA: Diagnosis not present

## 2015-11-27 DIAGNOSIS — E1121 Type 2 diabetes mellitus with diabetic nephropathy: Secondary | ICD-10-CM | POA: Diagnosis not present

## 2015-11-28 ENCOUNTER — Ambulatory Visit (INDEPENDENT_AMBULATORY_CARE_PROVIDER_SITE_OTHER): Payer: Medicare Other | Admitting: Cardiology

## 2015-11-28 ENCOUNTER — Encounter: Payer: Self-pay | Admitting: Cardiology

## 2015-11-28 VITALS — BP 132/80 | HR 69 | Ht 63.0 in | Wt 208.1 lb

## 2015-11-28 DIAGNOSIS — I493 Ventricular premature depolarization: Secondary | ICD-10-CM | POA: Diagnosis not present

## 2015-11-28 DIAGNOSIS — G4733 Obstructive sleep apnea (adult) (pediatric): Secondary | ICD-10-CM | POA: Diagnosis not present

## 2015-11-28 DIAGNOSIS — I10 Essential (primary) hypertension: Secondary | ICD-10-CM

## 2015-11-28 DIAGNOSIS — E669 Obesity, unspecified: Secondary | ICD-10-CM | POA: Diagnosis not present

## 2015-11-28 NOTE — Patient Instructions (Signed)

## 2015-11-28 NOTE — Progress Notes (Signed)
Cardiology Office Note    Date:  11/29/2015   ID:  Kathleen, [name] 1950/12/12, MRN [id number]  PCP:  Gerrit Heck, MD  Cardiologist:  Fransico Him, MD   Chief Complaint  Patient presents with  . Sleep Apnea    History of Present Illness:  Kathleen [name] is a 65 y.o. female  who presents for followup of OSA.  She is tolerating her CPAP.  She sleeps well at night.   She still feels rested when she gets up in the am and has no daytime sleepiness.  She tolerates her nasal pillow mask and feels the pressure is adequate.  She wakes up in the am with a dry mouth sometimes but feels it has improved.  She has no sinus congestion    Past Medical History:  Diagnosis Date  . Carotid artery plaque    mild to moderate less tan 50% stenosis on u/s 2/14  . Complication of anesthesia    pt states stopped breathing when having tonsillectomy at age 21; pt states has had anesthesia since then without difficulty   . Diabetes mellitus   . Glaucoma   . Heart disease   . Hyperlipidemia   . Hypertension   . Menopause   . Mixed hyperlipidemia 09/22/2013  . Morbid obesity ([place])   . Multinodular goiter    AND HYPERTHYROIDISM   . Obesity (BMI 30-39.9) 04/30/2015  . OSA on CPAP   . Proliferative retinopathy    moderate, Dr. Posey Pronto  . Proteinuria    Dr. Hassell Done  . Pulmonary hypertension ([place])   . Right-sided heart failure ([place])   . Thyroid disease    pt currently on no medications   . Trigger finger    Dr. Rip Harbour    Past Surgical History:  Procedure Laterality Date  . GASTRIC BANDING PORT REVISION N/A 02/27/2015   Procedure: GASTRIC BANDING PORT REVISION;  Surgeon: Johnathan Hausen, MD;  Location: [facility];  Service: General;  Laterality: N/A;  . LAPAROSCOPIC GASTRIC BANDING  2010  . NERVE SURGERY  2001  . SPLIT NIGHT STUDY  08/03/2015  . TONSILLECTOMY  1958    Current Medications: Outpatient Medications Prior to Visit  Medication Sig Dispense Refill  . ALPHAGAN P 0.15 %  ophthalmic solution Place 1 drop into both eyes 2 (two) times daily.     Marland Kitchen amLODipine (NORVASC) 10 MG tablet Take 10 mg by mouth daily.    Marland Kitchen ascorbic acid (VITAMIN C) 1000 MG tablet Take 1,000 mg by mouth 2 (two) times daily.    Marland Kitchen aspirin 81 MG tablet Take 81 mg by mouth daily.      Marland Kitchen atorvastatin (LIPITOR) 40 MG tablet Take 40 mg by mouth daily.      . Calcium Carbonate-Vitamin D (CALCIUM-VITAMIN D) 500-200 MG-UNIT tablet Take 1 tablet by mouth 2 (two) times daily.    . carboxymethylcellulose (REFRESH PLUS) 0.5 % SOLN Place 1 drop into both eyes 3 (three) times daily as needed (dry eyes).    . chlorthalidone (HYGROTON) 25 MG tablet Take 25 mg by mouth daily.    Marland Kitchen latanoprost (XALATAN) 0.005 % ophthalmic solution Place 1 drop into both eyes at bedtime.     Marland Kitchen LEVEMIR FLEXPEN 100 UNIT/ML SOPN Inject 10-20 Units into the skin daily at 10 pm. If blood sugar > 100 take 20 units, but takes less if lower than 100    . MEGARED OMEGA-3 KRILL OIL PO Take 1 capsule by mouth daily.    Marland Kitchen  metFORMIN (GLUCOPHAGE) 850 MG tablet Take 850 mg by mouth 2 (two) times daily with a meal.      . methimazole (TAPAZOLE) 10 MG tablet Take 5 mg by mouth daily. Take on Tuesday and Saturday    . Multiple Vitamin (MULTIVITAMIN) capsule Take 1 capsule by mouth daily.      Marland Kitchen NOVOLOG FLEXPEN 100 UNIT/ML injection Inject 0-18 Units into the skin 3 (three) times daily with meals. Based on sliding scale    . ONE TOUCH ULTRA TEST test strip     . predniSONE (DELTASONE) 10 MG tablet Take 2 tablets (20 mg total) by mouth daily. 15 tablet 0  . spironolactone (ALDACTONE) 25 MG tablet Take 6.25 mg by mouth daily.    . timolol (TIMOPTIC) 0.5 % ophthalmic solution Place 1 drop into both eyes daily.      No facility-administered medications prior to visit.      Allergies:   Ace inhibitors; Benicar [olmesartan]; Epinephrine hcl; and Levaquin [levofloxacin in d5w]   Social History   Social History  . Marital status: Single    Spouse  name: N/A  . Number of children: N/A  . Years of education: N/A   Social History Main Topics  . Smoking status: Former Smoker    Packs/day: 1.00    Years: 30.00    Types: Cigarettes    Quit date: 03/04/2007  . Smokeless tobacco: Never Used  . Alcohol use 0.0 oz/week     Comment: occas  . Drug use: No  . Sexual activity: Not Asked   Other Topics Concern  . None   Social History Narrative  . None     Family History:  The patient's family history includes Heart attack in her father.   ROS:   Please see the history of present illness.    ROS All other systems reviewed and are negative.  No flowsheet data found.     PHYSICAL EXAM:   VS:  BP 132/80   Pulse 69   Ht 5\' 3"  (1.6 m)   Wt 208 lb 1.9 oz (94.4 kg)   SpO2 94%   BMI 36.87 kg/m    GEN: Well nourished, well developed, in no acute distress  HEENT: normal  Neck: no JVD, carotid bruits, or masses Cardiac: RRR; no murmurs, rubs, or gallops,no edema.  Intact distal pulses bilaterally.  Respiratory:  clear to auscultation bilaterally, normal work of breathing GI: soft, nontender, nondistended, + BS MS: no deformity or atrophy  Skin: warm and dry, no rash Neuro:  Alert and Oriented x 3, Strength and sensation are intact Psych: euthymic mood, full affect  Wt Readings from Last 3 Encounters:  11/28/15 208 lb 1.9 oz (94.4 kg)  10/12/15 212 lb (96.2 kg)  08/03/15 220 lb (99.8 kg)      Studies/Labs Reviewed:   EKG:  EKG is ordered today and showed NSR with trigeminal PVCs.  Recent Labs: 02/20/2015: ALT 18; Hemoglobin 11.6; Platelets 319 02/27/2015: Magnesium 2.0 10/12/2015: BUN 31; Creatinine, Ser 1.40; Potassium 3.7; Sodium 138   Lipid Panel    Component Value Date/Time   CHOL  02/11/2007 0745    138        ATP III CLASSIFICATION:  <200     mg/dL   Desirable  200-239  mg/dL   Borderline High  >=240    mg/dL   High   TRIG 60 02/11/2007 0745   HDL 44 02/11/2007 0745   CHOLHDL 3.1 02/11/2007 0745   VLDL  12 02/11/2007 0745   [place]  02/11/2007 0745    82        Total Cholesterol/HDL:CHD Risk Coronary Heart Disease Risk Table                     Men   Women  1/2 Average Risk   3.4   3.3    Additional studies/ records that were reviewed today include:  non    ASSESSMENT:    1. OSA (obstructive sleep apnea)   2. Essential hypertension   3. Obesity (BMI 30-39.9)   4. PVC's (premature ventricular contractions)      PLAN:  In order of problems listed above:  1.  OSA - the patient is tolerating PAP therapy well without any problems.  The patient has been using and benefiting from CPAP use and will continue to benefit from therapy. I will get a download from [facility]. 2.  HTN - BP controlled on current meds.  Continue amlodipine/diuretics. 3.  Obesity - I have encouraged him to get into a routine exercise program and cut back on carbs and portions.  4.  PVC's - trigeminal - asymptomatic    Medication Adjustments/Labs and Tests Ordered: Current medicines are reviewed at length with the patient today.  Concerns regarding medicines are outlined above.  Medication changes, Labs and Tests ordered today are listed in the Patient Instructions below.  Patient Instructions  Medication Instructions:  Your physician recommends that you continue on your current medications as directed. Please refer to the Current Medication list given to you today.   Labwork: None  Testing/Procedures: None  Follow-Up: Your physician wants you to follow-up in: 1 year with Dr. Radford Pax. You will receive a reminder letter in the mail two months in advance. If you don't receive a letter, please call our office to schedule the follow-up appointment.   Any Other Special Instructions Will Be Listed Below (If Applicable).     If you need a refill on your cardiac medications before your next appointment, please call your pharmacy.      Signed, Fransico Him, MD  11/29/2015 8:14 PM    [facility] [place], [place], [place]  [address] Phone: [phone]; Fax: [phone]

## 2015-11-29 ENCOUNTER — Encounter: Payer: Self-pay | Admitting: Cardiology

## 2015-12-04 ENCOUNTER — Other Ambulatory Visit: Payer: Self-pay | Admitting: *Deleted

## 2015-12-04 DIAGNOSIS — G4733 Obstructive sleep apnea (adult) (pediatric): Secondary | ICD-10-CM

## 2016-01-03 ENCOUNTER — Encounter: Payer: Self-pay | Admitting: Cardiology

## 2016-01-09 DIAGNOSIS — E113392 Type 2 diabetes mellitus with moderate nonproliferative diabetic retinopathy without macular edema, left eye: Secondary | ICD-10-CM | POA: Diagnosis not present

## 2016-01-09 DIAGNOSIS — E113311 Type 2 diabetes mellitus with moderate nonproliferative diabetic retinopathy with macular edema, right eye: Secondary | ICD-10-CM | POA: Diagnosis not present

## 2016-01-09 DIAGNOSIS — H43813 Vitreous degeneration, bilateral: Secondary | ICD-10-CM | POA: Diagnosis not present

## 2016-01-09 DIAGNOSIS — H3582 Retinal ischemia: Secondary | ICD-10-CM | POA: Diagnosis not present

## 2016-01-16 DIAGNOSIS — D125 Benign neoplasm of sigmoid colon: Secondary | ICD-10-CM | POA: Diagnosis not present

## 2016-01-16 DIAGNOSIS — D122 Benign neoplasm of ascending colon: Secondary | ICD-10-CM | POA: Diagnosis not present

## 2016-01-16 DIAGNOSIS — D120 Benign neoplasm of cecum: Secondary | ICD-10-CM | POA: Diagnosis not present

## 2016-01-16 DIAGNOSIS — D126 Benign neoplasm of colon, unspecified: Secondary | ICD-10-CM | POA: Diagnosis not present

## 2016-01-16 DIAGNOSIS — Z1211 Encounter for screening for malignant neoplasm of colon: Secondary | ICD-10-CM | POA: Diagnosis not present

## 2016-01-16 DIAGNOSIS — K5730 Diverticulosis of large intestine without perforation or abscess without bleeding: Secondary | ICD-10-CM | POA: Diagnosis not present

## 2016-01-22 DIAGNOSIS — D126 Benign neoplasm of colon, unspecified: Secondary | ICD-10-CM | POA: Diagnosis not present

## 2016-01-22 DIAGNOSIS — Z1211 Encounter for screening for malignant neoplasm of colon: Secondary | ICD-10-CM | POA: Diagnosis not present

## 2016-02-13 DIAGNOSIS — H401131 Primary open-angle glaucoma, bilateral, mild stage: Secondary | ICD-10-CM | POA: Diagnosis not present

## 2016-02-13 DIAGNOSIS — H52223 Regular astigmatism, bilateral: Secondary | ICD-10-CM | POA: Diagnosis not present

## 2016-02-13 DIAGNOSIS — E113392 Type 2 diabetes mellitus with moderate nonproliferative diabetic retinopathy without macular edema, left eye: Secondary | ICD-10-CM | POA: Diagnosis not present

## 2016-02-13 DIAGNOSIS — H35033 Hypertensive retinopathy, bilateral: Secondary | ICD-10-CM | POA: Diagnosis not present

## 2016-02-13 DIAGNOSIS — E113311 Type 2 diabetes mellitus with moderate nonproliferative diabetic retinopathy with macular edema, right eye: Secondary | ICD-10-CM | POA: Diagnosis not present

## 2016-02-13 DIAGNOSIS — H524 Presbyopia: Secondary | ICD-10-CM | POA: Diagnosis not present

## 2016-02-13 DIAGNOSIS — H2513 Age-related nuclear cataract, bilateral: Secondary | ICD-10-CM | POA: Diagnosis not present

## 2016-02-13 DIAGNOSIS — H04123 Dry eye syndrome of bilateral lacrimal glands: Secondary | ICD-10-CM | POA: Diagnosis not present

## 2016-03-10 DIAGNOSIS — Z794 Long term (current) use of insulin: Secondary | ICD-10-CM | POA: Diagnosis not present

## 2016-03-10 DIAGNOSIS — E119 Type 2 diabetes mellitus without complications: Secondary | ICD-10-CM | POA: Diagnosis not present

## 2016-03-10 DIAGNOSIS — J209 Acute bronchitis, unspecified: Secondary | ICD-10-CM | POA: Diagnosis not present

## 2016-03-10 DIAGNOSIS — Z7984 Long term (current) use of oral hypoglycemic drugs: Secondary | ICD-10-CM | POA: Diagnosis not present

## 2016-04-14 DIAGNOSIS — M9901 Segmental and somatic dysfunction of cervical region: Secondary | ICD-10-CM | POA: Diagnosis not present

## 2016-04-14 DIAGNOSIS — M5032 Other cervical disc degeneration, mid-cervical region, unspecified level: Secondary | ICD-10-CM | POA: Diagnosis not present

## 2016-04-14 DIAGNOSIS — M9902 Segmental and somatic dysfunction of thoracic region: Secondary | ICD-10-CM | POA: Diagnosis not present

## 2016-04-14 DIAGNOSIS — M5134 Other intervertebral disc degeneration, thoracic region: Secondary | ICD-10-CM | POA: Diagnosis not present

## 2016-04-15 DIAGNOSIS — I10 Essential (primary) hypertension: Secondary | ICD-10-CM | POA: Diagnosis not present

## 2016-04-15 DIAGNOSIS — M2550 Pain in unspecified joint: Secondary | ICD-10-CM | POA: Diagnosis not present

## 2016-04-15 DIAGNOSIS — E119 Type 2 diabetes mellitus without complications: Secondary | ICD-10-CM | POA: Diagnosis not present

## 2016-04-15 DIAGNOSIS — E0520 Thyrotoxicosis with toxic multinodular goiter without thyrotoxic crisis or storm: Secondary | ICD-10-CM | POA: Diagnosis not present

## 2016-04-16 DIAGNOSIS — M5134 Other intervertebral disc degeneration, thoracic region: Secondary | ICD-10-CM | POA: Diagnosis not present

## 2016-04-16 DIAGNOSIS — M9902 Segmental and somatic dysfunction of thoracic region: Secondary | ICD-10-CM | POA: Diagnosis not present

## 2016-04-16 DIAGNOSIS — M9901 Segmental and somatic dysfunction of cervical region: Secondary | ICD-10-CM | POA: Diagnosis not present

## 2016-04-16 DIAGNOSIS — M5032 Other cervical disc degeneration, mid-cervical region, unspecified level: Secondary | ICD-10-CM | POA: Diagnosis not present

## 2016-04-18 DIAGNOSIS — M5032 Other cervical disc degeneration, mid-cervical region, unspecified level: Secondary | ICD-10-CM | POA: Diagnosis not present

## 2016-04-18 DIAGNOSIS — M9901 Segmental and somatic dysfunction of cervical region: Secondary | ICD-10-CM | POA: Diagnosis not present

## 2016-04-18 DIAGNOSIS — M9902 Segmental and somatic dysfunction of thoracic region: Secondary | ICD-10-CM | POA: Diagnosis not present

## 2016-04-18 DIAGNOSIS — M5134 Other intervertebral disc degeneration, thoracic region: Secondary | ICD-10-CM | POA: Diagnosis not present

## 2016-04-22 DIAGNOSIS — M9901 Segmental and somatic dysfunction of cervical region: Secondary | ICD-10-CM | POA: Diagnosis not present

## 2016-04-22 DIAGNOSIS — M5134 Other intervertebral disc degeneration, thoracic region: Secondary | ICD-10-CM | POA: Diagnosis not present

## 2016-04-22 DIAGNOSIS — M9902 Segmental and somatic dysfunction of thoracic region: Secondary | ICD-10-CM | POA: Diagnosis not present

## 2016-04-22 DIAGNOSIS — M5032 Other cervical disc degeneration, mid-cervical region, unspecified level: Secondary | ICD-10-CM | POA: Diagnosis not present

## 2016-04-23 DIAGNOSIS — M9901 Segmental and somatic dysfunction of cervical region: Secondary | ICD-10-CM | POA: Diagnosis not present

## 2016-04-23 DIAGNOSIS — M5134 Other intervertebral disc degeneration, thoracic region: Secondary | ICD-10-CM | POA: Diagnosis not present

## 2016-04-23 DIAGNOSIS — M5032 Other cervical disc degeneration, mid-cervical region, unspecified level: Secondary | ICD-10-CM | POA: Diagnosis not present

## 2016-04-23 DIAGNOSIS — M9902 Segmental and somatic dysfunction of thoracic region: Secondary | ICD-10-CM | POA: Diagnosis not present

## 2016-04-25 DIAGNOSIS — M5134 Other intervertebral disc degeneration, thoracic region: Secondary | ICD-10-CM | POA: Diagnosis not present

## 2016-04-25 DIAGNOSIS — M5032 Other cervical disc degeneration, mid-cervical region, unspecified level: Secondary | ICD-10-CM | POA: Diagnosis not present

## 2016-04-25 DIAGNOSIS — M9902 Segmental and somatic dysfunction of thoracic region: Secondary | ICD-10-CM | POA: Diagnosis not present

## 2016-04-25 DIAGNOSIS — M9901 Segmental and somatic dysfunction of cervical region: Secondary | ICD-10-CM | POA: Diagnosis not present

## 2016-04-28 DIAGNOSIS — M5134 Other intervertebral disc degeneration, thoracic region: Secondary | ICD-10-CM | POA: Diagnosis not present

## 2016-04-28 DIAGNOSIS — M5032 Other cervical disc degeneration, mid-cervical region, unspecified level: Secondary | ICD-10-CM | POA: Diagnosis not present

## 2016-04-28 DIAGNOSIS — M9902 Segmental and somatic dysfunction of thoracic region: Secondary | ICD-10-CM | POA: Diagnosis not present

## 2016-04-28 DIAGNOSIS — M9901 Segmental and somatic dysfunction of cervical region: Secondary | ICD-10-CM | POA: Diagnosis not present

## 2016-04-30 DIAGNOSIS — M5032 Other cervical disc degeneration, mid-cervical region, unspecified level: Secondary | ICD-10-CM | POA: Diagnosis not present

## 2016-04-30 DIAGNOSIS — M9902 Segmental and somatic dysfunction of thoracic region: Secondary | ICD-10-CM | POA: Diagnosis not present

## 2016-04-30 DIAGNOSIS — M5134 Other intervertebral disc degeneration, thoracic region: Secondary | ICD-10-CM | POA: Diagnosis not present

## 2016-04-30 DIAGNOSIS — M9901 Segmental and somatic dysfunction of cervical region: Secondary | ICD-10-CM | POA: Diagnosis not present

## 2016-05-02 DIAGNOSIS — M5032 Other cervical disc degeneration, mid-cervical region, unspecified level: Secondary | ICD-10-CM | POA: Diagnosis not present

## 2016-05-02 DIAGNOSIS — M5134 Other intervertebral disc degeneration, thoracic region: Secondary | ICD-10-CM | POA: Diagnosis not present

## 2016-05-02 DIAGNOSIS — M9901 Segmental and somatic dysfunction of cervical region: Secondary | ICD-10-CM | POA: Diagnosis not present

## 2016-05-02 DIAGNOSIS — M9902 Segmental and somatic dysfunction of thoracic region: Secondary | ICD-10-CM | POA: Diagnosis not present

## 2016-05-05 DIAGNOSIS — M9901 Segmental and somatic dysfunction of cervical region: Secondary | ICD-10-CM | POA: Diagnosis not present

## 2016-05-05 DIAGNOSIS — M5032 Other cervical disc degeneration, mid-cervical region, unspecified level: Secondary | ICD-10-CM | POA: Diagnosis not present

## 2016-05-05 DIAGNOSIS — M5134 Other intervertebral disc degeneration, thoracic region: Secondary | ICD-10-CM | POA: Diagnosis not present

## 2016-05-05 DIAGNOSIS — M19032 Primary osteoarthritis, left wrist: Secondary | ICD-10-CM | POA: Diagnosis not present

## 2016-05-05 DIAGNOSIS — M19022 Primary osteoarthritis, left elbow: Secondary | ICD-10-CM | POA: Diagnosis not present

## 2016-05-05 DIAGNOSIS — M9902 Segmental and somatic dysfunction of thoracic region: Secondary | ICD-10-CM | POA: Diagnosis not present

## 2016-05-07 DIAGNOSIS — M5032 Other cervical disc degeneration, mid-cervical region, unspecified level: Secondary | ICD-10-CM | POA: Diagnosis not present

## 2016-05-07 DIAGNOSIS — M9902 Segmental and somatic dysfunction of thoracic region: Secondary | ICD-10-CM | POA: Diagnosis not present

## 2016-05-07 DIAGNOSIS — M5134 Other intervertebral disc degeneration, thoracic region: Secondary | ICD-10-CM | POA: Diagnosis not present

## 2016-05-07 DIAGNOSIS — M9901 Segmental and somatic dysfunction of cervical region: Secondary | ICD-10-CM | POA: Diagnosis not present

## 2016-05-08 DIAGNOSIS — E0590 Thyrotoxicosis, unspecified without thyrotoxic crisis or storm: Secondary | ICD-10-CM | POA: Diagnosis not present

## 2016-05-08 DIAGNOSIS — G4733 Obstructive sleep apnea (adult) (pediatric): Secondary | ICD-10-CM | POA: Diagnosis not present

## 2016-05-08 DIAGNOSIS — E785 Hyperlipidemia, unspecified: Secondary | ICD-10-CM | POA: Diagnosis not present

## 2016-05-08 DIAGNOSIS — L723 Sebaceous cyst: Secondary | ICD-10-CM | POA: Diagnosis not present

## 2016-05-08 DIAGNOSIS — E559 Vitamin D deficiency, unspecified: Secondary | ICD-10-CM | POA: Diagnosis not present

## 2016-05-08 DIAGNOSIS — E1339 Other specified diabetes mellitus with other diabetic ophthalmic complication: Secondary | ICD-10-CM | POA: Diagnosis not present

## 2016-05-08 DIAGNOSIS — E119 Type 2 diabetes mellitus without complications: Secondary | ICD-10-CM | POA: Diagnosis not present

## 2016-05-08 DIAGNOSIS — I10 Essential (primary) hypertension: Secondary | ICD-10-CM | POA: Diagnosis not present

## 2016-05-08 DIAGNOSIS — I509 Heart failure, unspecified: Secondary | ICD-10-CM | POA: Diagnosis not present

## 2016-05-08 DIAGNOSIS — E1129 Type 2 diabetes mellitus with other diabetic kidney complication: Secondary | ICD-10-CM | POA: Diagnosis not present

## 2016-05-08 DIAGNOSIS — R809 Proteinuria, unspecified: Secondary | ICD-10-CM | POA: Diagnosis not present

## 2016-05-08 DIAGNOSIS — E11319 Type 2 diabetes mellitus with unspecified diabetic retinopathy without macular edema: Secondary | ICD-10-CM | POA: Diagnosis not present

## 2016-05-09 ENCOUNTER — Telehealth: Payer: Self-pay | Admitting: Interventional Cardiology

## 2016-05-09 NOTE — Telephone Encounter (Signed)
NOTES SENT TO SCHEDULING FOR APPT WITH DR. VARANASI.

## 2016-05-12 DIAGNOSIS — M5032 Other cervical disc degeneration, mid-cervical region, unspecified level: Secondary | ICD-10-CM | POA: Diagnosis not present

## 2016-05-12 DIAGNOSIS — M9901 Segmental and somatic dysfunction of cervical region: Secondary | ICD-10-CM | POA: Diagnosis not present

## 2016-05-12 DIAGNOSIS — M9902 Segmental and somatic dysfunction of thoracic region: Secondary | ICD-10-CM | POA: Diagnosis not present

## 2016-05-12 DIAGNOSIS — M5134 Other intervertebral disc degeneration, thoracic region: Secondary | ICD-10-CM | POA: Diagnosis not present

## 2016-05-14 DIAGNOSIS — H3582 Retinal ischemia: Secondary | ICD-10-CM | POA: Diagnosis not present

## 2016-05-14 DIAGNOSIS — M5032 Other cervical disc degeneration, mid-cervical region, unspecified level: Secondary | ICD-10-CM | POA: Diagnosis not present

## 2016-05-14 DIAGNOSIS — E113311 Type 2 diabetes mellitus with moderate nonproliferative diabetic retinopathy with macular edema, right eye: Secondary | ICD-10-CM | POA: Diagnosis not present

## 2016-05-14 DIAGNOSIS — E113392 Type 2 diabetes mellitus with moderate nonproliferative diabetic retinopathy without macular edema, left eye: Secondary | ICD-10-CM | POA: Diagnosis not present

## 2016-05-14 DIAGNOSIS — M9901 Segmental and somatic dysfunction of cervical region: Secondary | ICD-10-CM | POA: Diagnosis not present

## 2016-05-14 DIAGNOSIS — H348312 Tributary (branch) retinal vein occlusion, right eye, stable: Secondary | ICD-10-CM | POA: Diagnosis not present

## 2016-05-14 DIAGNOSIS — M5134 Other intervertebral disc degeneration, thoracic region: Secondary | ICD-10-CM | POA: Diagnosis not present

## 2016-05-14 DIAGNOSIS — M9902 Segmental and somatic dysfunction of thoracic region: Secondary | ICD-10-CM | POA: Diagnosis not present

## 2016-05-16 DIAGNOSIS — M9902 Segmental and somatic dysfunction of thoracic region: Secondary | ICD-10-CM | POA: Diagnosis not present

## 2016-05-16 DIAGNOSIS — M9901 Segmental and somatic dysfunction of cervical region: Secondary | ICD-10-CM | POA: Diagnosis not present

## 2016-05-16 DIAGNOSIS — M5032 Other cervical disc degeneration, mid-cervical region, unspecified level: Secondary | ICD-10-CM | POA: Diagnosis not present

## 2016-05-16 DIAGNOSIS — M5134 Other intervertebral disc degeneration, thoracic region: Secondary | ICD-10-CM | POA: Diagnosis not present

## 2016-05-20 DIAGNOSIS — M9902 Segmental and somatic dysfunction of thoracic region: Secondary | ICD-10-CM | POA: Diagnosis not present

## 2016-05-20 DIAGNOSIS — M5032 Other cervical disc degeneration, mid-cervical region, unspecified level: Secondary | ICD-10-CM | POA: Diagnosis not present

## 2016-05-20 DIAGNOSIS — M5134 Other intervertebral disc degeneration, thoracic region: Secondary | ICD-10-CM | POA: Diagnosis not present

## 2016-05-20 DIAGNOSIS — M9901 Segmental and somatic dysfunction of cervical region: Secondary | ICD-10-CM | POA: Diagnosis not present

## 2016-05-23 ENCOUNTER — Encounter (HOSPITAL_BASED_OUTPATIENT_CLINIC_OR_DEPARTMENT_OTHER): Payer: Self-pay | Admitting: *Deleted

## 2016-05-23 ENCOUNTER — Inpatient Hospital Stay (HOSPITAL_BASED_OUTPATIENT_CLINIC_OR_DEPARTMENT_OTHER)
Admission: EM | Admit: 2016-05-23 | Discharge: 2016-05-28 | DRG: 200 | Disposition: A | Discharge status: Home / Self Care | Payer: Medicare Other | Attending: Emergency Medicine | Admitting: Emergency Medicine

## 2016-05-23 ENCOUNTER — Emergency Department (HOSPITAL_BASED_OUTPATIENT_CLINIC_OR_DEPARTMENT_OTHER): Payer: Medicare Other

## 2016-05-23 DIAGNOSIS — M5032 Other cervical disc degeneration, mid-cervical region, unspecified level: Secondary | ICD-10-CM | POA: Diagnosis not present

## 2016-05-23 DIAGNOSIS — Z794 Long term (current) use of insulin: Secondary | ICD-10-CM

## 2016-05-23 DIAGNOSIS — E876 Hypokalemia: Secondary | ICD-10-CM | POA: Diagnosis present

## 2016-05-23 DIAGNOSIS — M9902 Segmental and somatic dysfunction of thoracic region: Secondary | ICD-10-CM | POA: Diagnosis not present

## 2016-05-23 DIAGNOSIS — S2239XA Fracture of one rib, unspecified side, initial encounter for closed fracture: Secondary | ICD-10-CM

## 2016-05-23 DIAGNOSIS — M5134 Other intervertebral disc degeneration, thoracic region: Secondary | ICD-10-CM | POA: Diagnosis not present

## 2016-05-23 DIAGNOSIS — E871 Hypo-osmolality and hyponatremia: Secondary | ICD-10-CM | POA: Diagnosis present

## 2016-05-23 DIAGNOSIS — N179 Acute kidney failure, unspecified: Secondary | ICD-10-CM | POA: Diagnosis present

## 2016-05-23 DIAGNOSIS — I110 Hypertensive heart disease with heart failure: Secondary | ICD-10-CM | POA: Diagnosis present

## 2016-05-23 DIAGNOSIS — I50810 Right heart failure, unspecified: Secondary | ICD-10-CM | POA: Diagnosis present

## 2016-05-23 DIAGNOSIS — Z7982 Long term (current) use of aspirin: Secondary | ICD-10-CM

## 2016-05-23 DIAGNOSIS — I493 Ventricular premature depolarization: Secondary | ICD-10-CM | POA: Diagnosis present

## 2016-05-23 DIAGNOSIS — H409 Unspecified glaucoma: Secondary | ICD-10-CM | POA: Diagnosis present

## 2016-05-23 DIAGNOSIS — S2242XA Multiple fractures of ribs, left side, initial encounter for closed fracture: Secondary | ICD-10-CM | POA: Diagnosis not present

## 2016-05-23 DIAGNOSIS — M9901 Segmental and somatic dysfunction of cervical region: Secondary | ICD-10-CM | POA: Diagnosis not present

## 2016-05-23 DIAGNOSIS — E113599 Type 2 diabetes mellitus with proliferative diabetic retinopathy without macular edema, unspecified eye: Secondary | ICD-10-CM | POA: Diagnosis present

## 2016-05-23 DIAGNOSIS — S270XXA Traumatic pneumothorax, initial encounter: Secondary | ICD-10-CM

## 2016-05-23 DIAGNOSIS — Z9884 Bariatric surgery status: Secondary | ICD-10-CM

## 2016-05-23 DIAGNOSIS — J90 Pleural effusion, not elsewhere classified: Secondary | ICD-10-CM | POA: Diagnosis not present

## 2016-05-23 DIAGNOSIS — E0520 Thyrotoxicosis with toxic multinodular goiter without thyrotoxic crisis or storm: Secondary | ICD-10-CM | POA: Diagnosis present

## 2016-05-23 DIAGNOSIS — J939 Pneumothorax, unspecified: Secondary | ICD-10-CM | POA: Diagnosis present

## 2016-05-23 DIAGNOSIS — S2232XA Fracture of one rib, left side, initial encounter for closed fracture: Secondary | ICD-10-CM

## 2016-05-23 DIAGNOSIS — I272 Pulmonary hypertension, unspecified: Secondary | ICD-10-CM | POA: Diagnosis present

## 2016-05-23 DIAGNOSIS — Z881 Allergy status to other antibiotic agents status: Secondary | ICD-10-CM

## 2016-05-23 DIAGNOSIS — S2249XA Multiple fractures of ribs, unspecified side, initial encounter for closed fracture: Secondary | ICD-10-CM

## 2016-05-23 DIAGNOSIS — Z888 Allergy status to other drugs, medicaments and biological substances status: Secondary | ICD-10-CM

## 2016-05-23 DIAGNOSIS — J449 Chronic obstructive pulmonary disease, unspecified: Secondary | ICD-10-CM | POA: Diagnosis present

## 2016-05-23 DIAGNOSIS — Z79899 Other long term (current) drug therapy: Secondary | ICD-10-CM

## 2016-05-23 DIAGNOSIS — D72829 Elevated white blood cell count, unspecified: Secondary | ICD-10-CM | POA: Diagnosis not present

## 2016-05-23 DIAGNOSIS — W01190A Fall on same level from slipping, tripping and stumbling with subsequent striking against furniture, initial encounter: Secondary | ICD-10-CM | POA: Diagnosis present

## 2016-05-23 DIAGNOSIS — Z043 Encounter for examination and observation following other accident: Secondary | ICD-10-CM | POA: Diagnosis not present

## 2016-05-23 DIAGNOSIS — Z87891 Personal history of nicotine dependence: Secondary | ICD-10-CM

## 2016-05-23 DIAGNOSIS — Z8249 Family history of ischemic heart disease and other diseases of the circulatory system: Secondary | ICD-10-CM

## 2016-05-23 DIAGNOSIS — E669 Obesity, unspecified: Secondary | ICD-10-CM | POA: Diagnosis present

## 2016-05-23 DIAGNOSIS — Y92531 Health care provider office as the place of occurrence of the external cause: Secondary | ICD-10-CM

## 2016-05-23 DIAGNOSIS — G4733 Obstructive sleep apnea (adult) (pediatric): Secondary | ICD-10-CM | POA: Diagnosis present

## 2016-05-23 DIAGNOSIS — E782 Mixed hyperlipidemia: Secondary | ICD-10-CM | POA: Diagnosis present

## 2016-05-23 DIAGNOSIS — Z6835 Body mass index (BMI) 35.0-35.9, adult: Secondary | ICD-10-CM

## 2016-05-23 DIAGNOSIS — W19XXXA Unspecified fall, initial encounter: Secondary | ICD-10-CM

## 2016-05-23 LAB — CBC
HCT: 32.3 % — ABNORMAL LOW (ref 36.0–46.0)
Hemoglobin: 10.3 g/dL — ABNORMAL LOW (ref 12.0–15.0)
MCH: 27.1 pg (ref 26.0–34.0)
MCHC: 31.9 g/dL (ref 30.0–36.0)
MCV: 85 fL (ref 78.0–100.0)
PLATELETS: 468 10*3/uL — AB (ref 150–400)
RBC: 3.8 MIL/uL — ABNORMAL LOW (ref 3.87–5.11)
RDW: 15.7 % — AB (ref 11.5–15.5)
WBC: 9.6 10*3/uL (ref 4.0–10.5)

## 2016-05-23 LAB — BASIC METABOLIC PANEL
Anion gap: 16 — ABNORMAL HIGH (ref 5–15)
BUN: 29 mg/dL — AB (ref 6–20)
CO2: 31 mmol/L (ref 22–32)
CREATININE: 1.68 mg/dL — AB (ref 0.44–1.00)
Calcium: 8.6 mg/dL — ABNORMAL LOW (ref 8.9–10.3)
Chloride: 89 mmol/L — ABNORMAL LOW (ref 101–111)
GFR, EST AFRICAN AMERICAN: 36 mL/min — AB (ref >60)
GFR, EST NON AFRICAN AMERICAN: 31 mL/min — AB (ref >60)
Glucose, Bld: 252 mg/dL — ABNORMAL HIGH (ref 65–99)
POTASSIUM: 3.1 mmol/L — AB (ref 3.5–5.1)
SODIUM: 136 mmol/L (ref 135–145)

## 2016-05-23 LAB — GLUCOSE, CAPILLARY: Glucose-Capillary: 234 mg/dL — ABNORMAL HIGH (ref 65–99)

## 2016-05-23 LAB — CBG MONITORING, ED: Glucose-Capillary: 212 mg/dL — ABNORMAL HIGH (ref 65–99)

## 2016-05-23 MED ORDER — METFORMIN HCL 850 MG PO TABS: 850.0000 mg | ORAL_TABLET | Freq: Two times a day (BID) | ORAL | Status: DC

## 2016-05-23 MED ORDER — BRIMONIDINE TARTRATE 0.15 % OP SOLN
1.0000 [drp] | Freq: Two times a day (BID) | OPHTHALMIC | Status: DC
  Administered 2016-05-23 – 2016-05-28 (×10): 1 [drp] via OPHTHALMIC
  Filled 2016-05-23: qty 5

## 2016-05-23 MED ORDER — OXYCODONE HCL 5 MG PO TABS
5.0000 mg | ORAL_TABLET | ORAL | Status: DC | PRN
  Administered 2016-05-23: 5 mg via ORAL
  Administered 2016-05-24 – 2016-05-25 (×8): 10 mg via ORAL
  Administered 2016-05-26 (×2): 5 mg via ORAL
  Administered 2016-05-26 – 2016-05-28 (×4): 10 mg via ORAL
  Filled 2016-05-23 (×8): qty 2
  Filled 2016-05-23 (×2): qty 1
  Filled 2016-05-23 (×4): qty 2
  Filled 2016-05-23: qty 1
  Filled 2016-05-23: qty 2

## 2016-05-23 MED ORDER — ATORVASTATIN CALCIUM 40 MG PO TABS
40.0000 mg | ORAL_TABLET | Freq: Every day | ORAL | Status: DC
  Administered 2016-05-24 – 2016-05-27 (×3): 40 mg via ORAL
  Filled 2016-05-23 (×4): qty 1

## 2016-05-23 MED ORDER — INSULIN ASPART 100 UNIT/ML ~~LOC~~ SOLN
0.0000 [IU] | Freq: Three times a day (TID) | SUBCUTANEOUS | Status: DC
  Administered 2016-05-24 (×2): 3 [IU] via SUBCUTANEOUS
  Administered 2016-05-24: 5 [IU] via SUBCUTANEOUS
  Administered 2016-05-25: 3 [IU] via SUBCUTANEOUS
  Administered 2016-05-25: 5 [IU] via SUBCUTANEOUS
  Administered 2016-05-25 – 2016-05-26 (×2): 3 [IU] via SUBCUTANEOUS
  Administered 2016-05-26: 8 [IU] via SUBCUTANEOUS
  Administered 2016-05-26 – 2016-05-27 (×2): 3 [IU] via SUBCUTANEOUS
  Administered 2016-05-27: 5 [IU] via SUBCUTANEOUS
  Administered 2016-05-28: 2 [IU] via SUBCUTANEOUS

## 2016-05-23 MED ORDER — ENOXAPARIN SODIUM 40 MG/0.4ML ~~LOC~~ SOLN
40.0000 mg | SUBCUTANEOUS | Status: DC
  Administered 2016-05-24 – 2016-05-26 (×3): 40 mg via SUBCUTANEOUS
  Filled 2016-05-23 (×3): qty 0.4

## 2016-05-23 MED ORDER — LATANOPROST 0.005 % OP SOLN
1.0000 [drp] | Freq: Every day | OPHTHALMIC | Status: DC
  Administered 2016-05-23 – 2016-05-27 (×5): 1 [drp] via OPHTHALMIC
  Filled 2016-05-23: qty 2.5

## 2016-05-23 MED ORDER — TIMOLOL MALEATE 0.5 % OP SOLN
1.0000 [drp] | Freq: Every day | OPHTHALMIC | Status: DC
  Administered 2016-05-24 – 2016-05-28 (×5): 1 [drp] via OPHTHALMIC
  Filled 2016-05-23: qty 5

## 2016-05-23 MED ORDER — ACETAMINOPHEN 650 MG RE SUPP: 650.0000 mg | Freq: Four times a day (QID) | RECTAL | Status: DC | PRN

## 2016-05-23 MED ORDER — AMLODIPINE BESYLATE 10 MG PO TABS
10.0000 mg | ORAL_TABLET | Freq: Every day | ORAL | Status: DC
  Administered 2016-05-24 – 2016-05-28 (×5): 10 mg via ORAL
  Filled 2016-05-23 (×5): qty 1

## 2016-05-23 MED ORDER — ONDANSETRON 4 MG PO TBDP: 4.0000 mg | ORAL_TABLET | Freq: Four times a day (QID) | ORAL | Status: DC | PRN

## 2016-05-23 MED ORDER — SIMETHICONE 80 MG PO CHEW: 40.0000 mg | CHEWABLE_TABLET | Freq: Four times a day (QID) | ORAL | Status: DC | PRN

## 2016-05-23 MED ORDER — ONDANSETRON HCL 4 MG/2ML IJ SOLN
4.0000 mg | Freq: Once | INTRAMUSCULAR | Status: AC
  Administered 2016-05-23: 4 mg via INTRAVENOUS
  Filled 2016-05-23: qty 2

## 2016-05-23 MED ORDER — PREDNISONE 20 MG PO TABS: 20.0000 mg | ORAL_TABLET | Freq: Every day | ORAL | Status: DC

## 2016-05-23 MED ORDER — ACETAMINOPHEN 325 MG PO TABS
650.0000 mg | ORAL_TABLET | Freq: Four times a day (QID) | ORAL | Status: DC | PRN
  Administered 2016-05-25: 650 mg via ORAL
  Filled 2016-05-23: qty 2

## 2016-05-23 MED ORDER — MORPHINE SULFATE (PF) 2 MG/ML IV SOLN: 2.0000 mg | INTRAVENOUS | Status: DC | PRN

## 2016-05-23 MED ORDER — MORPHINE SULFATE (PF) 4 MG/ML IV SOLN
4.0000 mg | Freq: Once | INTRAVENOUS | Status: AC
  Administered 2016-05-23: 4 mg via INTRAVENOUS
  Filled 2016-05-23: qty 1

## 2016-05-23 MED ORDER — SPIRONOLACTONE 5 MG/ML ORAL SUSPENSION
6.2500 mg | Freq: Every day | ORAL | Status: DC
  Administered 2016-05-24 – 2016-05-28 (×5): 6.5 mg via ORAL
  Filled 2016-05-23 (×6): qty 1.3

## 2016-05-23 MED ORDER — POLYVINYL ALCOHOL 1.4 % OP SOLN: 1.0000 [drp] | Freq: Three times a day (TID) | OPHTHALMIC | Status: DC | PRN

## 2016-05-23 MED ORDER — ONDANSETRON HCL 4 MG/2ML IJ SOLN: 4.0000 mg | Freq: Four times a day (QID) | INTRAMUSCULAR | Status: DC | PRN

## 2016-05-23 MED ORDER — CARBOXYMETHYLCELLULOSE SODIUM 0.5 % OP SOLN: 1.0000 [drp] | Freq: Three times a day (TID) | OPHTHALMIC | Status: DC | PRN

## 2016-05-23 MED ORDER — METHOCARBAMOL 500 MG PO TABS
500.0000 mg | ORAL_TABLET | Freq: Four times a day (QID) | ORAL | Status: DC | PRN
  Administered 2016-05-23 – 2016-05-25 (×6): 500 mg via ORAL
  Filled 2016-05-23 (×6): qty 1

## 2016-05-23 MED ORDER — SODIUM CHLORIDE 0.9 % IV SOLN
INTRAVENOUS | Status: DC
  Administered 2016-05-24: 10 mL/h via INTRAVENOUS

## 2016-05-23 MED ORDER — SPIRONOLACTONE 25 MG PO TABS: 6.2500 mg | ORAL_TABLET | Freq: Every day | ORAL | Status: DC

## 2016-05-23 NOTE — ED Notes (Signed)
Pt states she tripped at her chiropractor office and hit the chairs, knocked the air out of her,

## 2016-05-23 NOTE — ED Notes (Signed)
Pt's neighbor dropped pt off. Please call about 64mins before discharge for him to return to [facility].   Delight Ovens [phone]

## 2016-05-23 NOTE — ED Notes (Signed)
This rn phones bed control for update on bed status, Shirlean Mylar states that a patient has been discharged and she is waiting for a room to be cleaned.

## 2016-05-23 NOTE — Consult Note (Signed)
[facility] Surgery Admission Note  Kathleen [name] 26-Oct-1950  [id number].    Requesting MD: Flonnie Overman, PA-C Chief Complaint/Reason for Consult: Pneumothorax, fractured ribs  HPI:   Kathleen [name] is a 66 year old female with an extensive PMHx as noted below. She was admitted to the hospital earlier today following a fall resulting in two left rib fractures.  The patient was leaving her chiropractor's office around 10:30am when she tripped on the rug. She states she landed on her left side against a chair and then on the floor. She states that she did not lose consciousness or hit her head. She reports that she was helped up and brought into the ED.   The patient states that she has had continued pain in her left lateral chest since this morning, however it has increased after being transferred from the ED. The pain does not radiate to any other sites. She describes it as a sharp pain that is worsened with touch and with movement. Her pain is rated currently at a 9/10. She reports dyspnea with movement. She denies any fever chills, palpitations, SOB at rest, abdominal pain, nausea, vomiting, diarrhea.   ROS: Left chest pain All systems reviewed and otherwise negative except for as above  Family History  Problem Relation Age of Onset  . Heart attack Father     Past Medical History:  Diagnosis Date  . Carotid artery plaque    mild to moderate less tan 50% stenosis on u/s 2/14  . Complication of anesthesia    pt states stopped breathing when having tonsillectomy at age 51; pt states has had anesthesia since then without difficulty   . Diabetes mellitus   . Glaucoma   . Heart disease   . Hyperlipidemia   . Hypertension   . Menopause   . Mixed hyperlipidemia 09/22/2013  . Morbid obesity ([place])   . Multinodular goiter    AND HYPERTHYROIDISM   . Obesity (BMI 30-39.9) 04/30/2015  . OSA on CPAP   . Proliferative retinopathy    moderate, Dr. Posey Pronto  . Proteinuria    Dr.  Hassell Done  . Pulmonary hypertension   . Right-sided heart failure   . Thyroid disease    pt currently on no medications   . Trigger finger    Dr. Rip Harbour    Past Surgical History:  Procedure Laterality Date  . GASTRIC BANDING PORT REVISION N/A 02/27/2015   Procedure: GASTRIC BANDING PORT REVISION;  Surgeon: Johnathan Hausen, MD;  Location: [facility];  Service: General;  Laterality: N/A;  . LAPAROSCOPIC GASTRIC BANDING  2010  . NERVE SURGERY  2001  . SPLIT NIGHT STUDY  08/03/2015  . TONSILLECTOMY  1958    Social History:  reports that she quit smoking about 9 years ago. Her smoking use included Cigarettes. She has a 30.00 pack-year smoking history. She has never used smokeless tobacco. She reports that she drinks alcohol. She reports that she does not use drugs.  Allergies:  Allergies  Allergen Reactions  . Ace Inhibitors Swelling    Mouth area  . Benicar [Olmesartan] Other (See Comments)    Angioedema  . Epinephrine Hcl     Hives   . Levaquin [Levofloxacin In D5w]     angioedema    Medications Prior to Admission  Medication Sig Dispense Refill  . ALPHAGAN P 0.15 % ophthalmic solution Place 1 drop into both eyes 2 (two) times daily.     Marland Kitchen amLODipine (NORVASC) 10 MG tablet Take 10  mg by mouth daily.    Marland Kitchen ascorbic acid (VITAMIN C) 1000 MG tablet Take 1,000 mg by mouth 2 (two) times daily.    Marland Kitchen aspirin 81 MG tablet Take 81 mg by mouth daily.      Marland Kitchen atorvastatin (LIPITOR) 40 MG tablet Take 40 mg by mouth daily.      . Calcium Carbonate-Vitamin D (CALCIUM-VITAMIN D) 500-200 MG-UNIT tablet Take 1 tablet by mouth 2 (two) times daily.    . carboxymethylcellulose (REFRESH PLUS) 0.5 % SOLN Place 1 drop into both eyes 3 (three) times daily as needed (dry eyes).    . chlorthalidone (HYGROTON) 25 MG tablet Take 25 mg by mouth daily.    Marland Kitchen latanoprost (XALATAN) 0.005 % ophthalmic solution Place 1 drop into both eyes at bedtime.     Marland Kitchen LEVEMIR FLEXPEN 100 UNIT/ML SOPN Inject 10-20 Units into the skin  daily at 10 pm. If blood sugar > 100 take 20 units, but takes less if lower than 100    . MEGARED OMEGA-3 KRILL OIL PO Take 1 capsule by mouth daily.    . metFORMIN (GLUCOPHAGE) 850 MG tablet Take 850 mg by mouth 2 (two) times daily with a meal.      . methimazole (TAPAZOLE) 10 MG tablet Take 5 mg by mouth daily. Take on Tuesday and Saturday    . Multiple Vitamin (MULTIVITAMIN) capsule Take 1 capsule by mouth daily.      Marland Kitchen NOVOLOG FLEXPEN 100 UNIT/ML injection Inject 0-18 Units into the skin 3 (three) times daily with meals. Based on sliding scale    . ONE TOUCH ULTRA TEST test strip     . predniSONE (DELTASONE) 10 MG tablet Take 2 tablets (20 mg total) by mouth daily. 15 tablet 0  . spironolactone (ALDACTONE) 25 MG tablet Take 6.25 mg by mouth daily.    . timolol (TIMOPTIC) 0.5 % ophthalmic solution Place 1 drop into both eyes daily.       Blood pressure (!) 122/54, pulse 95, temperature 98.9 F (37.2 C), temperature source Oral, resp. rate 18, height 5' 3"  (1.6 m), weight 89.8 kg (198 lb), SpO2 98 %. Physical Exam: General: pleasant, WD/WN female, laying in bed in NAD. Currently on oxygen therapy. Mild discomfort during exam/movement. HEENT: head is normocephalic, atraumatic.  PERRL, buccal mucosa is moist and pink Heart: regular, rate, and rhythm.  No obvious murmurs, gallops, or rubs noted.  Lungs: Diminished breath sounds in the lower left lobe. Clear to auscultation in remaining lung fields.  Respiratory effort nonlabored, left lateral chest is tender to palpation Abd: soft, NT/ND, +BS, no masses noted MS: No cyanosis, clubbing, or edema. Skin: warm and dry Psych: A&Ox3 with an appropriate affect.   Results for orders placed or performed during the hospital encounter of 05/23/16 (from the past 48 hour(s))  Basic metabolic panel     Status: Abnormal   Collection Time: 05/23/16  6:18 PM  Result Value Ref Range   Sodium 136 135 - 145 mmol/L   Potassium 3.1 (L) 3.5 - 5.1 mmol/L    Chloride 89 (L) 101 - 111 mmol/L   CO2 31 22 - 32 mmol/L   Glucose, Bld 252 (H) 65 - 99 mg/dL   BUN 29 (H) 6 - 20 mg/dL   Creatinine, Ser 1.68 (H) 0.44 - 1.00 mg/dL   Calcium 8.6 (L) 8.9 - 10.3 mg/dL   GFR calc non Af Amer 31 (L) >60 mL/min   GFR calc Af Amer 36 (L) >60 mL/min  Comment: (NOTE) The eGFR has been calculated using the CKD EPI equation. This calculation has not been validated in all clinical situations. eGFR's persistently <60 mL/min signify possible Chronic Kidney Disease.    Anion gap 16 (H) 5 - 15  CBC     Status: Abnormal   Collection Time: 05/23/16  6:18 PM  Result Value Ref Range   WBC 9.6 4.0 - 10.5 K/uL   RBC 3.80 (L) 3.87 - 5.11 MIL/uL   Hemoglobin 10.3 (L) 12.0 - 15.0 g/dL   HCT 32.3 (L) 36.0 - 46.0 %   MCV 85.0 78.0 - 100.0 fL   MCH 27.1 26.0 - 34.0 pg   MCHC 31.9 30.0 - 36.0 g/dL   RDW 15.7 (H) 11.5 - 15.5 %   Platelets 468 (H) 150 - 400 K/uL  CBG monitoring, ED     Status: Abnormal   Collection Time: 05/23/16  7:14 PM  Result Value Ref Range   Glucose-Capillary 212 (H) 65 - 99 mg/dL   Dg Ribs Unilateral W/chest Left  Result Date: 05/23/2016 CLINICAL DATA:  Fall. EXAM: LEFT RIBS AND CHEST - 3+ VIEW COMPARISON:  01/22/2010. FINDINGS: Mediastinum and hilar structures normal. Cardiomegaly with normal pulmonary vascularity. Mild left mid and lower lung infiltrate and/or contusion with small left pleural effusion. Left posterior sixth and seventh rib fractures are noted. These are slightly displaced. Subtle left basilar pneumothorax cannot be excluded. Diffuse degenerative change thoracic spine. Prior lap band surgery. IMPRESSION: 1. Left posterior left sixth and seventh rib fractures are present. These are slightly displaced. Subtle left basilar pneumothorax cannot be excluded . 2. Mild left base infiltrate and/or contusion. Small left pleural effusion. Critical Value/emergent results were called by telephone at the time of interpretation on 05/23/2016 at 1:13  pm to Dr. Nanda Quinton , who verbally acknowledged these results. Electronically Signed   By: Marcello Moores  Register   On: 05/23/2016 13:17      Assessment/Plan 1. Fractured ribs (2) with pneumothorax and small pleural effusion: - Start pain management regimen using tylenol, oxycodone and robaxin - Continue pulmonary toilet - incentive spirometry, coughing, OOB as tolerated. - Plan on repeat CXR in the morning to reevaluate condition.  - Continue oxygen therapy and monitor vital signs regularly; Continuous telemetry in place   [facility], PA-S   Rib fractures on exam and on plain film.  No real ptx seen.  Otherwise without any concerns on exam.  Will admit, plan for pulmonary toilet, repeat chest xray in am, pain control, needs to be oob, will dc home when pain controlled and able to adequately perform pulmonary toilet

## 2016-05-23 NOTE — ED Notes (Signed)
Pt will go to [facility] room 6N01-01

## 2016-05-23 NOTE — ED Provider Notes (Signed)
[facility] DEPT [facility] Provider Note   CSN: [id number] Arrival date & time: 05/23/16  1153     History   Chief Complaint Chief Complaint  Patient presents with  . Fall    HPI Kathleen [name] is a 66 y.o. female with PMHx of DM, HTN, morbid obesity, pulmonary HTN presents today presents today with pain in her left ribs. She reports constant, worsening, 8/10. She reports associated pain on deep inspiration, certain movements, and pressing area. He reports associated left cheek and nose pain stating she thinks she may have hit her left side with her hand when she was bracing during the fall.  She reports tripping on a rug at her chiropractor's office and fell into the arm of chair. She denies chest pain, shortness of breath, fever, chills, nausea, vomiting, diarrhea, head injury, visual symptoms, changes in gait. She has tried tylenol with mild relief. She denies taking any blood thinners. She does admit to taking a baby aspirin a day.   The history is provided by the patient. No language interpreter was used.  Fall  Pertinent negatives include no chest pain, no abdominal pain and no shortness of breath.    Past Medical History:  Diagnosis Date  . Carotid artery plaque    mild to moderate less tan 50% stenosis on u/s 2/14  . Complication of anesthesia    pt states stopped breathing when having tonsillectomy at age 66; pt states has had anesthesia since then without difficulty   . Diabetes mellitus   . Glaucoma   . Heart disease   . Hyperlipidemia   . Hypertension   . Menopause   . Mixed hyperlipidemia 09/22/2013  . Morbid obesity ([place])   . Multinodular goiter    AND HYPERTHYROIDISM   . Obesity (BMI 30-39.9) 04/30/2015  . OSA on CPAP   . Proliferative retinopathy    moderate, Dr. Posey Pronto  . Proteinuria    Dr. Hassell Done  . Pulmonary hypertension   . Right-sided heart failure   . Thyroid disease    pt currently on no medications   . Trigger finger    Dr. Rip Harbour    Patient  Active Problem List   Diagnosis Date Noted  . Pneumothorax on left 05/23/2016  . PVC's (premature ventricular contractions) 11/28/2015  . OSA (obstructive sleep apnea) 04/30/2015  . Obesity (BMI 30-39.9) 04/30/2015  . Status post gastric banding 02/27/2015  . Varicose veins of leg with complications 40/34/7425  . Metatarsal deformity 10/04/2013  . Nonspecific abnormal unspecified cardiovascular function study 09/29/2013  . Mixed hyperlipidemia 09/22/2013  . Tenosynovitis of foot and ankle 09/14/2013  . Pronation deformity of ankle, acquired 09/14/2013  . Deformity of metatarsal bone of right foot 09/14/2013  . Deformity of metatarsal bone of left foot 09/14/2013  . Proteinuria 07/28/2012  . DYSPNEA 01/11/2008  . HYPERTHYROIDISM 05/10/2007  . UNSPECIFIED DISORDER OF THYROID 05/06/2007  . DIABETES, TYPE 2 03/22/2007  . HYPERLIPIDEMIA 03/22/2007  . Essential hypertension 03/22/2007  . C O P D 03/22/2007  . OBSTRUCTIVE SLEEP APNEA 03/22/2007  . MEDIASTINAL ADENOPATHY CASTLEMANS D 03/22/2007  . OXYGEN-USE OF SUPPLEMENTAL 03/22/2007    Past Surgical History:  Procedure Laterality Date  . GASTRIC BANDING PORT REVISION N/A 02/27/2015   Procedure: GASTRIC BANDING PORT REVISION;  Surgeon: Johnathan Hausen, MD;  Location: [facility] ORS;  Service: General;  Laterality: N/A;  . LAPAROSCOPIC GASTRIC BANDING  2010  . NERVE SURGERY  2001  . SPLIT NIGHT STUDY  08/03/2015  . TONSILLECTOMY  1958    OB History    No data available       Home Medications    Prior to Admission medications   Medication Sig Start Date End Date Taking? Authorizing Provider  ALPHAGAN P 0.15 % ophthalmic solution Place 1 drop into both eyes 2 (two) times daily.  05/02/12   Historical Provider, MD  amLODipine (NORVASC) 10 MG tablet Take 10 mg by mouth daily.    Historical Provider, MD  ascorbic acid (VITAMIN C) 1000 MG tablet Take 1,000 mg by mouth 2 (two) times daily.    Historical Provider, MD  aspirin 81 MG tablet Take  81 mg by mouth daily.      Historical Provider, MD  atorvastatin (LIPITOR) 40 MG tablet Take 40 mg by mouth daily.      Historical Provider, MD  Calcium Carbonate-Vitamin D (CALCIUM-VITAMIN D) 500-200 MG-UNIT tablet Take 1 tablet by mouth 2 (two) times daily.    Historical Provider, MD  carboxymethylcellulose (REFRESH PLUS) 0.5 % SOLN Place 1 drop into both eyes 3 (three) times daily as needed (dry eyes).    Historical Provider, MD  chlorthalidone (HYGROTON) 25 MG tablet Take 25 mg by mouth daily.    Historical Provider, MD  latanoprost (XALATAN) 0.005 % ophthalmic solution Place 1 drop into both eyes at bedtime.  04/20/12   Historical Provider, MD  LEVEMIR FLEXPEN 100 UNIT/ML SOPN Inject 10-20 Units into the skin daily at 10 pm. If blood sugar > 100 take 20 units, but takes less if lower than 100 05/03/12   Historical Provider, MD  MEGARED OMEGA-3 KRILL OIL PO Take 1 capsule by mouth daily.    Historical Provider, MD  metFORMIN (GLUCOPHAGE) 850 MG tablet Take 850 mg by mouth 2 (two) times daily with a meal.      Historical Provider, MD  methimazole (TAPAZOLE) 10 MG tablet Take 5 mg by mouth daily. Take on Tuesday and Saturday    Historical Provider, MD  Multiple Vitamin (MULTIVITAMIN) capsule Take 1 capsule by mouth daily.      Historical Provider, MD  NOVOLOG FLEXPEN 100 UNIT/ML injection Inject 0-18 Units into the skin 3 (three) times daily with meals. Based on sliding scale 05/03/12   Historical Provider, MD  ONE TOUCH ULTRA TEST test strip  05/11/12   Historical Provider, MD  predniSONE (DELTASONE) 10 MG tablet Take 2 tablets (20 mg total) by mouth daily. 10/12/15   Margette Fast, MD  spironolactone (ALDACTONE) 25 MG tablet Take 6.25 mg by mouth daily.    Historical Provider, MD  timolol (TIMOPTIC) 0.5 % ophthalmic solution Place 1 drop into both eyes daily.  05/02/12   Historical Provider, MD    Family History Family History  Problem Relation Age of Onset  . Heart attack Father     Social  History Social History  Substance Use Topics  . Smoking status: Former Smoker    Packs/day: 1.00    Years: 30.00    Types: Cigarettes    Quit date: 03/04/2007  . Smokeless tobacco: Never Used  . Alcohol use 0.0 oz/week     Comment: occas     Allergies   Ace inhibitors; Benicar [olmesartan]; Epinephrine hcl; and Levaquin [levofloxacin in d5w]   Review of Systems Review of Systems  Constitutional: Negative for chills and fever.  Eyes: Negative for photophobia and visual disturbance.  Respiratory: Negative for cough and shortness of breath.        Pain on deep inspiration  Cardiovascular: Negative  for chest pain.  Gastrointestinal: Negative for abdominal pain, diarrhea, nausea and vomiting.  Genitourinary: Negative for difficulty urinating and dysuria.  Musculoskeletal: Positive for arthralgias and myalgias. Negative for neck pain and neck stiffness.       Pain over area of ribs  Skin: Negative for rash and wound.  Neurological: Negative for numbness.  All other systems reviewed and are negative.    Physical Exam Updated Vital Signs BP (!) 121/98   Pulse 63   Temp 98.6 F (37 C) (Oral)   Resp 20   Ht 5\' 3"  (1.6 m)   Wt 89.8 kg   SpO2 98%   BMI 35.07 kg/m   Physical Exam  Constitutional: She is oriented to person, place, and time. She appears well-developed and well-nourished. No distress.  Well appearing  HENT:  Head: Normocephalic and atraumatic.  Nose: Nose normal.  Eyes: Conjunctivae and EOM are normal.  Neck: Normal range of motion.  Cardiovascular: Normal rate, normal heart sounds and intact distal pulses.   No murmur heard. Pulmonary/Chest: Effort normal. No respiratory distress. She has no wheezes.  Normal work of breathing. No respiratory distress noted. Slight decreased breath sounds on left.  Abdominal: Soft. There is no tenderness. There is no rebound and no guarding.  Musculoskeletal: Normal range of motion. She exhibits no deformity.  Tenderness  to palpation to left lower ribs. No obvious wound or deformity. No redness or swelling noted. No tenderness to neck. No tenderness redness or swelling to midline cervical, thoracic, or lumbar spine.   Neurological: She is alert and oriented to person, place, and time.  Sensation intact. Muscle strength 5/5 to all 4 extremities.   Skin: Skin is warm. Capillary refill takes less than 2 seconds.  Psychiatric: She has a normal mood and affect. Her behavior is normal.  Nursing note and vitals reviewed.    ED Treatments / Results  Labs (all labs ordered are listed, but only abnormal results are displayed) Labs Reviewed  BASIC METABOLIC PANEL  CBC    EKG  EKG Interpretation None       Radiology Dg Ribs Unilateral W/chest Left  Result Date: 05/23/2016 CLINICAL DATA:  Fall. EXAM: LEFT RIBS AND CHEST - 3+ VIEW COMPARISON:  01/22/2010. FINDINGS: Mediastinum and hilar structures normal. Cardiomegaly with normal pulmonary vascularity. Mild left mid and lower lung infiltrate and/or contusion with small left pleural effusion. Left posterior sixth and seventh rib fractures are noted. These are slightly displaced. Subtle left basilar pneumothorax cannot be excluded. Diffuse degenerative change thoracic spine. Prior lap band surgery. IMPRESSION: 1. Left posterior left sixth and seventh rib fractures are present. These are slightly displaced. Subtle left basilar pneumothorax cannot be excluded . 2. Mild left base infiltrate and/or contusion. Small left pleural effusion. Critical Value/emergent results were called by telephone at the time of interpretation on 05/23/2016 at 1:13 pm to Dr. Nanda Quinton , who verbally acknowledged these results. Electronically Signed   By: Marcello Moores  Register   On: 05/23/2016 13:17    Procedures Procedures (including critical care time)  Medications Ordered in ED Medications - No data to display   Initial Impression / Assessment and Plan / ED Course  I have reviewed the  triage vital signs and the nursing notes.  Pertinent labs & imaging results that were available during my care of the patient were reviewed by me and considered in my medical decision making (see chart for details).    Patient here with multiple rib fractures and possible small pneumothorax  on x-ray. Patient also shows a possible small pleural effusion on xray. Patient here is in no apparent distress, afebrile, hemodynamically stable. Heart sounds are clear. She has minimally decreased sounds on the left side, otherwise clear lung sounds. Her abdomen is soft and nontender. She has tenderness and guarding to her left rib cage.  Case discussed with Dr. Laverta Baltimore who agreed with assessment and plan.  14:40 I spoke with Dr. Grandville Silos in trauma surgery who agreed to admit patient to [facility] for observation overnight.   Final Clinical Impressions(s) / ED Diagnoses   Final diagnoses:  Traumatic pneumothorax, initial encounter  Closed fracture of multiple ribs of left side, initial encounter  Fall, initial encounter    New Prescriptions New Prescriptions   No medications on file     [place], [place] 05/23/16 [place], [place] 05/23/16 [place], [place] 05/23/16 2009

## 2016-05-23 NOTE — ED Notes (Signed)
Attempted to give report to nurse at 1940, 2000 and 2008. Nurse unavailable.

## 2016-05-23 NOTE — ED Notes (Signed)
Pt is having pain in her left rib area. Requesting pain medication.

## 2016-05-23 NOTE — ED Triage Notes (Signed)
She tripped on a rug and fell into a chair. Pain in her left ribs. Pain when she takes a deep breath.

## 2016-05-24 ENCOUNTER — Observation Stay (HOSPITAL_COMMUNITY): Payer: Medicare Other

## 2016-05-24 DIAGNOSIS — E876 Hypokalemia: Secondary | ICD-10-CM | POA: Diagnosis present

## 2016-05-24 DIAGNOSIS — E871 Hypo-osmolality and hyponatremia: Secondary | ICD-10-CM | POA: Diagnosis present

## 2016-05-24 DIAGNOSIS — Z881 Allergy status to other antibiotic agents status: Secondary | ICD-10-CM | POA: Diagnosis not present

## 2016-05-24 DIAGNOSIS — W01190A Fall on same level from slipping, tripping and stumbling with subsequent striking against furniture, initial encounter: Secondary | ICD-10-CM | POA: Diagnosis present

## 2016-05-24 DIAGNOSIS — Y92531 Health care provider office as the place of occurrence of the external cause: Secondary | ICD-10-CM | POA: Diagnosis not present

## 2016-05-24 DIAGNOSIS — I493 Ventricular premature depolarization: Secondary | ICD-10-CM | POA: Diagnosis present

## 2016-05-24 DIAGNOSIS — H409 Unspecified glaucoma: Secondary | ICD-10-CM | POA: Diagnosis present

## 2016-05-24 DIAGNOSIS — J90 Pleural effusion, not elsewhere classified: Secondary | ICD-10-CM | POA: Diagnosis present

## 2016-05-24 DIAGNOSIS — D72829 Elevated white blood cell count, unspecified: Secondary | ICD-10-CM | POA: Diagnosis not present

## 2016-05-24 DIAGNOSIS — I272 Pulmonary hypertension, unspecified: Secondary | ICD-10-CM | POA: Diagnosis present

## 2016-05-24 DIAGNOSIS — I110 Hypertensive heart disease with heart failure: Secondary | ICD-10-CM | POA: Diagnosis present

## 2016-05-24 DIAGNOSIS — I517 Cardiomegaly: Secondary | ICD-10-CM | POA: Diagnosis not present

## 2016-05-24 DIAGNOSIS — Z9884 Bariatric surgery status: Secondary | ICD-10-CM | POA: Diagnosis not present

## 2016-05-24 DIAGNOSIS — S270XXA Traumatic pneumothorax, initial encounter: Secondary | ICD-10-CM | POA: Diagnosis present

## 2016-05-24 DIAGNOSIS — Z888 Allergy status to other drugs, medicaments and biological substances status: Secondary | ICD-10-CM | POA: Diagnosis not present

## 2016-05-24 DIAGNOSIS — J449 Chronic obstructive pulmonary disease, unspecified: Secondary | ICD-10-CM | POA: Diagnosis present

## 2016-05-24 DIAGNOSIS — R079 Chest pain, unspecified: Secondary | ICD-10-CM | POA: Diagnosis not present

## 2016-05-24 DIAGNOSIS — E0520 Thyrotoxicosis with toxic multinodular goiter without thyrotoxic crisis or storm: Secondary | ICD-10-CM | POA: Diagnosis present

## 2016-05-24 DIAGNOSIS — E669 Obesity, unspecified: Secondary | ICD-10-CM | POA: Diagnosis present

## 2016-05-24 DIAGNOSIS — I50810 Right heart failure, unspecified: Secondary | ICD-10-CM | POA: Diagnosis present

## 2016-05-24 DIAGNOSIS — N179 Acute kidney failure, unspecified: Secondary | ICD-10-CM | POA: Diagnosis present

## 2016-05-24 DIAGNOSIS — Z6835 Body mass index (BMI) 35.0-35.9, adult: Secondary | ICD-10-CM | POA: Diagnosis not present

## 2016-05-24 DIAGNOSIS — E113599 Type 2 diabetes mellitus with proliferative diabetic retinopathy without macular edema, unspecified eye: Secondary | ICD-10-CM | POA: Diagnosis present

## 2016-05-24 DIAGNOSIS — E782 Mixed hyperlipidemia: Secondary | ICD-10-CM | POA: Diagnosis present

## 2016-05-24 DIAGNOSIS — Z8249 Family history of ischemic heart disease and other diseases of the circulatory system: Secondary | ICD-10-CM | POA: Diagnosis not present

## 2016-05-24 DIAGNOSIS — Z87891 Personal history of nicotine dependence: Secondary | ICD-10-CM | POA: Diagnosis not present

## 2016-05-24 DIAGNOSIS — G4733 Obstructive sleep apnea (adult) (pediatric): Secondary | ICD-10-CM | POA: Diagnosis present

## 2016-05-24 DIAGNOSIS — S2242XA Multiple fractures of ribs, left side, initial encounter for closed fracture: Secondary | ICD-10-CM | POA: Diagnosis present

## 2016-05-24 LAB — CBC
HEMATOCRIT: 29.4 % — AB (ref 36.0–46.0)
Hemoglobin: 9.2 g/dL — ABNORMAL LOW (ref 12.0–15.0)
MCH: 26.7 pg (ref 26.0–34.0)
MCHC: 31.3 g/dL (ref 30.0–36.0)
MCV: 85.2 fL (ref 78.0–100.0)
Platelets: 411 10*3/uL — ABNORMAL HIGH (ref 150–400)
RBC: 3.45 MIL/uL — AB (ref 3.87–5.11)
RDW: 15.6 % — ABNORMAL HIGH (ref 11.5–15.5)
WBC: 9.1 10*3/uL (ref 4.0–10.5)

## 2016-05-24 LAB — BASIC METABOLIC PANEL
Anion gap: 16 — ABNORMAL HIGH (ref 5–15)
BUN: 27 mg/dL — AB (ref 6–20)
CHLORIDE: 89 mmol/L — AB (ref 101–111)
CO2: 32 mmol/L (ref 22–32)
Calcium: 8.4 mg/dL — ABNORMAL LOW (ref 8.9–10.3)
Creatinine, Ser: 1.98 mg/dL — ABNORMAL HIGH (ref 0.44–1.00)
GFR calc non Af Amer: 25 mL/min — ABNORMAL LOW (ref >60)
GFR, EST AFRICAN AMERICAN: 29 mL/min — AB (ref >60)
Glucose, Bld: 254 mg/dL — ABNORMAL HIGH (ref 65–99)
POTASSIUM: 3.5 mmol/L (ref 3.5–5.1)
SODIUM: 137 mmol/L (ref 135–145)

## 2016-05-24 LAB — GLUCOSE, CAPILLARY
GLUCOSE-CAPILLARY: 227 mg/dL — AB (ref 65–99)
GLUCOSE-CAPILLARY: 191 mg/dL — AB (ref 65–99)
GLUCOSE-CAPILLARY: 162 mg/dL — AB (ref 65–99)
Glucose-Capillary: 177 mg/dL — ABNORMAL HIGH (ref 65–99)

## 2016-05-24 MED ORDER — WHITE PETROLATUM GEL
Status: AC
  Administered 2016-05-24: 16:00:00
  Filled 2016-05-24: qty 1

## 2016-05-24 MED ORDER — CHLORHEXIDINE GLUCONATE 0.12 % MT SOLN
15.0000 mL | Freq: Two times a day (BID) | OROMUCOSAL | Status: DC
  Administered 2016-05-24 – 2016-05-26 (×5): 15 mL via OROMUCOSAL
  Filled 2016-05-24 (×5): qty 15

## 2016-05-24 MED ORDER — ORAL CARE MOUTH RINSE
15.0000 mL | Freq: Two times a day (BID) | OROMUCOSAL | Status: DC
  Administered 2016-05-24 – 2016-05-25 (×2): 15 mL via OROMUCOSAL

## 2016-05-24 NOTE — Progress Notes (Signed)
Patient is refusing the use of CPAP for tonight stating she will be going home tomorrow. RT informed patient if she changes her mind have RN contact RT. RN at bedside and aware.

## 2016-05-24 NOTE — Progress Notes (Signed)
PHARMACIST - PHYSICIAN COMMUNICATION DR:  Donne Hazel and colleagues CONCERNING:  METFORMIN SAFE ADMINISTRATION POLICY  RECOMMENDATION: Metformin has been placed on DISCONTINUE (rejected order) STATUS and should be reordered only after any of the conditions below are ruled out.  Current Safety recommendations include avoiding metformin for a minimum of 48 hours after the patient's exposure to intravenous contrast media for the following conditions:  . eGFR < 60 ml/min  . Liver disease, alcoholism, heart failure, intra-arterial administration of contrast  DESCRIPTION:  The Pharmacy Committee has adopted a policy that restricts the use of metformin in hospitalized patients until all the contraindications to administration have been ruled out. Specific contraindications are: []  Serum creatinine ? 1.5 for males [x]  Serum creatinine ? 1.4 for females []  Shock, acute MI, sepsis, hypoxemia, dehydration []  Planned administration of intravenous iodinated contrast media when eGFR < 59m/min, Liver Disease, alcoholism, heart failure or intra-arterial administration of contrast []  Heart Failure patients with low EF []  Acute or chronic metabolic acidosis (including DKA)   CSherlon Handing PharmD, BCPS Clinical pharmacist, pager [phone]/24/2018 2:21 AM

## 2016-05-24 NOTE — Progress Notes (Signed)
1120 pt had Bigeminy and some PVC's per telemetry, pt denies chest pain, no SOB. Jackson Latino PA notified.

## 2016-05-24 NOTE — Progress Notes (Signed)
[facility] Progress Note     Subjective: Pt ambulating with ease. States soreness. 1250 on IS. No new complaints. Would like to stay another night.   Objective: Vital signs in last 24 hours: Temp:  [97.9 F (36.6 C)-98.9 F (37.2 C)] 97.9 F (36.6 C) (03/24 0357) Pulse Rate:  [57-95] 57 (03/24 0357) Resp:  [16-20] 18 (03/24 0357) BP: (121-161)/(51-132) 123/51 (03/24 0357) SpO2:  [92 %-100 %] 94 % (03/24 0357) Weight:  [198 lb (89.8 kg)-200 lb 9.6 oz (91 kg)] 200 lb 9.6 oz (91 kg) (03/23 2055) Last BM Date: 05/18/16  Intake/Output from previous day: 03/23 0701 - 03/24 0700 In: -  Out: 400 [Urine:400] Intake/Output this shift: No intake/output data recorded.  PE: Gen:  Alert, NAD, pleasant, cooperative, well appearing Card:  RRR, S1 and S2 normal Pulm:  Rate and effort normal, normal work of breathing, 1250 on IS, few rales noted in left lower lobe, decreased breath sounds on left side, no wheezes or rhonchi noted Skin: no rashes noted, warm and dry  Lab Results:   Recent Labs  05/23/16 1818 05/24/16 0009  WBC 9.6 9.1  HGB 10.3* 9.2*  HCT 32.3* 29.4*  PLT 468* 411*   BMET  Recent Labs  05/23/16 1818 05/24/16 0009  NA 136 137  K 3.1* 3.5  CL 89* 89*  CO2 31 32  GLUCOSE 252* 254*  BUN 29* 27*  CREATININE 1.68* 1.98*  CALCIUM 8.6* 8.4*   PT/INR No results for input(s): LABPROT, INR in the last 72 hours. CMP     Component Value Date/Time   NA 137 05/24/2016 0009   K 3.5 05/24/2016 0009   CL 89 (L) 05/24/2016 0009   CO2 32 05/24/2016 0009   GLUCOSE 254 (H) 05/24/2016 0009   BUN 27 (H) 05/24/2016 0009   CREATININE 1.98 (H) 05/24/2016 0009   CALCIUM 8.4 (L) 05/24/2016 0009   PROT 7.0 02/20/2015 0907   ALBUMIN 3.8 02/20/2015 0907   AST 35 02/20/2015 0907   ALT 18 02/20/2015 0907   ALKPHOS 74 02/20/2015 0907   BILITOT 1.1 02/20/2015 0907   GFRNONAA 25 (L) 05/24/2016 0009   GFRAA 29 (L) 05/24/2016 0009   Lipase  No results found for:  LIPASE     Studies/Results: Dg Ribs Unilateral W/chest Left  Result Date: 05/23/2016 CLINICAL DATA:  Fall. EXAM: LEFT RIBS AND CHEST - 3+ VIEW COMPARISON:  01/22/2010. FINDINGS: Mediastinum and hilar structures normal. Cardiomegaly with normal pulmonary vascularity. Mild left mid and lower lung infiltrate and/or contusion with small left pleural effusion. Left posterior sixth and seventh rib fractures are noted. These are slightly displaced. Subtle left basilar pneumothorax cannot be excluded. Diffuse degenerative change thoracic spine. Prior lap band surgery. IMPRESSION: 1. Left posterior left sixth and seventh rib fractures are present. These are slightly displaced. Subtle left basilar pneumothorax cannot be excluded . 2. Mild left base infiltrate and/or contusion. Small left pleural effusion. Critical Value/emergent results were called by telephone at the time of interpretation on 05/23/2016 at 1:13 pm to Dr. Nanda Quinton , who verbally acknowledged these results. Electronically Signed   By: Marcello Moores  Register   On: 05/23/2016 13:17    Anti-infectives: Anti-infectives    None       Assessment/Plan  DM type II - SSI COPD HTN - norvasc OSA - CPAP at night Obesity PVC's S/P gastric banding  Left posterior 6th and 7th rib fractures with subtle left basilar PTX Small left pleural effusion and contusion -  1250 on IS - repeat chest xray in the AM    LOS: 0 days    Kalman Drape , [facility] 05/24/2016, 7:52 AM Pager: [phone] Consults: [phone] Mon-Fri 7:00 am-4:30 pm Sat-Sun 7:00 am-11:30 am

## 2016-05-25 ENCOUNTER — Inpatient Hospital Stay (HOSPITAL_COMMUNITY): Payer: Medicare Other

## 2016-05-25 DIAGNOSIS — S2232XA Fracture of one rib, left side, initial encounter for closed fracture: Secondary | ICD-10-CM

## 2016-05-25 LAB — GLUCOSE, CAPILLARY
GLUCOSE-CAPILLARY: 192 mg/dL — AB (ref 65–99)
Glucose-Capillary: 235 mg/dL — ABNORMAL HIGH (ref 65–99)
Glucose-Capillary: 175 mg/dL — ABNORMAL HIGH (ref 65–99)
Glucose-Capillary: 189 mg/dL — ABNORMAL HIGH (ref 65–99)

## 2016-05-25 LAB — HEMOGLOBIN A1C
Hgb A1c MFr Bld: 7 % — ABNORMAL HIGH (ref 4.8–5.6)
Mean Plasma Glucose: 154 mg/dL

## 2016-05-25 MED ORDER — TRAMADOL HCL 50 MG PO TABS
50.0000 mg | ORAL_TABLET | Freq: Two times a day (BID) | ORAL | Status: DC
  Administered 2016-05-25: 50 mg via ORAL
  Filled 2016-05-25: qty 1

## 2016-05-25 MED ORDER — MORPHINE SULFATE (PF) 2 MG/ML IV SOLN
2.0000 mg | INTRAVENOUS | Status: DC | PRN
Start: 2016-05-25 — End: 2016-05-28
  Administered 2016-05-25: 2 mg via INTRAVENOUS
  Filled 2016-05-25: qty 1

## 2016-05-25 MED ORDER — TRAMADOL HCL 50 MG PO TABS
50.0000 mg | ORAL_TABLET | Freq: Four times a day (QID) | ORAL | Status: DC
  Administered 2016-05-25 – 2016-05-28 (×11): 50 mg via ORAL
  Filled 2016-05-25 (×12): qty 1

## 2016-05-25 NOTE — Progress Notes (Signed)
[facility] Progress Note     Subjective: Worsening chest pain overnight. No fever, chills, cough. Less on IS today due to pain, 750 vs 1250 yesterday  Objective: Vital signs in last 24 hours: Temp:  [97.5 F (36.4 C)-98.6 F (37 C)] 98.1 F (36.7 C) (03/25 0753) Pulse Rate:  [54-61] 61 (03/25 0753) Resp:  [18-189] 18 (03/25 0753) BP: (121-149)/(46-60) 147/60 (03/25 0753) SpO2:  [92 %-95 %] 92 % (03/25 0753) Last BM Date: 05/18/16  Intake/Output from previous day: 03/24 0701 - 03/25 0700 In: 586 [P.O.:360; I.V.:226] Out: 600 [Urine:600] Intake/Output this shift: No intake/output data recorded.  PE: Gen:  Alert, NAD, pleasant, cooperative, well appearing Card:  normal rate, S1 and S2 normal Pulm:  Rate and effort normal, normal work of breathing, 750 on IS, decreased breath sounds bilaterally in bases with few rales noted, no wheezes or rhonchi noted Skin: no rashes noted, warm and dry  Lab Results:   Recent Labs  05/23/16 1818 05/24/16 0009  WBC 9.6 9.1  HGB 10.3* 9.2*  HCT 32.3* 29.4*  PLT 468* 411*   BMET  Recent Labs  05/23/16 1818 05/24/16 0009  NA 136 137  K 3.1* 3.5  CL 89* 89*  CO2 31 32  GLUCOSE 252* 254*  BUN 29* 27*  CREATININE 1.68* 1.98*  CALCIUM 8.6* 8.4*   PT/INR No results for input(s): LABPROT, INR in the last 72 hours. CMP     Component Value Date/Time   NA 137 05/24/2016 0009   K 3.5 05/24/2016 0009   CL 89 (L) 05/24/2016 0009   CO2 32 05/24/2016 0009   GLUCOSE 254 (H) 05/24/2016 0009   BUN 27 (H) 05/24/2016 0009   CREATININE 1.98 (H) 05/24/2016 0009   CALCIUM 8.4 (L) 05/24/2016 0009   PROT 7.0 02/20/2015 0907   ALBUMIN 3.8 02/20/2015 0907   AST 35 02/20/2015 0907   ALT 18 02/20/2015 0907   ALKPHOS 74 02/20/2015 0907   BILITOT 1.1 02/20/2015 0907   GFRNONAA 25 (L) 05/24/2016 0009   GFRAA 29 (L) 05/24/2016 0009   Lipase  No results found for: LIPASE     Studies/Results: Dg Chest 2 View  Result Date:  05/25/2016 CLINICAL DATA:  Patient with rib and flank pain. Patient status post fall. EXAM: CHEST  2 VIEW COMPARISON:  Chest radiograph 05/24/2016 FINDINGS: Monitoring leads overlie the patient. Stable enlarged cardiac and mediastinal contours with calcification of the thoracic aorta. Increased retrocardiac consolidation. Small to moderate left pleural effusion. Interval development of heterogeneous opacities right mid lung. No pneumothorax. Mid thoracic spine degenerative changes. Re- demonstrated mildly displaced left posterior sixth and seventh rib fractures. IMPRESSION: Increasing retrocardiac consolidation and enlarging small to moderate left pleural effusion. Opacities may represent underlying atelectasis or infection. New right mid lung consolidation which may represent pneumonia in the appropriate clinical setting. Aortic atherosclerosis. Cardiomegaly. Electronically Signed   By: Lovey Newcomer M.D.   On: 05/25/2016 09:32   Dg Chest 2 View  Result Date: 05/24/2016 CLINICAL DATA:  Left rib fractures, evaluate for pneumothorax EXAM: CHEST  2 VIEW COMPARISON:  Chest radiograph from one day prior. FINDINGS: Stable cardiomediastinal silhouette with mild cardiomegaly and aortic atherosclerosis. No pneumothorax. Small left pleural effusion appears increased. No right pleural effusion. No pulmonary edema. Increased left basilar lung opacity. Minute left posterior sixth and seventh rib fractures, not well visualized on these views. IMPRESSION: 1. No pneumothorax. 2. Small left pleural effusion, increased . 3. Increased left basilar lung opacity, favor atelectasis.  4. Stable mild cardiomegaly without overt pulmonary edema. 5. Aortic atherosclerosis. Electronically Signed   By: Ilona Sorrel M.D.   On: 05/24/2016 09:41   Dg Ribs Unilateral W/chest Left  Result Date: 05/23/2016 CLINICAL DATA:  Fall. EXAM: LEFT RIBS AND CHEST - 3+ VIEW COMPARISON:  01/22/2010. FINDINGS: Mediastinum and hilar structures normal.  Cardiomegaly with normal pulmonary vascularity. Mild left mid and lower lung infiltrate and/or contusion with small left pleural effusion. Left posterior sixth and seventh rib fractures are noted. These are slightly displaced. Subtle left basilar pneumothorax cannot be excluded. Diffuse degenerative change thoracic spine. Prior lap band surgery. IMPRESSION: 1. Left posterior left sixth and seventh rib fractures are present. These are slightly displaced. Subtle left basilar pneumothorax cannot be excluded . 2. Mild left base infiltrate and/or contusion. Small left pleural effusion. Critical Value/emergent results were called by telephone at the time of interpretation on 05/23/2016 at 1:13 pm to Dr. Nanda Quinton , who verbally acknowledged these results. Electronically Signed   By: Marcello Moores  Register   On: 05/23/2016 13:17    Anti-infectives: Anti-infectives    None       Assessment/Plan DM type II - SSI COPD HTN - norvasc OSA - CPAP at night Obesity PVC's S/P gastric banding  Left posterior 6th and 7th rib fractures with subtle left basilar PTX Small left pleural effusion and contusion - 750 on IS today with worsening pain - repeat chest xray today showed increased consolidation and enlarged left pleural effusion with new right mid lung consolidation. - no fever or cough  FEN: card modified diet VTE: lovenox ID: none  Plan: pulmonary toilet, monitor O2 sats, pain control - added scheduled tramadol    LOS: 1 day    Kalman Drape , [facility] 05/25/2016, 10:11 AM Pager: [phone] Consults: [phone] Mon-Fri 7:00 am-4:30 pm Sat-Sun 7:00 am-11:30 am

## 2016-05-26 ENCOUNTER — Inpatient Hospital Stay (HOSPITAL_COMMUNITY): Payer: Medicare Other

## 2016-05-26 LAB — CBC
HCT: 30.1 % — ABNORMAL LOW (ref 36.0–46.0)
Hemoglobin: 9.4 g/dL — ABNORMAL LOW (ref 12.0–15.0)
MCH: 26.3 pg (ref 26.0–34.0)
MCHC: 31.2 g/dL (ref 30.0–36.0)
MCV: 84.3 fL (ref 78.0–100.0)
Platelets: 386 10*3/uL (ref 150–400)
RBC: 3.57 MIL/uL — AB (ref 3.87–5.11)
RDW: 15.3 % (ref 11.5–15.5)
WBC: 11 10*3/uL — AB (ref 4.0–10.5)

## 2016-05-26 LAB — BASIC METABOLIC PANEL
Anion gap: 14 (ref 5–15)
BUN: 35 mg/dL — ABNORMAL HIGH (ref 6–20)
CHLORIDE: 81 mmol/L — AB (ref 101–111)
CO2: 33 mmol/L — ABNORMAL HIGH (ref 22–32)
CREATININE: 2.22 mg/dL — AB (ref 0.44–1.00)
Calcium: 8.2 mg/dL — ABNORMAL LOW (ref 8.9–10.3)
GFR, EST AFRICAN AMERICAN: 26 mL/min — AB (ref >60)
GFR, EST NON AFRICAN AMERICAN: 22 mL/min — AB (ref >60)
Glucose, Bld: 219 mg/dL — ABNORMAL HIGH (ref 65–99)
POTASSIUM: 3.4 mmol/L — AB (ref 3.5–5.1)
SODIUM: 128 mmol/L — AB (ref 135–145)

## 2016-05-26 LAB — GLUCOSE, CAPILLARY
GLUCOSE-CAPILLARY: 200 mg/dL — AB (ref 65–99)
Glucose-Capillary: 258 mg/dL — ABNORMAL HIGH (ref 65–99)
Glucose-Capillary: 180 mg/dL — ABNORMAL HIGH (ref 65–99)
Glucose-Capillary: 204 mg/dL — ABNORMAL HIGH (ref 65–99)

## 2016-05-26 MED ORDER — POTASSIUM CHLORIDE CRYS ER 20 MEQ PO TBCR
20.0000 meq | EXTENDED_RELEASE_TABLET | Freq: Two times a day (BID) | ORAL | Status: AC
  Administered 2016-05-26 (×2): 20 meq via ORAL
  Filled 2016-05-26 (×2): qty 1

## 2016-05-26 MED ORDER — ENOXAPARIN SODIUM 30 MG/0.3ML ~~LOC~~ SOLN
30.0000 mg | SUBCUTANEOUS | Status: DC
  Administered 2016-05-27 – 2016-05-28 (×2): 30 mg via SUBCUTANEOUS
  Filled 2016-05-26 (×2): qty 0.3

## 2016-05-26 MED ORDER — ACETAMINOPHEN 650 MG RE SUPP: 650.0000 mg | Freq: Four times a day (QID) | RECTAL | Status: DC

## 2016-05-26 MED ORDER — INSULIN DETEMIR 100 UNIT/ML ~~LOC~~ SOLN
10.0000 [IU] | Freq: Every day | SUBCUTANEOUS | Status: DC
  Administered 2016-05-26 – 2016-05-27 (×2): 20 [IU] via SUBCUTANEOUS
  Filled 2016-05-26 (×6): qty 0.2

## 2016-05-26 MED ORDER — ACETAMINOPHEN 325 MG PO TABS
650.0000 mg | ORAL_TABLET | Freq: Four times a day (QID) | ORAL | Status: DC
  Administered 2016-05-26 – 2016-05-28 (×10): 650 mg via ORAL
  Filled 2016-05-26 (×10): qty 2

## 2016-05-26 MED ORDER — SODIUM CHLORIDE 0.9 % IV SOLN
INTRAVENOUS | Status: DC
  Administered 2016-05-26 – 2016-05-27 (×2): via INTRAVENOUS

## 2016-05-26 NOTE — Progress Notes (Signed)
Pt wash her face before breakfast  Kathleen [name], [place]

## 2016-05-26 NOTE — Progress Notes (Signed)
Inpatient Diabetes Program Recommendations  AACE/ADA: New Consensus Statement on Inpatient Glycemic Control (2015)  Target Ranges:  Prepandial:   less than 140 mg/dL      Peak postprandial:   less than 180 mg/dL (1-2 hours)      Critically ill patients:  140 - 180 mg/dL   Lab Results  Component Value Date   GLUCAP 200 (H) 05/26/2016   HGBA1C 7.0 (H) 05/24/2016   Review of Glycemic Control  Diabetes history: DM 2 Outpatient Diabetes medications: Levemir 10-20 units (20 units of CBG >100), Metformin 850 mg BID with meals, Novolog 0-18 units TID Current orders for Inpatient glycemic control: Levemir 10-20, Novolog Moderate TID  Inpatient Diabetes Program Recommendations:   Consider ordering a set dose for basal insulin, Levemir 15 units qhs.  Thanks,  Tama Headings RN, MSN, [facility] Inpatient Diabetes Coordinator Team Pager [phone] (8a-5p)

## 2016-05-26 NOTE — Progress Notes (Signed)
Pt refuses CPAP 

## 2016-05-26 NOTE — Progress Notes (Signed)
[facility] Progress Note     Subjective: Reports she was "unable to move" yesterday 2/2 pain. c/o back pain as well as left anterior chest wall pain that feels like it is spreading across the rest of her chest. Pain worse with movement/inspiration. Reports decreased appetite. Urinating and having flatus. Denies BM - reports issues with constipation since her lap band. Denies fever, chills, SOB.  When asked about her renal function she reports seeing a nephrologist annually for further BP control and states she has had abnormal kidney function in the past.   Objective: Vital signs in last 24 hours: Temp:  [98 F (36.7 C)-98.9 F (37.2 C)] 98.3 F (36.8 C) (03/26 0452) Pulse Rate:  [57-60] 57 (03/26 0452) Resp:  [18] 18 (03/26 0452) BP: (119-160)/(61-80) 146/80 (03/26 0452) SpO2:  [92 %-98 %] 92 % (03/26 0452) Last BM Date: 05/18/16  Intake/Output from previous day: 03/25 0701 - 03/26 0700 In: -  Out: 1000 [Urine:1000] Intake/Output this shift: No intake/output data recorded.  PE: Gen:  Alert, NAD, pleasant and cooperative HEENT: on 4L [place] Card:  Regular rate and rhythm Pulm:  Clear to auscultation bilaterally, diminished breath sounds in bilateral lung bases; 750 cc on IS. Abd: Soft, obese, non-tender, non-distneded Ext:  No erythema, edema, or tenderness; pedal pulses 2+ bilaterally  Lab Results:   Recent Labs  05/24/16 0009 05/26/16 0542  WBC 9.1 11.0*  HGB 9.2* 9.4*  HCT 29.4* 30.1*  PLT 411* 386   BMET  Recent Labs  05/24/16 0009 05/26/16 0542  NA 137 128*  K 3.5 3.4*  CL 89* 81*  CO2 32 33*  GLUCOSE 254* 219*  BUN 27* 35*  CREATININE 1.98* 2.22*  CALCIUM 8.4* 8.2*   PT/INR No results for input(s): LABPROT, INR in the last 72 hours. CMP     Component Value Date/Time   NA 128 (L) 05/26/2016 0542   K 3.4 (L) 05/26/2016 0542   CL 81 (L) 05/26/2016 0542   CO2 33 (H) 05/26/2016 0542   GLUCOSE 219 (H) 05/26/2016 0542   BUN 35 (H)  05/26/2016 0542   CREATININE 2.22 (H) 05/26/2016 0542   CALCIUM 8.2 (L) 05/26/2016 0542   PROT 7.0 02/20/2015 0907   ALBUMIN 3.8 02/20/2015 0907   AST 35 02/20/2015 0907   ALT 18 02/20/2015 0907   ALKPHOS 74 02/20/2015 0907   BILITOT 1.1 02/20/2015 0907   GFRNONAA 22 (L) 05/26/2016 0542   GFRAA 26 (L) 05/26/2016 0542   Lipase  No results found for: LIPASE     Studies/Results: Dg Chest 2 View  Result Date: 05/25/2016 CLINICAL DATA:  Patient with rib and flank pain. Patient status post fall. EXAM: CHEST  2 VIEW COMPARISON:  Chest radiograph 05/24/2016 FINDINGS: Monitoring leads overlie the patient. Stable enlarged cardiac and mediastinal contours with calcification of the thoracic aorta. Increased retrocardiac consolidation. Small to moderate left pleural effusion. Interval development of heterogeneous opacities right mid lung. No pneumothorax. Mid thoracic spine degenerative changes. Re- demonstrated mildly displaced left posterior sixth and seventh rib fractures. IMPRESSION: Increasing retrocardiac consolidation and enlarging small to moderate left pleural effusion. Opacities may represent underlying atelectasis or infection. New right mid lung consolidation which may represent pneumonia in the appropriate clinical setting. Aortic atherosclerosis. Cardiomegaly. Electronically Signed   By: Lovey Newcomer M.D.   On: 05/25/2016 09:32   Dg Chest 2 View  Result Date: 05/24/2016 CLINICAL DATA:  Left rib fractures, evaluate for pneumothorax EXAM: CHEST  2 VIEW COMPARISON:  Chest radiograph from one day prior. FINDINGS: Stable cardiomediastinal silhouette with mild cardiomegaly and aortic atherosclerosis. No pneumothorax. Small left pleural effusion appears increased. No right pleural effusion. No pulmonary edema. Increased left basilar lung opacity. Minute left posterior sixth and seventh rib fractures, not well visualized on these views. IMPRESSION: 1. No pneumothorax. 2. Small left pleural effusion,  increased . 3. Increased left basilar lung opacity, favor atelectasis. 4. Stable mild cardiomegaly without overt pulmonary edema. 5. Aortic atherosclerosis. Electronically Signed   By: Ilona Sorrel M.D.   On: 05/24/2016 09:41    Anti-infectives: Anti-infectives    None     Assessment/Plan Fall Left posterior 6th and 7th rib fractures with subtle left basilar PTX Small left pleural effusion and contusion - CXR 3/25 w/ increased consolidation and enlarged left pleural effusion and with new right lung consolidation. Afebrile.  - new leukocytosis (11 from 9)   - pain control: Tylenol 650 mg q 6h, Robaxin 500 mg q 6 PRN, oxycodone 5-10 mg q 6h PRN, traMADol 50 mg q 6h   DM type II - SSI, Levemir 10-20 U daily at bedtime  COPD HTN - norvasc OSA - CPAP at night Obesity PVC's S/P gastric banding Hypokalemia - replace PO KCl Hyponatremia - normal saline @ 50 cc/hr AKI - SCr increased, 2.22 from 1.98/1.68, gentle IVF rehydration, d/c nephrotoxic meds    FEN: card modified diet, correct electrolytes as above  VTE: lovenox ID: none  Plan: aggressive pulmonary toilet and pain control; PT eval Follow CXR, CBC and BMET in AM Correct electrolytes, add Levemir for better glycemic control     LOS: 2 days    Jill Alexanders , [facility] 05/26/2016, 7:59 AM Pager: [phone] Consults: [phone] Mon-Fri 7:00 am-4:30 pm Sat-Sun 7:00 am-11:30 am

## 2016-05-27 ENCOUNTER — Inpatient Hospital Stay (HOSPITAL_COMMUNITY): Payer: Medicare Other

## 2016-05-27 LAB — BASIC METABOLIC PANEL
Anion gap: 11 (ref 5–15)
BUN: 31 mg/dL — AB (ref 6–20)
CHLORIDE: 82 mmol/L — AB (ref 101–111)
CO2: 33 mmol/L — AB (ref 22–32)
Calcium: 8.4 mg/dL — ABNORMAL LOW (ref 8.9–10.3)
Creatinine, Ser: 1.67 mg/dL — ABNORMAL HIGH (ref 0.44–1.00)
GFR calc Af Amer: 36 mL/min — ABNORMAL LOW (ref >60)
GFR, EST NON AFRICAN AMERICAN: 31 mL/min — AB (ref >60)
GLUCOSE: 224 mg/dL — AB (ref 65–99)
POTASSIUM: 3.3 mmol/L — AB (ref 3.5–5.1)
Sodium: 126 mmol/L — ABNORMAL LOW (ref 135–145)

## 2016-05-27 LAB — CBC
HEMATOCRIT: 30.4 % — AB (ref 36.0–46.0)
HEMOGLOBIN: 9.7 g/dL — AB (ref 12.0–15.0)
MCH: 26.8 pg (ref 26.0–34.0)
MCHC: 31.9 g/dL (ref 30.0–36.0)
MCV: 84 fL (ref 78.0–100.0)
Platelets: 364 10*3/uL (ref 150–400)
RBC: 3.62 MIL/uL — ABNORMAL LOW (ref 3.87–5.11)
RDW: 15.5 % (ref 11.5–15.5)
WBC: 9.3 10*3/uL (ref 4.0–10.5)

## 2016-05-27 LAB — GLUCOSE, CAPILLARY
Glucose-Capillary: 199 mg/dL — ABNORMAL HIGH (ref 65–99)
Glucose-Capillary: 116 mg/dL — ABNORMAL HIGH (ref 65–99)
Glucose-Capillary: 238 mg/dL — ABNORMAL HIGH (ref 65–99)
Glucose-Capillary: 237 mg/dL — ABNORMAL HIGH (ref 65–99)

## 2016-05-27 NOTE — Progress Notes (Signed)
Physical Therapy Evaluation Patient Details Name: Kathleen [name] MRN: [id number] DOB: 1950/04/26 Today's Date: 05/27/2016   History of Present Illness  66 yo female, post fall on 05/23/16 with Left posterior rib fractures of 6 & 7 with pneumothorax.  Prior Hx includes: heart disease, pulmonary HTN, obesity, OSA with CPAP, DM, HLD.  Clinical Impression  Patient presents with pain from fractures of ribs and altered mobility (needs increased time / effort), and is asymptomatic for desaturation of Oxygen.  Patient was overall 80-93% with or without activity on 4 L/min supplemental O2.  With that exeption, patient is overall Supervision to Modified Independent for mobility.  Educated patient for activity dosing and use of incentive spirometer.  Patient still requiring supplemental O2 at this time,  Remains good rehab candidate, will place on therapy roster to continue O2 education and activity dosing while in hospital.  May return home if medically appropriate.    Follow Up Recommendations Supervision - Intermittent    Equipment Recommendations   (Patient has equipment available)    Recommendations for Other Services OT consult     Precautions / Restrictions Precautions Precautions:  (O2 saturation.) Restrictions Weight Bearing Restrictions: No      Mobility  Bed Mobility Overal bed mobility: Modified Independent             General bed mobility comments: Has adjustable bed at home.  Transfers Overall transfer level: Modified independent Equipment used: Rolling walker (2 wheeled)                Ambulation/Gait Ambulation/Gait assistance: Modified independent (Device/Increase time) Ambulation Distance (Feet): 120 Feet Assistive device: Rolling walker (2 wheeled) Gait Pattern/deviations: Step-through pattern;WFL(Within Functional Limits)     General Gait Details: Limited distances due to O2 monitoring  Stairs            Wheelchair Mobility    Modified  [place] (Stroke Patients Only)       Balance Overall balance assessment: Modified Independent                                           Pertinent Vitals/Pain Pain Assessment: 0-10 Pain Score: 6  Pain Location: Left side trunk Pain Descriptors / Indicators: Aching;Sore Pain Intervention(s): Limited activity within patient's tolerance;Monitored during session;Premedicated before session    [place] expects to be discharged to:: Private residence Living Arrangements: Alone Available Help at Discharge: Available PRN/intermittently (Main assistant is in hospital as well right now.) Type of Home: Apartment Home Access: Level entry     Home Layout: One level Home Equipment: [place] - 2 wheels;Walker - 4 wheels;Grab bars - toilet;Cane - single point (Adjustable bed)      Prior Function Level of Independence: Independent (uses devices as needed.)               Hand Dominance        Extremity/Trunk Assessment   Upper Extremity Assessment Upper Extremity Assessment: Overall WFL for tasks assessed;LUE deficits/detail LUE Deficits / Details: Pain with movement and reaching/pushing with Left arm.    Lower Extremity Assessment Lower Extremity Assessment: Overall WFL for tasks assessed    Cervical / Trunk Assessment Cervical / Trunk Assessment: Normal  Communication   Communication: No difficulties  Cognition Arousal/Alertness: Awake/alert Behavior During Therapy: WFL for tasks assessed/performed Overall Cognitive Status: Within Functional Limits for tasks assessed  General Comments General comments (skin integrity, edema, etc.): O2 saturation range 80-93% with or without activity, on 4 L O2.    Exercises     Assessment/Plan    PT Assessment Patient needs continued PT services  PT Problem List Decreased activity tolerance;Decreased mobility;Cardiopulmonary status limiting  activity;Obesity;Pain       PT Treatment Interventions DME instruction;Gait training;Functional mobility training;Therapeutic activities;Therapeutic exercise;Patient/family education    PT Goals (Current goals can be found in the Care Plan section)  Acute Rehab PT Goals Patient Stated Goal: Breathe better PT Goal Formulation: With patient Time For Goal Achievement: 06/10/16 Potential to Achieve Goals: Good    Frequency Min 3X/week   Barriers to discharge Decreased caregiver support (Little support available.)      Co-evaluation               End of Session Equipment Utilized During Treatment: Gait belt;Oxygen Activity Tolerance: Patient tolerated treatment well (Pain increase with movement affecting L trunk only.) Patient left: in bed;with call bell/phone within reach;with SCD's reapplied Nurse Communication: Mobility status PT Visit Diagnosis: Unsteadiness on feet (R26.81);Pain Pain - Right/Left: Left Pain - part of body:  (Back / ribs)    Time: 1230-1300 PT Time Calculation (min) (ACUTE ONLY): 30 min   Charges:     PT Treatments $Therapeutic Activity: 8-22 mins   PT G Codes:        Judith Blonder, DPT  Zenia Resides, Jessikah Dicker L 05/27/2016, 2:04 PM

## 2016-05-27 NOTE — Progress Notes (Deleted)
Dressing changed. Daughter taught at bedside and teach back used. Daughter performed packing/dressing care and showed competence. All questions answered.

## 2016-05-27 NOTE — Progress Notes (Signed)
Subjective: CC L rib pain  Objective: Vital signs in last 24 hours: Temp:  [98 F (36.7 C)-98.2 F (36.8 C)] 98 F (36.7 C) (03/27 0500) Pulse Rate:  [55-62] 58 (03/27 0500) Resp:  [18] 18 (03/27 0500) BP: (134-159)/(54-80) 159/80 (03/27 0832) SpO2:  [86 %-95 %] 90 % (03/27 0838) Last BM Date:  (patient states that her LBM was about 1 week ago)  Intake/Output from previous day: 03/26 0701 - 03/27 0700 In: 1330 [P.O.:480; I.V.:850] Out: 1750 [Urine:1750] Intake/Output this shift: No intake/output data recorded.  General appearance: cooperative Resp: clear to auscultation bilaterally Chest wall: left sided chest wall tenderness Cardio: regular rate and rhythm GI: soft, NT, lap band post palp  Lab Results: CBC   Recent Labs  05/26/16 0542 05/27/16 0250  WBC 11.0* 9.3  HGB 9.4* 9.7*  HCT 30.1* 30.4*  PLT 386 364   BMET  Recent Labs  05/26/16 0542 05/27/16 0250  NA 128* 126*  K 3.4* 3.3*  CL 81* 82*  CO2 33* 33*  GLUCOSE 219* 224*  BUN 35* 31*  CREATININE 2.22* 1.67*  CALCIUM 8.2* 8.4*   PT/INR No results for input(s): LABPROT, INR in the last 72 hours. ABG No results for input(s): PHART, HCO3 in the last 72 hours.  Invalid input(s): PCO2, PO2  Studies/Results: Dg Chest Port 1 View  Result Date: 05/27/2016 CLINICAL DATA:  Recent fall with left-sided chest pain EXAM: PORTABLE CHEST 1 VIEW COMPARISON:  05/26/2016 FINDINGS: Cardiac shadow is stable. Aortic calcifications are again seen. Left-sided pleural effusion and underlying atelectasis is again identified the previously seen rib fractures on the left are not well appreciated on this exam. No definitive pneumothorax is seen. Patchy infiltrate is again seen in the right upper lobe and stable. IMPRESSION: Patchy bilateral infiltrates slightly greater on the left than the right. Associated pleural effusion on the left is noted. Electronically Signed   By: Inez Catalina M.D.   On: 05/27/2016 07:21   Dg  Chest Port 1 View  Result Date: 05/26/2016 CLINICAL DATA:  Patient had fall on 3/23 and fractured left ribs, also having new consolidation on right. Still having left side chest pain today. EXAM: PORTABLE CHEST 1 VIEW COMPARISON:  05/25/2016 and back to 05/23/2016. FINDINGS: Numerous leads and wires project over the chest. Right hemidiaphragm elevation. Chin overlies the apices. Cardiomegaly accentuated by AP portable technique. Atherosclerosis in the transverse aorta. No pleural effusion or pneumothorax. No significant change in right upper and left lower lobe airspace disease. Mild interstitial prominence. IMPRESSION: No significant change in multifocal airspace disease, suspicious for infection. Cardiomegaly with mild pulmonary venous congestion. Electronically Signed   By: Abigail Miyamoto M.D.   On: 05/26/2016 08:52    Anti-infectives: Anti-infectives    None      Assessment/Plan: Fall Left posterior 6th and 7th rib fractures with subtle left basilar PTX Small left pleural effusion and contusion - CXR today with stable small effusion, some patchy infiltrates - pain control: Tylenol 650 mg q 6h, Robaxin 500 mg q 6 PRN, oxycodone 5-10 mg q 6h PRN, traMADol 50 mg q 6h  DM type II - SSI, Levemir 10-20 U daily at bedtime  COPD HTN - norvasc OSA - CPAP at night Obesity PVC's S/P gastric banding Hypokalemia - replace PO KCl Hyponatremia - normal saline @ 50 cc/hr AKI - improved VTE: lovenox  Plan: aggressive pulmonary toilet and pain control; PT eval. She lives alone.   LOS: 3 days    [name],  MD, MPH, FACS Trauma: [phone] General Surgery: [phone]  3/27/2018Patient ID: Kathleen [name], female   DOB: 10-29-1950, 66 y.o.   MRN: [id number]

## 2016-05-27 NOTE — Progress Notes (Signed)
Patient refuses CPAP 

## 2016-05-27 NOTE — Progress Notes (Signed)
SATURATION QUALIFICATIONS: (This note is used to comply with regulatory documentation for home oxygen)  Patient Saturations on 4L = 87-93%  Patient Saturations on 4 Liters of oxygen after Ambulating = 86 %  Please briefly explain why patient needs home oxygen: According to the RN the patient saturation will be 86-93% due to illness but patient tolerate well  And at rest the patient saturation is 93% on 4 [place] [address]

## 2016-05-27 NOTE — Progress Notes (Addendum)
Patient ambulated to bathroom without oxygen. Sp02 91%. Patient placed back on nasal cannula 4L and Sp02 came up to 90%.

## 2016-05-28 ENCOUNTER — Encounter: Payer: Self-pay | Admitting: Interventional Cardiology

## 2016-05-28 LAB — GLUCOSE, CAPILLARY
GLUCOSE-CAPILLARY: 44 mg/dL — AB (ref 65–99)
GLUCOSE-CAPILLARY: 105 mg/dL — AB (ref 65–99)
Glucose-Capillary: 139 mg/dL — ABNORMAL HIGH (ref 65–99)
Glucose-Capillary: 54 mg/dL — ABNORMAL LOW (ref 65–99)
Glucose-Capillary: 57 mg/dL — ABNORMAL LOW (ref 65–99)

## 2016-05-28 MED ORDER — TRAMADOL HCL 50 MG PO TABS: 50.0000 mg | ORAL_TABLET | Freq: Four times a day (QID) | ORAL | 0 refills | Status: DC | PRN

## 2016-05-28 MED ORDER — METHOCARBAMOL 500 MG PO TABS: 500.0000 mg | ORAL_TABLET | Freq: Four times a day (QID) | ORAL | 0 refills | Status: DC | PRN

## 2016-05-28 MED ORDER — OXYCODONE HCL 5 MG PO TABS: 5.0000 mg | ORAL_TABLET | ORAL | 0 refills | Status: DC | PRN

## 2016-05-28 NOTE — Progress Notes (Signed)
Discharge instructions reviewed with pt and prescriptions given.  Pt verbalized understanding and questions answered.  Pt discharged in stable condition via wheelchair with home O2 with friend.  Eliezer [place]

## 2016-05-28 NOTE — Care Management Note (Signed)
Case Management Note  Patient Details  Name: Kathleen [name] MRN: [id number] Date of Birth: December 18, 1950  Subjective/Objective: Pt medically stable for discharge home today.  Pt states she will have intermittent supervision from friends/neighbors at home.  She states she has all needed DME at home.                     Action/Plan: Pt will require home oxygen, as desats on room air.  Left message with London Sheer, patient care advocate for [facility] to help coordinate follow up of home O2 with PCP.  Trauma MD will not do O2 follow up, and pt will need to follow up with PCP.  [name] states she will get a priority message to PCP and call back.  Referral to [facility] for home oxygen; [facility] aware that pt will follow up with PCP.    Expected Discharge Date:  05/28/16               Expected Discharge Plan:  Home/Self Care  In-House Referral:     Discharge planning Services  CM Consult  Post Acute Care Choice:  Durable Medical Equipment Choice offered to:     DME Arranged:  Oxygen DME Agency:  [place]:    [facility] Agency:     Status of Service:  Completed, signed off  If discussed at [place] of Stay Meetings, dates discussed:    Additional Comments:  Reinaldo Raddle, RN, BSN  Trauma/Neuro ICU Case Manager [phone]

## 2016-05-28 NOTE — Evaluation (Signed)
Occupational Therapy Evaluation Patient Details Name: Kathleen [name] MRN: [id number] DOB: 02-14-1951 Today's Date: 05/28/2016    History of Present Illness 66 yo female, post fall on 05/23/16 with Left posterior rib fractures of 6 & 7 with pneumothorax.  Prior Hx includes: heart disease, pulmonary HTN, obesity, OSA with CPAP, DM, HLD.   Clinical Impression   PTA, pt lived alone and was independent in ADLs, IADLs, driving, and taking care of her cats. Currently, pt is Mon independent for ADLs and functional mobility with need for increased time to perform occupations due to increased pain with movement. Provided education on safe shower techniques and UB dressing to decrease pain when moving LUE.  Provided education on fall prevention, shower safety, and energy conservation to increase pt safety at home. Answered all pt's questions and provided necessary education to facilitate safe dc home. Recommend dc home once medically stable per physician.     Follow Up Recommendations  No OT follow up    Equipment Recommendations  None recommended by OT    Recommendations for Other Services       Precautions / Restrictions Precautions Precautions: Fall (O2 saturation.) Restrictions Weight Bearing Restrictions: No      Mobility Bed Mobility Overal bed mobility: Modified Independent             General bed mobility comments: Has adjustable bed at home.  Transfers Overall transfer level: Modified independent Equipment used: Rolling walker (2 wheeled)                  Balance Overall balance assessment: Modified Independent                                         ADL either performed or assessed with clinical judgement   ADL Overall ADL's : Modified independent                                       General ADL Comments: Pt performed toileting, groomign at sink, and dressing at Mod I level with increased time due to pain with movement      Vision         Perception     Praxis      Pertinent Vitals/Pain Pain Assessment: 0-10 Pain Score: 6  Pain Location: Left side trunk Pain Descriptors / Indicators: Aching;Sore Pain Intervention(s): Monitored during session     Hand Dominance     Extremity/Trunk Assessment Upper Extremity Assessment Upper Extremity Assessment: Overall WFL for tasks assessed LUE Deficits / Details: Pain with movement and reaching/pushing with Left arm.   Lower Extremity Assessment Lower Extremity Assessment: Overall WFL for tasks assessed   Cervical / Trunk Assessment Cervical / Trunk Assessment: Normal   Communication Communication Communication: No difficulties   Cognition Arousal/Alertness: Awake/alert Behavior During Therapy: WFL for tasks assessed/performed Overall Cognitive Status: Within Functional Limits for tasks assessed                                     General Comments  Pt dropped to 63 on room air at EOB. Replaced [place] and waited till pt returned to 90s before performing activity    Exercises     Shoulder Instructions  Home Living Family/patient expects to be discharged to:: Private residence Living Arrangements: Alone Available Help at Discharge: Available PRN/intermittently;Friend(s) (Main assistant is in hospital as well right now.) Type of Home: Other(Comment) (Condo) Home Access: Level entry     Home Layout: One level     Bathroom Shower/Tub: Occupational psychologist: Handicapped height Bathroom Accessibility: Yes How Accessible: Accessible via walker Home Equipment: [place] - 2 wheels;Walker - 4 wheels;Grab bars - toilet;Cane - single point;Grab bars - tub/shower (Adjustable bed)          Prior Functioning/Environment Level of Independence: Independent (uses devices as needed.)                 OT Problem List: Decreased range of motion;Decreased activity tolerance;Impaired balance (sitting and/or  standing);Decreased knowledge of use of DME or AE;Pain      OT Treatment/Interventions:      OT Goals(Current goals can be found in the care plan section) Acute Rehab OT Goals Patient Stated Goal: Breathe better OT Goal Formulation: With patient Time For Goal Achievement: 06/11/16 Potential to Achieve Goals: Good  OT Frequency:     Barriers to D/C:            Co-evaluation              End of Session Equipment Utilized During Treatment: Rolling walker;Oxygen (4L) Nurse Communication: Mobility status  Activity Tolerance: Patient tolerated treatment well Patient left: in bed;with call bell/phone within reach  OT Visit Diagnosis: Unsteadiness on feet (R26.81);History of falling (Z91.81);Pain Pain - Right/Left: Left Pain - part of body:  (Ribs)                Time: 8850-2774 OT Time Calculation (min): 31 min Charges:  OT General Charges $OT Visit: 1 Procedure OT Evaluation $OT Eval Low Complexity: 1 Procedure OT Treatments $Self Care/Home Management : 8-22 mins G-Codes:     [employer], OTR/L [place] 05/28/2016, 9:03 AM

## 2016-05-28 NOTE — Discharge Summary (Signed)
Physician Discharge Summary  Patient ID: Kathleen [name] MRN: [id number] DOB/AGE: 1951/02/07 66 y.o.  Admit date: 05/23/2016 Discharge date: 05/28/2016  Admission Diagnoses:L rib FX X 2  Discharge Diagnoses: L rib FX X 2 Active Problems:   Pneumothorax on left   Left rib fracture   Discharged Condition: good  Hospital Course: Admitted for pain control and pulmonary toilet. She progressed well, did well with therapies, and had stable F/U CXR. Pain controlled. I emphasized the need to use home CPAP as usual. D/C home.  Consults: None  Significant Diagnostic Studies: CXR  Treatments: pain control  Discharge Exam: Blood pressure 98/79, pulse (!) 118, temperature 98.4 F (36.9 C), temperature source Oral, resp. rate 18, height 5\' 3"  (1.6 m), weight 91 kg (200 lb 9.6 oz), SpO2 94 %. General appearance: cooperative Resp: clear to auscultation bilaterally Cardio: regular rate and rhythm GI: soft, band port  Disposition: 01-Home or Self Care  Discharge Instructions    Call MD for:  severe uncontrolled pain    Complete by:  As directed    Diet - low sodium heart healthy    Complete by:  As directed    Discharge instructions    Complete by:  As directed    Use CPAP as usual.   Increase activity slowly    Complete by:  As directed      Allergies as of 05/28/2016      Reactions   Ace Inhibitors Swelling   Mouth area   Benicar [olmesartan] Other (See Comments)   Angioedema   Epinephrine Hcl    Hives   Levaquin [levofloxacin In D5w]    angioedema      Medication List    TAKE these medications   ALPHAGAN P 0.15 % ophthalmic solution Generic drug:  brimonidine Place 1 drop into both eyes 2 (two) times daily.   amLODipine 10 MG tablet Commonly known as:  NORVASC Take 10 mg by mouth daily.   aspirin 81 MG tablet Take 81 mg by mouth daily.   atorvastatin 40 MG tablet Commonly known as:  LIPITOR Take 40 mg by mouth daily.   carboxymethylcellulose 0.5 %  Soln Commonly known as:  REFRESH PLUS Place 1 drop into both eyes 3 (three) times daily as needed (dry eyes).   chlorthalidone 25 MG tablet Commonly known as:  HYGROTON Take 25 mg by mouth daily.   latanoprost 0.005 % ophthalmic solution Commonly known as:  XALATAN Place 1 drop into both eyes at bedtime.   LEVEMIR FLEXPEN 100 UNIT/ML Pen Generic drug:  Insulin Detemir Inject 10-20 Units into the skin daily at 10 pm. If blood sugar > 100 take 20 units, but takes less if lower than 100   metFORMIN 850 MG tablet Commonly known as:  GLUCOPHAGE Take 850 mg by mouth 2 (two) times daily with a meal.   methimazole 10 MG tablet Commonly known as:  TAPAZOLE Take 5 mg by mouth daily. Take on Tuesday and Saturday   methocarbamol 500 MG tablet Commonly known as:  ROBAXIN Take 1 tablet (500 mg total) by mouth every 6 (six) hours as needed for muscle spasms.   multivitamin capsule Take 1 capsule by mouth daily.   NOVOLOG FLEXPEN 100 UNIT/ML injection Generic drug:  insulin aspart Inject 0-18 Units into the skin 3 (three) times daily with meals. Based on sliding scale   ONE TOUCH ULTRA TEST test strip Generic drug:  glucose blood   oxyCODONE 5 MG immediate release tablet Commonly known as:  Oxy IR/ROXICODONE Take 1-2 tablets (5-10 mg total) by mouth every 4 (four) hours as needed for moderate pain.   predniSONE 10 MG tablet Commonly known as:  DELTASONE Take 2 tablets (20 mg total) by mouth daily.   spironolactone 25 MG tablet Commonly known as:  ALDACTONE Take 6.25 mg by mouth daily.   timolol 0.5 % ophthalmic solution Commonly known as:  TIMOPTIC Place 1 drop into both eyes daily.   traMADol 50 MG tablet Commonly known as:  ULTRAM Take 1 tablet (50 mg total) by mouth every 6 (six) hours as needed for moderate pain.      Follow-up Information    [facility] Follow up.   Why:  Call if needed Contact information: [place] [id number] [place], [place]. Schedule an appointment as soon as possible for a visit.   Specialty:  Family Medicine Contact information: [place] [address] [phone]           Signed: Zenovia Jarred 05/28/2016, 7:32 AM

## 2016-05-28 NOTE — Progress Notes (Signed)
SATURATION QUALIFICATIONS: (This note is used to comply with regulatory documentation for home oxygen)  Patient Saturations on Room Air at Rest = 82%   Manus Rudd, Ralene Muskrat

## 2016-05-28 NOTE — Care Management Important Message (Signed)
Important Message  Patient Details  Name: Kathleen [name] MRN: [id number] Date of Birth: 17-Oct-1950   Medicare Important Message Given:  Yes    Orbie Pyo 05/28/2016, 11:22 AM

## 2016-06-05 DIAGNOSIS — S2239XA Fracture of one rib, unspecified side, initial encounter for closed fracture: Secondary | ICD-10-CM | POA: Diagnosis not present

## 2016-06-05 DIAGNOSIS — Z794 Long term (current) use of insulin: Secondary | ICD-10-CM | POA: Diagnosis not present

## 2016-06-05 DIAGNOSIS — E1129 Type 2 diabetes mellitus with other diabetic kidney complication: Secondary | ICD-10-CM | POA: Diagnosis not present

## 2016-06-05 DIAGNOSIS — Z7984 Long term (current) use of oral hypoglycemic drugs: Secondary | ICD-10-CM | POA: Diagnosis not present

## 2016-06-05 DIAGNOSIS — Z8709 Personal history of other diseases of the respiratory system: Secondary | ICD-10-CM | POA: Diagnosis not present

## 2016-06-05 DIAGNOSIS — R0902 Hypoxemia: Secondary | ICD-10-CM | POA: Diagnosis not present

## 2016-06-10 ENCOUNTER — Ambulatory Visit: Payer: Medicare Other | Admitting: Interventional Cardiology

## 2016-06-22 NOTE — Progress Notes (Signed)
Cardiology Office Note   Date:  06/23/2016   ID:  Kathleen, [name] January 30, 1951, MRN [id number]  PCP:  Kathleen Heck, MD    No chief complaint on file. f/u abnormal stress test, dizziness   Wt Readings from Last 3 Encounters:  06/23/16 188 lb (85.3 kg)  05/23/16 200 lb 9.6 oz (91 kg)  11/28/15 208 lb 1.9 oz (94.4 kg)       History of Present Illness: Kathleen [name] is a 66 y.o. female  who has had lap band surgery. She required a repeat operation.  She had an abnormal stress test but was managed medically- reversible basal to mid anterior defect with EF 69%. SHe had been feeling well.    In March 2018, she had a fall with brken ribs and a PTX. Prior to the fall: Denies : Chest pain. Leg edema. Orthopnea. Paroxysmal nocturnal dyspnea. Palpitations. Shortness of breath. Dyspnea. Syncope.  She was not been exercising before her fall due to "laziness."   She uses her CPAP regularly without problems.  Since her weight loss surgery, she gained a lot of weight back and then lost nearly 100 lbs.  Overall, she has lost about 120 lbs. Shs has lost 12 lbs in the past 4 weeks.   She has had some intermittent low BPs with associated dizziness.  Olmesartan was stopped due to angioedema. Typical BP is in the 409-811 range systolic.  Mostly readings are in the 140 range.  When she feels dizzy, it can be 90/45 and she is fatigued over the course of the day.  Spironolactone was added to help raise potassium.  She has Lasix at home for prn use.  She has not noted any connection between Lasix and dizzy spells.  She has also had issues with blood sugar as she has lost weight. Last dizzy spell was over a month ago.     Past Medical History:  Diagnosis Date  . Carotid artery plaque    mild to moderate less tan 50% stenosis on u/s 2/14  . Complication of anesthesia    pt states stopped breathing when having tonsillectomy at age 71; pt states has had anesthesia since then  without difficulty   . Diabetes mellitus   . Glaucoma   . Heart disease   . Hyperlipidemia   . Hypertension   . Menopause   . Mixed hyperlipidemia 09/22/2013  . Morbid obesity ([place])   . Multinodular goiter    AND HYPERTHYROIDISM   . Obesity (BMI 30-39.9) 04/30/2015  . OSA on CPAP   . Proliferative retinopathy    moderate, Dr. Posey Pronto  . Proteinuria    Dr. Hassell Done  . Pulmonary hypertension ([place])   . Right-sided heart failure ([place])   . Thyroid disease    pt currently on no medications   . Trigger finger    Dr. Rip Harbour    Past Surgical History:  Procedure Laterality Date  . GASTRIC BANDING PORT REVISION N/A 02/27/2015   Procedure: GASTRIC BANDING PORT REVISION;  Surgeon: Johnathan Hausen, MD;  Location: [facility];  Service: General;  Laterality: N/A;  . LAPAROSCOPIC GASTRIC BANDING  2010  . NERVE SURGERY  2001  . SPLIT NIGHT STUDY  08/03/2015  . TONSILLECTOMY  1958     Current Outpatient Prescriptions  Medication Sig Dispense Refill  . ALPHAGAN P 0.15 % ophthalmic solution Place 1 drop into both eyes 2 (two) times daily.     Marland Kitchen amLODipine (NORVASC) 10 MG tablet Take 10  mg by mouth daily.    Marland Kitchen aspirin 81 MG tablet Take 81 mg by mouth daily.      Marland Kitchen atorvastatin (LIPITOR) 40 MG tablet Take 40 mg by mouth daily.      . carboxymethylcellulose (REFRESH PLUS) 0.5 % SOLN Place 1 drop into both eyes 3 (three) times daily as needed (dry eyes).    . chlorthalidone (HYGROTON) 25 MG tablet Take 25 mg by mouth daily.    Marland Kitchen ibuprofen (ADVIL,MOTRIN) 800 MG tablet Take 800 mg by mouth every 6 (six) hours as needed.     . latanoprost (XALATAN) 0.005 % ophthalmic solution Place 1 drop into both eyes at bedtime.     Marland Kitchen LEVEMIR FLEXPEN 100 UNIT/ML SOPN Inject 10-20 Units into the skin daily at 10 pm. If blood sugar > 100 take 20 units, but takes less if lower than 100    . metFORMIN (GLUCOPHAGE) 850 MG tablet Take 850 mg by mouth 2 (two) times daily with a meal.      . methimazole (TAPAZOLE) 10 MG tablet Take  5 mg by mouth daily. Take on Tuesday and Saturday    . Multiple Vitamin (MULTIVITAMIN) capsule Take 1 capsule by mouth daily.      Marland Kitchen NOVOLOG FLEXPEN 100 UNIT/ML injection Inject 0-18 Units into the skin 3 (three) times daily with meals. Based on sliding scale    . ONE TOUCH ULTRA TEST test strip     . spironolactone (ALDACTONE) 25 MG tablet Take 6.25 mg by mouth daily.    . timolol (TIMOPTIC) 0.5 % ophthalmic solution Place 1 drop into both eyes daily.      No current facility-administered medications for this visit.     Allergies:   Ace inhibitors; Benicar [olmesartan]; Epinephrine hcl; and Levaquin [levofloxacin in d5w]    Social History:  The patient  reports that she quit smoking about 9 years ago. Her smoking use included Cigarettes. She has a 30.00 pack-year smoking history. She has never used smokeless tobacco. She reports that she drinks alcohol. She reports that she does not use drugs.   Family History:  The patient's family history includes Heart attack in her father. Mother had a stroke during a carotid angio.   ROS:  Please see the history of present illness.   Otherwise, review of systems are positive for dizzy spells.   All other systems are reviewed and negative.    PHYSICAL EXAM: VS:  BP (!) 142/68   Pulse 73   Ht 5\' 3"  (1.6 m)   Wt 188 lb (85.3 kg)   SpO2 92%   BMI 33.30 kg/m  , BMI Body mass index is 33.3 kg/m. GEN: Well nourished, well developed, in no acute distress  HEENT: normal  Neck: no JVD, carotid bruits, or masses Cardiac: RRR; no murmurs, rubs, or gallops,no edema  Respiratory:  clear to auscultation bilaterally, normal work of breathing; normal air exchange on the left side GI: soft, nontender, nondistended, + BS MS: no deformity or atrophy  Skin: warm and dry, no rash Neuro:  Strength and sensation are intact Psych: euthymic mood, full affect   EKG:   The ekg ordered today demonstrates NSR, PACs, no ST changes   Recent Labs: 05/27/2016: BUN  31; Creatinine, Ser 1.67; Hemoglobin 9.7; Platelets 364; Potassium 3.3; Sodium 126   Lipid Panel    Component Value Date/Time   CHOL  02/11/2007 0745    138        ATP III CLASSIFICATION:  <200  mg/dL   Desirable  200-239  mg/dL   Borderline High  >=240    mg/dL   High   TRIG 60 02/11/2007 0745   HDL 44 02/11/2007 0745   CHOLHDL 3.1 02/11/2007 0745   VLDL 12 02/11/2007 0745   [place]  02/11/2007 0745    82        Total Cholesterol/HDL:CHD Risk Coronary Heart Disease Risk Table                     Men   Women  1/2 Average Risk   3.4   3.3     Other studies Reviewed: Additional studies/ records that were reviewed today with results demonstrating: 2008 stress test noted..   ASSESSMENT AND PLAN:  1. Hypertensive heart disease/dizziness: No recent spells.  FOr now, would continue current meds.  Blood sugar has stabilized more recently and dizzy spells have improved. Insulin dose drastically reduced.  If dizzy spells return, would decrease chlorthalidone to 12.5 mg.  COntinue low dose spironolactone and amlodipine.   2. CRI:  Stable.  1.67 when in the hospital. 3. Obesity: Improving. 4. Bradycardia:  Not on any rate slowing drug.  Lightheadedness has corresponded to blood sugars in the 50s.  More likely related the blood sugar, rather than the heart rate.  PACs noted on ECG. 5. Abnormal stress test: in 2008.  No chest pain.  No need for further ischemia testing at this point.   Current medicines are reviewed at length with the patient today.  The patient concerns regarding her medicines were addressed.  The following changes have been made:  No change  Labs/ tests ordered today include:  No orders of the defined types were placed in this encounter.   Recommend 150 minutes/week of aerobic exercise Low fat, low carb, high fiber diet recommended  Disposition:   FU in 1 year   Signed, Larae Grooms, MD  06/23/2016 8:52 AM    [facility] [place], [place], [place]  [address] Phone: [phone]; Fax: [phone]

## 2016-06-23 ENCOUNTER — Encounter: Payer: Self-pay | Admitting: Interventional Cardiology

## 2016-06-23 ENCOUNTER — Ambulatory Visit (INDEPENDENT_AMBULATORY_CARE_PROVIDER_SITE_OTHER): Payer: Medicare Other | Admitting: Interventional Cardiology

## 2016-06-23 VITALS — BP 142/68 | HR 73 | Ht 63.0 in | Wt 188.0 lb

## 2016-06-23 DIAGNOSIS — N183 Chronic kidney disease, stage 3 unspecified: Secondary | ICD-10-CM

## 2016-06-23 DIAGNOSIS — J939 Pneumothorax, unspecified: Secondary | ICD-10-CM

## 2016-06-23 DIAGNOSIS — I119 Hypertensive heart disease without heart failure: Secondary | ICD-10-CM | POA: Insufficient documentation

## 2016-06-23 DIAGNOSIS — R9439 Abnormal result of other cardiovascular function study: Secondary | ICD-10-CM | POA: Diagnosis not present

## 2016-06-23 DIAGNOSIS — R42 Dizziness and giddiness: Secondary | ICD-10-CM

## 2016-06-23 NOTE — Patient Instructions (Addendum)
Your physician recommends that you continue on your current medications as directed. Please refer to the Current Medication list given to you today.  Your physician wants you to follow-up in: YEAR WITH DR VARANASI  You will receive a reminder letter in the mail two months in advance. If you don't receive a letter, please call our office to schedule the follow-up appointment. 

## 2016-06-26 DIAGNOSIS — H401131 Primary open-angle glaucoma, bilateral, mild stage: Secondary | ICD-10-CM | POA: Diagnosis not present

## 2016-06-26 DIAGNOSIS — H04123 Dry eye syndrome of bilateral lacrimal glands: Secondary | ICD-10-CM | POA: Diagnosis not present

## 2016-07-09 DIAGNOSIS — E113311 Type 2 diabetes mellitus with moderate nonproliferative diabetic retinopathy with macular edema, right eye: Secondary | ICD-10-CM | POA: Diagnosis not present

## 2016-07-10 DIAGNOSIS — Z780 Asymptomatic menopausal state: Secondary | ICD-10-CM | POA: Diagnosis not present

## 2016-07-10 DIAGNOSIS — Z1231 Encounter for screening mammogram for malignant neoplasm of breast: Secondary | ICD-10-CM | POA: Diagnosis not present

## 2016-08-18 DIAGNOSIS — D225 Melanocytic nevi of trunk: Secondary | ICD-10-CM | POA: Diagnosis not present

## 2016-08-18 DIAGNOSIS — L720 Epidermal cyst: Secondary | ICD-10-CM | POA: Diagnosis not present

## 2016-08-18 DIAGNOSIS — L820 Inflamed seborrheic keratosis: Secondary | ICD-10-CM | POA: Diagnosis not present

## 2016-08-20 DIAGNOSIS — Z4651 Encounter for fitting and adjustment of gastric lap band: Secondary | ICD-10-CM | POA: Diagnosis not present

## 2016-10-08 DIAGNOSIS — Z4651 Encounter for fitting and adjustment of gastric lap band: Secondary | ICD-10-CM | POA: Diagnosis not present

## 2016-10-14 DIAGNOSIS — H35421 Microcystoid degeneration of retina, right eye: Secondary | ICD-10-CM | POA: Diagnosis not present

## 2016-10-14 DIAGNOSIS — E1165 Type 2 diabetes mellitus with hyperglycemia: Secondary | ICD-10-CM | POA: Diagnosis not present

## 2016-10-14 DIAGNOSIS — H348312 Tributary (branch) retinal vein occlusion, right eye, stable: Secondary | ICD-10-CM | POA: Diagnosis not present

## 2016-10-14 DIAGNOSIS — H3582 Retinal ischemia: Secondary | ICD-10-CM | POA: Diagnosis not present

## 2016-10-14 DIAGNOSIS — H43813 Vitreous degeneration, bilateral: Secondary | ICD-10-CM | POA: Diagnosis not present

## 2016-10-21 DIAGNOSIS — E0520 Thyrotoxicosis with toxic multinodular goiter without thyrotoxic crisis or storm: Secondary | ICD-10-CM | POA: Diagnosis not present

## 2016-10-21 DIAGNOSIS — E1165 Type 2 diabetes mellitus with hyperglycemia: Secondary | ICD-10-CM | POA: Diagnosis not present

## 2016-10-28 ENCOUNTER — Other Ambulatory Visit (HOSPITAL_COMMUNITY)
Admission: RE | Admit: 2016-10-28 | Discharge: 2016-10-28 | Disposition: A | Discharge status: Home / Self Care | Payer: Medicare Other | Source: Ambulatory Visit | Attending: Nurse Practitioner | Admitting: Nurse Practitioner

## 2016-10-28 ENCOUNTER — Other Ambulatory Visit: Payer: Self-pay | Admitting: Nurse Practitioner

## 2016-10-28 DIAGNOSIS — N6001 Solitary cyst of right breast: Secondary | ICD-10-CM | POA: Diagnosis not present

## 2016-10-28 DIAGNOSIS — Z01419 Encounter for gynecological examination (general) (routine) without abnormal findings: Secondary | ICD-10-CM | POA: Diagnosis not present

## 2016-10-28 DIAGNOSIS — E1165 Type 2 diabetes mellitus with hyperglycemia: Secondary | ICD-10-CM | POA: Diagnosis not present

## 2016-10-28 DIAGNOSIS — Z9189 Other specified personal risk factors, not elsewhere classified: Secondary | ICD-10-CM | POA: Diagnosis not present

## 2016-10-29 DIAGNOSIS — E113392 Type 2 diabetes mellitus with moderate nonproliferative diabetic retinopathy without macular edema, left eye: Secondary | ICD-10-CM | POA: Diagnosis not present

## 2016-10-29 DIAGNOSIS — H401131 Primary open-angle glaucoma, bilateral, mild stage: Secondary | ICD-10-CM | POA: Diagnosis not present

## 2016-10-29 DIAGNOSIS — E113311 Type 2 diabetes mellitus with moderate nonproliferative diabetic retinopathy with macular edema, right eye: Secondary | ICD-10-CM | POA: Diagnosis not present

## 2016-10-29 DIAGNOSIS — H04123 Dry eye syndrome of bilateral lacrimal glands: Secondary | ICD-10-CM | POA: Diagnosis not present

## 2016-10-30 DIAGNOSIS — B9689 Other specified bacterial agents as the cause of diseases classified elsewhere: Secondary | ICD-10-CM | POA: Diagnosis not present

## 2016-10-30 DIAGNOSIS — L02211 Cutaneous abscess of abdominal wall: Secondary | ICD-10-CM | POA: Diagnosis not present

## 2016-10-31 LAB — CYTOLOGY - PAP
Diagnosis: NEGATIVE
HPV: NOT DETECTED

## 2016-11-05 DIAGNOSIS — E113313 Type 2 diabetes mellitus with moderate nonproliferative diabetic retinopathy with macular edema, bilateral: Secondary | ICD-10-CM | POA: Diagnosis not present

## 2016-11-14 ENCOUNTER — Encounter: Payer: Self-pay | Admitting: Cardiology

## 2016-11-27 ENCOUNTER — Ambulatory Visit (INDEPENDENT_AMBULATORY_CARE_PROVIDER_SITE_OTHER): Payer: Medicare Other | Admitting: Cardiology

## 2016-11-27 ENCOUNTER — Other Ambulatory Visit: Payer: Self-pay

## 2016-11-27 ENCOUNTER — Encounter: Payer: Self-pay | Admitting: Cardiology

## 2016-11-27 VITALS — BP 126/80 | HR 74 | Ht 63.0 in | Wt 191.2 lb

## 2016-11-27 DIAGNOSIS — G4733 Obstructive sleep apnea (adult) (pediatric): Secondary | ICD-10-CM

## 2016-11-27 DIAGNOSIS — E669 Obesity, unspecified: Secondary | ICD-10-CM | POA: Diagnosis not present

## 2016-11-27 DIAGNOSIS — I10 Essential (primary) hypertension: Secondary | ICD-10-CM

## 2016-11-27 NOTE — Progress Notes (Signed)
Cardiology Office Note:    Date:  11/27/2016   ID:  Kathleen [name], DOB [birth year], MRN [id number]  PCP:  Leighton Ruff, MD  Cardiologist:  Fransico Him, MD   Referring MD: Leighton Ruff, MD   Chief Complaint  Patient presents with  . Sleep Apnea  . Hypertension    History of Present Illness:    Kathleen [name] is a 66 y.o. female with a hx of OSA on CPAP and HTN.  She is doing well with her CPAP device and thinks that she has gotten used to it.  She tolerates the nasal pillow mask and feels the pressure is adequate.  Since going on CPAP she feels rested in the am and has no significant daytime sleepiness but occasionally will nap.  She denies any significant mouth but occasionally will have some nasal dryness or nasal congestion.  She does not think that she snores.     Past Medical History:  Diagnosis Date  . Carotid artery plaque    mild to moderate less tan 50% stenosis on u/s 2/14  . Complication of anesthesia    pt states stopped breathing when having tonsillectomy at age 61; pt states has had anesthesia since then without difficulty   . Diabetes mellitus   . Glaucoma   . Heart disease   . Hyperlipidemia   . Hypertension   . Menopause   . Mixed hyperlipidemia 09/22/2013  . Morbid obesity ([place])   . Multinodular goiter    AND HYPERTHYROIDISM   . Obesity (BMI 30-39.9) 04/30/2015  . OSA on CPAP   . Proliferative retinopathy    moderate, Dr. Posey Pronto  . Proteinuria    Dr. Hassell Done  . Pulmonary hypertension ([place])   . Right-sided heart failure ([place])   . Thyroid disease    pt currently on no medications   . Trigger finger    Dr. Rip Harbour    Past Surgical History:  Procedure Laterality Date  . GASTRIC BANDING PORT REVISION N/A 02/27/2015   Procedure: GASTRIC BANDING PORT REVISION;  Surgeon: Johnathan Hausen, MD;  Location: [facility];  Service: General;  Laterality: N/A;  . LAPAROSCOPIC GASTRIC BANDING  2010  . NERVE SURGERY  2001  . SPLIT NIGHT STUDY  08/03/2015  .  TONSILLECTOMY  1958    Current Medications: Current Meds  Medication Sig  . ALPHAGAN P 0.15 % ophthalmic solution Place 1 drop into both eyes 2 (two) times daily.   Marland Kitchen amLODipine (NORVASC) 10 MG tablet Take 10 mg by mouth daily.  . Ascorbic Acid (VITAMIN C) 1000 MG tablet Take 1,000 mg by mouth daily.  Marland Kitchen aspirin 81 MG tablet Take 81 mg by mouth daily.    Marland Kitchen atorvastatin (LIPITOR) 40 MG tablet Take 40 mg by mouth daily.    Marland Kitchen CALCIUM PO Take 500 mg by mouth 2 (two) times daily.  . carboxymethylcellulose (REFRESH PLUS) 0.5 % SOLN Place 1 drop into both eyes 3 (three) times daily as needed (dry eyes).  . chlorthalidone (HYGROTON) 25 MG tablet Take 25 mg by mouth daily.  Marland Kitchen ibuprofen (ADVIL,MOTRIN) 800 MG tablet Take 800 mg by mouth every 6 (six) hours as needed.   . latanoprost (XALATAN) 0.005 % ophthalmic solution Place 1 drop into both eyes at bedtime.   Marland Kitchen LEVEMIR FLEXPEN 100 UNIT/ML SOPN Inject 10-20 Units into the skin daily at 10 pm. If blood sugar > 100 take 20 units, but takes less if lower than 100  . metFORMIN (GLUCOPHAGE) 850 MG tablet  Take 850 mg by mouth 2 (two) times daily with a meal.    . methimazole (TAPAZOLE) 10 MG tablet Take 5 mg by mouth daily.   . Multiple Vitamin (MULTIVITAMIN) capsule Take 1 capsule by mouth daily.    Marland Kitchen NOVOLOG FLEXPEN 100 UNIT/ML injection Inject 0-18 Units into the skin 3 (three) times daily with meals. Based on sliding scale  . ONE TOUCH ULTRA TEST test strip   . spironolactone (ALDACTONE) 25 MG tablet Take 6.25 mg by mouth daily.  . timolol (TIMOPTIC) 0.5 % ophthalmic solution Place 1 drop into both eyes daily.      Allergies:   Ace inhibitors; Benicar [olmesartan]; Epinephrine hcl; and Levaquin [levofloxacin in d5w]   Social History   Social History  . Marital status: Single    Spouse name: N/A  . Number of children: N/A  . Years of education: N/A   Social History Main Topics  . Smoking status: Former Smoker    Packs/day: 1.00    Years:  30.00    Types: Cigarettes    Quit date: 03/04/2007  . Smokeless tobacco: Never Used  . Alcohol use 0.0 oz/week     Comment: occas  . Drug use: No  . Sexual activity: Not Asked   Other Topics Concern  . None   Social History Narrative  . None     Family History: The patient's family history includes Heart attack in her father.  ROS:   Please see the history of present illness.    ROS  All other systems reviewed and negative.   EKGs/Labs/Other Studies Reviewed:    The following studies were reviewed today: CPAP download  EKG:  EKG is not ordered today.    Recent Labs: 05/27/2016: BUN 31; Creatinine, Ser 1.67; Hemoglobin 9.7; Platelets 364; Potassium 3.3; Sodium 126   Recent Lipid Panel    Component Value Date/Time   CHOL  02/11/2007 0745    138        ATP III CLASSIFICATION:  <200     mg/dL   Desirable  200-239  mg/dL   Borderline High  >=240    mg/dL   High   TRIG 60 02/11/2007 0745   HDL 44 02/11/2007 0745   CHOLHDL 3.1 02/11/2007 0745   VLDL 12 02/11/2007 0745   LDLCALC  02/11/2007 0745    82        Total Cholesterol/HDL:CHD Risk Coronary Heart Disease Risk Table                     Men   Women  1/2 Average Risk   3.4   3.3    Physical Exam:    VS:  BP 126/80   Pulse 74   Ht 5\' 3"  (1.6 m)   Wt 191 lb 3.2 oz (86.7 kg)   SpO2 96%   BMI 33.87 kg/m     Wt Readings from Last 3 Encounters:  11/27/16 191 lb 3.2 oz (86.7 kg)  06/23/16 188 lb (85.3 kg)  05/23/16 200 lb 9.6 oz (91 kg)     GEN:  Well nourished, well developed in no acute distress HEENT: Normal NECK: No JVD; No carotid bruits LYMPHATICS: No lymphadenopathy CARDIAC: RRR, no murmurs, rubs, gallops RESPIRATORY:  Clear to auscultation without rales, wheezing or rhonchi  ABDOMEN: Soft, non-tender, non-distended MUSCULOSKELETAL:  No edema; No deformity  SKIN: Warm and dry NEUROLOGIC:  Alert and oriented x 3 PSYCHIATRIC:  Normal affect   ASSESSMENT:  1. OSA (obstructive sleep apnea)    2. Essential hypertension   3. Obesity (BMI 30-39.9)    PLAN:    In order of problems listed above:  1.  OSA - the patient is tolerating PAP therapy well without any problems. The PAP download was reviewed today and showed an AHI of 3.1/hr on 9 cm H2O with 100% compliance in using more than 4 hours nightly.  The patient has been using and benefiting from CPAP use and will continue to benefit from therapy.   2.  HTN - BP well controlled on exam today.  Continue on amlodipine 10mg  daily, chlorthalidone 25mg  daily and aldactone 6.25mg  daily  3.  Obesity - I have encouraged her to get into a routine exercise program and cut back on carbs and portions.     Medication Adjustments/Labs and Tests Ordered: Current medicines are reviewed at length with the patient today.  Concerns regarding medicines are outlined above.  No orders of the defined types were placed in this encounter.  No orders of the defined types were placed in this encounter.   Signed, Fransico Him, MD  11/27/2016 8:39 AM    [place]

## 2016-11-27 NOTE — Patient Instructions (Signed)
Medication Instructions:  The current medical regimen is effective;  continue present plan and medications.  Labwork: None  Testing/Procedures: None  Follow-Up: Follow up in 1 year with Dr. Radford Pax.  You will receive a letter in the mail 2 months before you are due.  Please call us when you receive this letter to schedule your follow up appointment.   If you need a refill on your cardiac medications before your next appointment, please call your pharmacy.  Thank you for choosing [facility]!!

## 2016-12-09 DIAGNOSIS — Z23 Encounter for immunization: Secondary | ICD-10-CM | POA: Diagnosis not present

## 2016-12-19 DIAGNOSIS — Z4651 Encounter for fitting and adjustment of gastric lap band: Secondary | ICD-10-CM | POA: Diagnosis not present

## 2016-12-23 DIAGNOSIS — E0520 Thyrotoxicosis with toxic multinodular goiter without thyrotoxic crisis or storm: Secondary | ICD-10-CM | POA: Diagnosis not present

## 2016-12-23 DIAGNOSIS — E119 Type 2 diabetes mellitus without complications: Secondary | ICD-10-CM | POA: Diagnosis not present

## 2016-12-23 DIAGNOSIS — E1165 Type 2 diabetes mellitus with hyperglycemia: Secondary | ICD-10-CM | POA: Diagnosis not present

## 2016-12-23 DIAGNOSIS — I10 Essential (primary) hypertension: Secondary | ICD-10-CM | POA: Diagnosis not present

## 2017-02-05 DIAGNOSIS — Z6836 Body mass index (BMI) 36.0-36.9, adult: Secondary | ICD-10-CM | POA: Diagnosis not present

## 2017-02-05 DIAGNOSIS — E871 Hypo-osmolality and hyponatremia: Secondary | ICD-10-CM | POA: Diagnosis not present

## 2017-02-05 DIAGNOSIS — E1121 Type 2 diabetes mellitus with diabetic nephropathy: Secondary | ICD-10-CM | POA: Diagnosis not present

## 2017-02-05 DIAGNOSIS — I10 Essential (primary) hypertension: Secondary | ICD-10-CM | POA: Diagnosis not present

## 2017-02-05 DIAGNOSIS — R809 Proteinuria, unspecified: Secondary | ICD-10-CM | POA: Diagnosis not present

## 2017-02-17 DIAGNOSIS — E0520 Thyrotoxicosis with toxic multinodular goiter without thyrotoxic crisis or storm: Secondary | ICD-10-CM | POA: Diagnosis not present

## 2017-02-17 DIAGNOSIS — E1165 Type 2 diabetes mellitus with hyperglycemia: Secondary | ICD-10-CM | POA: Diagnosis not present

## 2017-03-11 DIAGNOSIS — H348312 Tributary (branch) retinal vein occlusion, right eye, stable: Secondary | ICD-10-CM | POA: Diagnosis not present

## 2017-03-11 DIAGNOSIS — H43813 Vitreous degeneration, bilateral: Secondary | ICD-10-CM | POA: Diagnosis not present

## 2017-03-11 DIAGNOSIS — E113392 Type 2 diabetes mellitus with moderate nonproliferative diabetic retinopathy without macular edema, left eye: Secondary | ICD-10-CM | POA: Diagnosis not present

## 2017-03-11 DIAGNOSIS — E113311 Type 2 diabetes mellitus with moderate nonproliferative diabetic retinopathy with macular edema, right eye: Secondary | ICD-10-CM | POA: Diagnosis not present

## 2017-03-17 DIAGNOSIS — R809 Proteinuria, unspecified: Secondary | ICD-10-CM | POA: Diagnosis not present

## 2017-03-17 DIAGNOSIS — I10 Essential (primary) hypertension: Secondary | ICD-10-CM | POA: Diagnosis not present

## 2017-03-30 DIAGNOSIS — E113392 Type 2 diabetes mellitus with moderate nonproliferative diabetic retinopathy without macular edema, left eye: Secondary | ICD-10-CM | POA: Diagnosis not present

## 2017-03-30 DIAGNOSIS — H2513 Age-related nuclear cataract, bilateral: Secondary | ICD-10-CM | POA: Diagnosis not present

## 2017-03-30 DIAGNOSIS — H35033 Hypertensive retinopathy, bilateral: Secondary | ICD-10-CM | POA: Diagnosis not present

## 2017-03-30 DIAGNOSIS — E113311 Type 2 diabetes mellitus with moderate nonproliferative diabetic retinopathy with macular edema, right eye: Secondary | ICD-10-CM | POA: Diagnosis not present

## 2017-03-30 DIAGNOSIS — H04123 Dry eye syndrome of bilateral lacrimal glands: Secondary | ICD-10-CM | POA: Diagnosis not present

## 2017-03-30 DIAGNOSIS — H401131 Primary open-angle glaucoma, bilateral, mild stage: Secondary | ICD-10-CM | POA: Diagnosis not present

## 2017-04-24 DIAGNOSIS — E785 Hyperlipidemia, unspecified: Secondary | ICD-10-CM | POA: Diagnosis not present

## 2017-04-24 DIAGNOSIS — Z794 Long term (current) use of insulin: Secondary | ICD-10-CM | POA: Diagnosis not present

## 2017-04-24 DIAGNOSIS — E1129 Type 2 diabetes mellitus with other diabetic kidney complication: Secondary | ICD-10-CM | POA: Diagnosis not present

## 2017-04-24 DIAGNOSIS — M8588 Other specified disorders of bone density and structure, other site: Secondary | ICD-10-CM | POA: Diagnosis not present

## 2017-04-24 DIAGNOSIS — E559 Vitamin D deficiency, unspecified: Secondary | ICD-10-CM | POA: Diagnosis not present

## 2017-04-24 DIAGNOSIS — E119 Type 2 diabetes mellitus without complications: Secondary | ICD-10-CM | POA: Diagnosis not present

## 2017-04-24 DIAGNOSIS — R809 Proteinuria, unspecified: Secondary | ICD-10-CM | POA: Diagnosis not present

## 2017-04-24 DIAGNOSIS — I10 Essential (primary) hypertension: Secondary | ICD-10-CM | POA: Diagnosis not present

## 2017-05-12 DIAGNOSIS — E878 Other disorders of electrolyte and fluid balance, not elsewhere classified: Secondary | ICD-10-CM | POA: Diagnosis not present

## 2017-05-22 DIAGNOSIS — R1902 Left upper quadrant abdominal swelling, mass and lump: Secondary | ICD-10-CM | POA: Diagnosis not present

## 2017-05-25 ENCOUNTER — Other Ambulatory Visit: Payer: Self-pay | Admitting: Surgery

## 2017-05-25 DIAGNOSIS — R1902 Left upper quadrant abdominal swelling, mass and lump: Secondary | ICD-10-CM

## 2017-05-27 DIAGNOSIS — M5032 Other cervical disc degeneration, mid-cervical region, unspecified level: Secondary | ICD-10-CM | POA: Diagnosis not present

## 2017-05-27 DIAGNOSIS — M9901 Segmental and somatic dysfunction of cervical region: Secondary | ICD-10-CM | POA: Diagnosis not present

## 2017-05-27 DIAGNOSIS — M5134 Other intervertebral disc degeneration, thoracic region: Secondary | ICD-10-CM | POA: Diagnosis not present

## 2017-05-27 DIAGNOSIS — M9902 Segmental and somatic dysfunction of thoracic region: Secondary | ICD-10-CM | POA: Diagnosis not present

## 2017-05-28 DIAGNOSIS — M9901 Segmental and somatic dysfunction of cervical region: Secondary | ICD-10-CM | POA: Diagnosis not present

## 2017-05-28 DIAGNOSIS — M9902 Segmental and somatic dysfunction of thoracic region: Secondary | ICD-10-CM | POA: Diagnosis not present

## 2017-05-28 DIAGNOSIS — M5134 Other intervertebral disc degeneration, thoracic region: Secondary | ICD-10-CM | POA: Diagnosis not present

## 2017-05-28 DIAGNOSIS — M5032 Other cervical disc degeneration, mid-cervical region, unspecified level: Secondary | ICD-10-CM | POA: Diagnosis not present

## 2017-06-02 ENCOUNTER — Ambulatory Visit
Admission: RE | Admit: 2017-06-02 | Discharge: 2017-06-02 | Disposition: A | Discharge status: Home / Self Care | Payer: Medicare Other | Source: Ambulatory Visit | Attending: Surgery | Admitting: Surgery

## 2017-06-02 DIAGNOSIS — M5032 Other cervical disc degeneration, mid-cervical region, unspecified level: Secondary | ICD-10-CM | POA: Diagnosis not present

## 2017-06-02 DIAGNOSIS — M9901 Segmental and somatic dysfunction of cervical region: Secondary | ICD-10-CM | POA: Diagnosis not present

## 2017-06-02 DIAGNOSIS — R1012 Left upper quadrant pain: Secondary | ICD-10-CM | POA: Diagnosis not present

## 2017-06-02 DIAGNOSIS — M5134 Other intervertebral disc degeneration, thoracic region: Secondary | ICD-10-CM | POA: Diagnosis not present

## 2017-06-02 DIAGNOSIS — M9902 Segmental and somatic dysfunction of thoracic region: Secondary | ICD-10-CM | POA: Diagnosis not present

## 2017-06-02 DIAGNOSIS — R1902 Left upper quadrant abdominal swelling, mass and lump: Secondary | ICD-10-CM

## 2017-06-02 MED ORDER — IOPAMIDOL (ISOVUE-300) INJECTION 61%
100.0000 mL | Freq: Once | INTRAVENOUS | Status: AC | PRN
  Administered 2017-06-02: 80 mL via INTRAVENOUS

## 2017-06-04 DIAGNOSIS — M9902 Segmental and somatic dysfunction of thoracic region: Secondary | ICD-10-CM | POA: Diagnosis not present

## 2017-06-04 DIAGNOSIS — M9901 Segmental and somatic dysfunction of cervical region: Secondary | ICD-10-CM | POA: Diagnosis not present

## 2017-06-04 DIAGNOSIS — M5134 Other intervertebral disc degeneration, thoracic region: Secondary | ICD-10-CM | POA: Diagnosis not present

## 2017-06-04 DIAGNOSIS — M5032 Other cervical disc degeneration, mid-cervical region, unspecified level: Secondary | ICD-10-CM | POA: Diagnosis not present

## 2017-06-08 DIAGNOSIS — E0590 Thyrotoxicosis, unspecified without thyrotoxic crisis or storm: Secondary | ICD-10-CM | POA: Diagnosis not present

## 2017-06-08 DIAGNOSIS — E042 Nontoxic multinodular goiter: Secondary | ICD-10-CM | POA: Diagnosis not present

## 2017-06-08 DIAGNOSIS — E08311 Diabetes mellitus due to underlying condition with unspecified diabetic retinopathy with macular edema: Secondary | ICD-10-CM | POA: Diagnosis not present

## 2017-06-08 DIAGNOSIS — E118 Type 2 diabetes mellitus with unspecified complications: Secondary | ICD-10-CM | POA: Diagnosis not present

## 2017-06-09 DIAGNOSIS — M5032 Other cervical disc degeneration, mid-cervical region, unspecified level: Secondary | ICD-10-CM | POA: Diagnosis not present

## 2017-06-09 DIAGNOSIS — M9901 Segmental and somatic dysfunction of cervical region: Secondary | ICD-10-CM | POA: Diagnosis not present

## 2017-06-09 DIAGNOSIS — M9902 Segmental and somatic dysfunction of thoracic region: Secondary | ICD-10-CM | POA: Diagnosis not present

## 2017-06-09 DIAGNOSIS — M5134 Other intervertebral disc degeneration, thoracic region: Secondary | ICD-10-CM | POA: Diagnosis not present

## 2017-06-10 DIAGNOSIS — L723 Sebaceous cyst: Secondary | ICD-10-CM | POA: Diagnosis not present

## 2017-06-10 DIAGNOSIS — Z9884 Bariatric surgery status: Secondary | ICD-10-CM | POA: Diagnosis not present

## 2017-06-11 DIAGNOSIS — M5032 Other cervical disc degeneration, mid-cervical region, unspecified level: Secondary | ICD-10-CM | POA: Diagnosis not present

## 2017-06-11 DIAGNOSIS — M9902 Segmental and somatic dysfunction of thoracic region: Secondary | ICD-10-CM | POA: Diagnosis not present

## 2017-06-11 DIAGNOSIS — M9901 Segmental and somatic dysfunction of cervical region: Secondary | ICD-10-CM | POA: Diagnosis not present

## 2017-06-11 DIAGNOSIS — M5134 Other intervertebral disc degeneration, thoracic region: Secondary | ICD-10-CM | POA: Diagnosis not present

## 2017-06-16 DIAGNOSIS — M9901 Segmental and somatic dysfunction of cervical region: Secondary | ICD-10-CM | POA: Diagnosis not present

## 2017-06-16 DIAGNOSIS — M5134 Other intervertebral disc degeneration, thoracic region: Secondary | ICD-10-CM | POA: Diagnosis not present

## 2017-06-16 DIAGNOSIS — M5032 Other cervical disc degeneration, mid-cervical region, unspecified level: Secondary | ICD-10-CM | POA: Diagnosis not present

## 2017-06-16 DIAGNOSIS — M9902 Segmental and somatic dysfunction of thoracic region: Secondary | ICD-10-CM | POA: Diagnosis not present

## 2017-07-07 DIAGNOSIS — H3582 Retinal ischemia: Secondary | ICD-10-CM | POA: Diagnosis not present

## 2017-07-07 DIAGNOSIS — E113392 Type 2 diabetes mellitus with moderate nonproliferative diabetic retinopathy without macular edema, left eye: Secondary | ICD-10-CM | POA: Diagnosis not present

## 2017-07-07 DIAGNOSIS — H348312 Tributary (branch) retinal vein occlusion, right eye, stable: Secondary | ICD-10-CM | POA: Diagnosis not present

## 2017-07-07 DIAGNOSIS — E113311 Type 2 diabetes mellitus with moderate nonproliferative diabetic retinopathy with macular edema, right eye: Secondary | ICD-10-CM | POA: Diagnosis not present

## 2017-07-13 DIAGNOSIS — Z1231 Encounter for screening mammogram for malignant neoplasm of breast: Secondary | ICD-10-CM | POA: Diagnosis not present

## 2017-07-14 DIAGNOSIS — E08311 Diabetes mellitus due to underlying condition with unspecified diabetic retinopathy with macular edema: Secondary | ICD-10-CM | POA: Diagnosis not present

## 2017-07-14 DIAGNOSIS — E0590 Thyrotoxicosis, unspecified without thyrotoxic crisis or storm: Secondary | ICD-10-CM | POA: Diagnosis not present

## 2017-07-14 DIAGNOSIS — E118 Type 2 diabetes mellitus with unspecified complications: Secondary | ICD-10-CM | POA: Diagnosis not present

## 2017-07-15 DIAGNOSIS — M9901 Segmental and somatic dysfunction of cervical region: Secondary | ICD-10-CM | POA: Diagnosis not present

## 2017-07-15 DIAGNOSIS — M9902 Segmental and somatic dysfunction of thoracic region: Secondary | ICD-10-CM | POA: Diagnosis not present

## 2017-07-15 DIAGNOSIS — M5134 Other intervertebral disc degeneration, thoracic region: Secondary | ICD-10-CM | POA: Diagnosis not present

## 2017-07-15 DIAGNOSIS — M5032 Other cervical disc degeneration, mid-cervical region, unspecified level: Secondary | ICD-10-CM | POA: Diagnosis not present

## 2017-07-28 DIAGNOSIS — E08311 Diabetes mellitus due to underlying condition with unspecified diabetic retinopathy with macular edema: Secondary | ICD-10-CM | POA: Diagnosis not present

## 2017-07-28 DIAGNOSIS — E042 Nontoxic multinodular goiter: Secondary | ICD-10-CM | POA: Diagnosis not present

## 2017-07-28 DIAGNOSIS — E0590 Thyrotoxicosis, unspecified without thyrotoxic crisis or storm: Secondary | ICD-10-CM | POA: Diagnosis not present

## 2017-07-29 DIAGNOSIS — H35033 Hypertensive retinopathy, bilateral: Secondary | ICD-10-CM | POA: Diagnosis not present

## 2017-07-29 DIAGNOSIS — E113311 Type 2 diabetes mellitus with moderate nonproliferative diabetic retinopathy with macular edema, right eye: Secondary | ICD-10-CM | POA: Diagnosis not present

## 2017-07-29 DIAGNOSIS — H2513 Age-related nuclear cataract, bilateral: Secondary | ICD-10-CM | POA: Diagnosis not present

## 2017-07-29 DIAGNOSIS — H04123 Dry eye syndrome of bilateral lacrimal glands: Secondary | ICD-10-CM | POA: Diagnosis not present

## 2017-07-29 DIAGNOSIS — E113392 Type 2 diabetes mellitus with moderate nonproliferative diabetic retinopathy without macular edema, left eye: Secondary | ICD-10-CM | POA: Diagnosis not present

## 2017-07-29 DIAGNOSIS — H401131 Primary open-angle glaucoma, bilateral, mild stage: Secondary | ICD-10-CM | POA: Diagnosis not present

## 2017-08-25 ENCOUNTER — Encounter: Payer: Self-pay | Admitting: Registered"

## 2017-08-25 ENCOUNTER — Encounter: Payer: Medicare Other | Attending: Internal Medicine | Admitting: Registered"

## 2017-08-25 DIAGNOSIS — Z713 Dietary counseling and surveillance: Secondary | ICD-10-CM | POA: Diagnosis not present

## 2017-08-25 DIAGNOSIS — E119 Type 2 diabetes mellitus without complications: Secondary | ICD-10-CM | POA: Diagnosis not present

## 2017-08-25 DIAGNOSIS — E11649 Type 2 diabetes mellitus with hypoglycemia without coma: Secondary | ICD-10-CM

## 2017-08-25 NOTE — Patient Instructions (Addendum)
Try the Boost sample for breakfast and if you like it, get the Glucose Control variety. Aim to have vegetables daily with lunch  To help with constipation:  Eating more vegetables Continue drinking waters Consider getting in more physical activity, morning walks (3-5 week, 30-45 min) Consider taking Calm magnesium supplement  Ask your doctor how your should handle insulin when blood sugar is low before bed.  Use the rule 15 to treat low blood sugar

## 2017-08-25 NOTE — Progress Notes (Signed)
Diabetes Self-Management Education  Visit Type: First/Initial  Appt. Start Time: 0930 Appt. End Time: 1100  08/26/2017  [name], identified by name and date of birth, is a 67 y.o. female with a diagnosis of Diabetes: Type 2.   ASSESSMENT {t states she was diagnosed in 2001, had some education in 2007 and feels she knows what to do, but just doesn't do it. Pt states her A1c has been over 10% at one time, now at 8.7% but has frequent BG excursions. Pt reports checking feet 1x or less per week.  Pt states she had lap band surgery 9 years ago and had it filled 6 months ago. Pt states since the initial surgery she has had chronic constipation, GERD, feeling of certain foods sitting on stomach causing discomfort. Pt states the discomfort gets severe enough she feels she needs to vomit for relief. Patient states she used to love eating garden salads, but since surgery cannot eat iceburg, and other lettuce tends to stick, chicken makes her gag, and she has to drink water with meals to make the food move down her esophagus. Pt states she had endoscopy and was told her esophagus was inflamed but does not remember being given instruction of what she can do about it, she just drinks with meals. Pt states she will take a 14-day OTC ant-acid to help with the reflux.  Pt states the CPAP helps her sleep quality/quantity.  Pt states she is not active and never has been.   Pt states she lives along and doesn't like cooking for self. Pt states she ends up throwing food out when she cooks a meal. Pt states sometimes for dinner she will just have ice cream for supper. Pt states 1-2x week she allows herself to eat Cheetos. Pt states she doesn't eat at regular meals times but will sip on protein drinks throughout the day. Pt states she enjoys tomatoes and peanut butter but they both cause GERD.   Pt states she has a hard time remembering to take insulin and will experience BG in the 300x. Pt states  occasionally she will take too much insulin and have lows. Pt reports 2 weeks ago at 6pm her BG was 47 mg/dL and her neighbor happened to send her a text during this episode and knew something was wrong when pt replied nonsense. Pt states she keeps soda in the house for hypoglycemia and understands the amount to drink to treat, but because it tasted good she drank the 16 oz bottle and states when she checked her BG at 11:30 pm that evening it was 230 mg/dL  RD encouraged patient to return for additional education. RD would like to monitor progress on hypoglycemia control and/or treatment, review foot care, and scheduled eating and insulin use.  Boost High Protein Lot # [id number] Exp: Mar 16, 2018  Diabetes Self-Management Education - 08/25/17 0950      Visit Information   Visit Type  First/Initial      Initial Visit   Diabetes Type  Type 2    Are you currently following a meal plan?  No    Are you taking your medications as prescribed?  Yes    Date Diagnosed  2001      Health Coping   How would you rate your overall health?  Good      Psychosocial Assessment   Patient Belief/Attitude about Diabetes  Motivated to manage diabetes    How often do you need to have someone help  you when you read instructions, pamphlets, or other written materials from your doctor or pharmacy?  1 - Never    What is the last grade level you completed in school?  2 yrs of college      Complications   Last HgB A1C per patient/outside source  8.7 %    How often do you check your blood sugar?  3-4 times/day    Fasting Blood glucose range (mg/dL)  70-129;130-179 98-150     Number of hypoglycemic episodes per month  1    Can you tell when your blood sugar is low?  Yes    What do you do if your blood sugar is low?  drinks soda    Number of hyperglycemic episodes per week  7    Can you tell when your blood sugar is high?  Yes    What do you do if your blood sugar is high?  feels thirsty and tired, goes to sleep     Have you had a dilated eye exam in the past 12 months?  Yes    Have you had a dental exam in the past 12 months?  Yes    Are you checking your feet?  Yes    How many days per week are you checking your feet?  1      Dietary Intake   Breakfast  poached egg, toast OR Premier Pro Clear    Snack (morning)  sipping on premier    Lunch  1/2 [place] & cheese sand OR cheese & [place] rolled up OR chinese food OR steamed veggies    Snack (afternoon)  cheetos 2-3x week    Dinner  ~1 c vanilla ice cream 7 pm      Exercise   Exercise Type  ADL's    How many days per week to you exercise?  0    How many minutes per day do you exercise?  0    Total minutes per week of exercise  0      Patient Education   Previous Diabetes Education  Yes (please comment) 2007    Nutrition management   Role of diet in the treatment of diabetes and the relationship between the three main macronutrients and blood glucose level    Medications  Reviewed patients medication for diabetes, action, purpose, timing of dose and side effects.    Acute complications  Taught treatment of hypoglycemia - the 15 rule.    Psychosocial adjustment  Role of stress on diabetes      Individualized Goals (developed by patient)   Nutrition  General guidelines for healthy choices and portions discussed    Physical Activity  Exercise 3-5 times per week    Reducing Risk  treat hypoglycemia with 15 grams of carbs if blood glucose less than 70mg /dL      Outcomes   Expected Outcomes  Demonstrated interest in learning. Expect positive outcomes    Future DMSE  4-6 wks    Program Status  Not Completed     Individualized Plan for Diabetes Self-Management Training:   Learning Objective:  Patient will have a greater understanding of diabetes self-management. Patient education plan is to attend individual and/or group sessions per assessed needs and concerns.  Patient Instructions  Try the Boost sample for breakfast and if you like it, get  the Glucose Control variety. Aim to have vegetables daily with lunch  To help with constipation:  Eating more vegetables Continue drinking waters Consider getting in  more physical activity, morning walks (3-5 week, 30-45 min) Consider taking Calm magnesium supplement  Ask your doctor how your should handle insulin when blood sugar is low before bed.  Use the rule 15 to treat low blood sugar   Expected Outcomes:  Demonstrated interest in learning. Expect positive outcomes  Education material provided: My Plate and Snack sheet  If problems or questions, patient to contact team via:  Phone  Future DSME appointment: 4-6 wks

## 2017-08-26 DIAGNOSIS — E11649 Type 2 diabetes mellitus with hypoglycemia without coma: Secondary | ICD-10-CM | POA: Insufficient documentation

## 2017-09-01 DIAGNOSIS — E08311 Diabetes mellitus due to underlying condition with unspecified diabetic retinopathy with macular edema: Secondary | ICD-10-CM | POA: Diagnosis not present

## 2017-09-01 DIAGNOSIS — E0590 Thyrotoxicosis, unspecified without thyrotoxic crisis or storm: Secondary | ICD-10-CM | POA: Diagnosis not present

## 2017-09-09 DIAGNOSIS — E042 Nontoxic multinodular goiter: Secondary | ICD-10-CM | POA: Diagnosis not present

## 2017-09-09 DIAGNOSIS — E0590 Thyrotoxicosis, unspecified without thyrotoxic crisis or storm: Secondary | ICD-10-CM | POA: Diagnosis not present

## 2017-09-09 DIAGNOSIS — E08311 Diabetes mellitus due to underlying condition with unspecified diabetic retinopathy with macular edema: Secondary | ICD-10-CM | POA: Diagnosis not present

## 2017-10-06 ENCOUNTER — Ambulatory Visit: Payer: Medicare Other | Admitting: Registered"

## 2017-10-20 DIAGNOSIS — Z6831 Body mass index (BMI) 31.0-31.9, adult: Secondary | ICD-10-CM | POA: Diagnosis not present

## 2017-10-20 DIAGNOSIS — E042 Nontoxic multinodular goiter: Secondary | ICD-10-CM | POA: Diagnosis not present

## 2017-10-20 DIAGNOSIS — E08311 Diabetes mellitus due to underlying condition with unspecified diabetic retinopathy with macular edema: Secondary | ICD-10-CM | POA: Diagnosis not present

## 2017-10-20 DIAGNOSIS — E0590 Thyrotoxicosis, unspecified without thyrotoxic crisis or storm: Secondary | ICD-10-CM | POA: Diagnosis not present

## 2017-10-29 DIAGNOSIS — E113392 Type 2 diabetes mellitus with moderate nonproliferative diabetic retinopathy without macular edema, left eye: Secondary | ICD-10-CM | POA: Diagnosis not present

## 2017-10-29 DIAGNOSIS — Z9189 Other specified personal risk factors, not elsewhere classified: Secondary | ICD-10-CM | POA: Diagnosis not present

## 2017-10-29 DIAGNOSIS — H52223 Regular astigmatism, bilateral: Secondary | ICD-10-CM | POA: Diagnosis not present

## 2017-10-29 DIAGNOSIS — E113311 Type 2 diabetes mellitus with moderate nonproliferative diabetic retinopathy with macular edema, right eye: Secondary | ICD-10-CM | POA: Diagnosis not present

## 2017-10-29 DIAGNOSIS — H401131 Primary open-angle glaucoma, bilateral, mild stage: Secondary | ICD-10-CM | POA: Diagnosis not present

## 2017-10-29 DIAGNOSIS — H2513 Age-related nuclear cataract, bilateral: Secondary | ICD-10-CM | POA: Diagnosis not present

## 2017-10-29 DIAGNOSIS — Z872 Personal history of diseases of the skin and subcutaneous tissue: Secondary | ICD-10-CM | POA: Diagnosis not present

## 2017-10-29 DIAGNOSIS — H524 Presbyopia: Secondary | ICD-10-CM | POA: Diagnosis not present

## 2017-11-12 DIAGNOSIS — E0590 Thyrotoxicosis, unspecified without thyrotoxic crisis or storm: Secondary | ICD-10-CM | POA: Diagnosis not present

## 2017-11-12 DIAGNOSIS — E08311 Diabetes mellitus due to underlying condition with unspecified diabetic retinopathy with macular edema: Secondary | ICD-10-CM | POA: Diagnosis not present

## 2017-11-19 DIAGNOSIS — E0590 Thyrotoxicosis, unspecified without thyrotoxic crisis or storm: Secondary | ICD-10-CM | POA: Diagnosis not present

## 2017-11-19 DIAGNOSIS — E559 Vitamin D deficiency, unspecified: Secondary | ICD-10-CM | POA: Diagnosis not present

## 2017-11-19 DIAGNOSIS — Z8709 Personal history of other diseases of the respiratory system: Secondary | ICD-10-CM | POA: Diagnosis not present

## 2017-11-19 DIAGNOSIS — Z23 Encounter for immunization: Secondary | ICD-10-CM | POA: Diagnosis not present

## 2017-11-19 DIAGNOSIS — Z7984 Long term (current) use of oral hypoglycemic drugs: Secondary | ICD-10-CM | POA: Diagnosis not present

## 2017-11-19 DIAGNOSIS — E785 Hyperlipidemia, unspecified: Secondary | ICD-10-CM | POA: Diagnosis not present

## 2017-11-19 DIAGNOSIS — E083393 Diabetes mellitus due to underlying condition with moderate nonproliferative diabetic retinopathy without macular edema, bilateral: Secondary | ICD-10-CM | POA: Diagnosis not present

## 2017-11-19 DIAGNOSIS — E1129 Type 2 diabetes mellitus with other diabetic kidney complication: Secondary | ICD-10-CM | POA: Diagnosis not present

## 2017-11-19 DIAGNOSIS — I10 Essential (primary) hypertension: Secondary | ICD-10-CM | POA: Diagnosis not present

## 2017-11-19 DIAGNOSIS — G4733 Obstructive sleep apnea (adult) (pediatric): Secondary | ICD-10-CM | POA: Diagnosis not present

## 2017-11-19 DIAGNOSIS — E11319 Type 2 diabetes mellitus with unspecified diabetic retinopathy without macular edema: Secondary | ICD-10-CM | POA: Diagnosis not present

## 2017-11-25 DIAGNOSIS — Z6831 Body mass index (BMI) 31.0-31.9, adult: Secondary | ICD-10-CM | POA: Diagnosis not present

## 2017-11-25 DIAGNOSIS — E08311 Diabetes mellitus due to underlying condition with unspecified diabetic retinopathy with macular edema: Secondary | ICD-10-CM | POA: Diagnosis not present

## 2017-11-25 DIAGNOSIS — E0590 Thyrotoxicosis, unspecified without thyrotoxic crisis or storm: Secondary | ICD-10-CM | POA: Diagnosis not present

## 2017-11-25 DIAGNOSIS — E139 Other specified diabetes mellitus without complications: Secondary | ICD-10-CM | POA: Diagnosis not present

## 2017-11-25 DIAGNOSIS — E042 Nontoxic multinodular goiter: Secondary | ICD-10-CM | POA: Diagnosis not present

## 2018-01-06 DIAGNOSIS — E113311 Type 2 diabetes mellitus with moderate nonproliferative diabetic retinopathy with macular edema, right eye: Secondary | ICD-10-CM | POA: Diagnosis not present

## 2018-01-06 DIAGNOSIS — E113392 Type 2 diabetes mellitus with moderate nonproliferative diabetic retinopathy without macular edema, left eye: Secondary | ICD-10-CM | POA: Diagnosis not present

## 2018-01-06 DIAGNOSIS — H3582 Retinal ischemia: Secondary | ICD-10-CM | POA: Diagnosis not present

## 2018-01-06 DIAGNOSIS — H348312 Tributary (branch) retinal vein occlusion, right eye, stable: Secondary | ICD-10-CM | POA: Diagnosis not present

## 2018-01-13 DIAGNOSIS — E0590 Thyrotoxicosis, unspecified without thyrotoxic crisis or storm: Secondary | ICD-10-CM | POA: Diagnosis not present

## 2018-01-13 DIAGNOSIS — E08311 Diabetes mellitus due to underlying condition with unspecified diabetic retinopathy with macular edema: Secondary | ICD-10-CM | POA: Diagnosis not present

## 2018-01-20 DIAGNOSIS — E139 Other specified diabetes mellitus without complications: Secondary | ICD-10-CM | POA: Diagnosis not present

## 2018-01-20 DIAGNOSIS — E08311 Diabetes mellitus due to underlying condition with unspecified diabetic retinopathy with macular edema: Secondary | ICD-10-CM | POA: Diagnosis not present

## 2018-01-20 DIAGNOSIS — E042 Nontoxic multinodular goiter: Secondary | ICD-10-CM | POA: Diagnosis not present

## 2018-01-20 DIAGNOSIS — Z6831 Body mass index (BMI) 31.0-31.9, adult: Secondary | ICD-10-CM | POA: Diagnosis not present

## 2018-01-20 DIAGNOSIS — E0590 Thyrotoxicosis, unspecified without thyrotoxic crisis or storm: Secondary | ICD-10-CM | POA: Diagnosis not present

## 2018-02-05 DIAGNOSIS — E113311 Type 2 diabetes mellitus with moderate nonproliferative diabetic retinopathy with macular edema, right eye: Secondary | ICD-10-CM | POA: Diagnosis not present

## 2018-02-10 DIAGNOSIS — E1121 Type 2 diabetes mellitus with diabetic nephropathy: Secondary | ICD-10-CM | POA: Diagnosis not present

## 2018-02-17 DIAGNOSIS — E1121 Type 2 diabetes mellitus with diabetic nephropathy: Secondary | ICD-10-CM | POA: Diagnosis not present

## 2018-02-17 DIAGNOSIS — R809 Proteinuria, unspecified: Secondary | ICD-10-CM | POA: Diagnosis not present

## 2018-02-17 DIAGNOSIS — Z6836 Body mass index (BMI) 36.0-36.9, adult: Secondary | ICD-10-CM | POA: Diagnosis not present

## 2018-02-17 DIAGNOSIS — I10 Essential (primary) hypertension: Secondary | ICD-10-CM | POA: Diagnosis not present

## 2018-02-17 DIAGNOSIS — E871 Hypo-osmolality and hyponatremia: Secondary | ICD-10-CM | POA: Diagnosis not present

## 2018-03-10 DIAGNOSIS — H04123 Dry eye syndrome of bilateral lacrimal glands: Secondary | ICD-10-CM | POA: Diagnosis not present

## 2018-03-10 DIAGNOSIS — H16223 Keratoconjunctivitis sicca, not specified as Sjogren's, bilateral: Secondary | ICD-10-CM | POA: Diagnosis not present

## 2018-03-10 DIAGNOSIS — H401131 Primary open-angle glaucoma, bilateral, mild stage: Secondary | ICD-10-CM | POA: Diagnosis not present

## 2018-03-10 DIAGNOSIS — E113311 Type 2 diabetes mellitus with moderate nonproliferative diabetic retinopathy with macular edema, right eye: Secondary | ICD-10-CM | POA: Diagnosis not present

## 2018-03-11 DIAGNOSIS — I10 Essential (primary) hypertension: Secondary | ICD-10-CM | POA: Diagnosis not present

## 2018-03-16 DIAGNOSIS — E0590 Thyrotoxicosis, unspecified without thyrotoxic crisis or storm: Secondary | ICD-10-CM | POA: Diagnosis not present

## 2018-03-16 DIAGNOSIS — E139 Other specified diabetes mellitus without complications: Secondary | ICD-10-CM | POA: Diagnosis not present

## 2018-03-17 DIAGNOSIS — I10 Essential (primary) hypertension: Secondary | ICD-10-CM | POA: Diagnosis not present

## 2018-04-06 DIAGNOSIS — E042 Nontoxic multinodular goiter: Secondary | ICD-10-CM | POA: Diagnosis not present

## 2018-04-06 DIAGNOSIS — E08311 Diabetes mellitus due to underlying condition with unspecified diabetic retinopathy with macular edema: Secondary | ICD-10-CM | POA: Diagnosis not present

## 2018-04-06 DIAGNOSIS — E0590 Thyrotoxicosis, unspecified without thyrotoxic crisis or storm: Secondary | ICD-10-CM | POA: Diagnosis not present

## 2018-04-06 DIAGNOSIS — E139 Other specified diabetes mellitus without complications: Secondary | ICD-10-CM | POA: Diagnosis not present

## 2018-04-06 DIAGNOSIS — Z6831 Body mass index (BMI) 31.0-31.9, adult: Secondary | ICD-10-CM | POA: Diagnosis not present

## 2018-04-30 DIAGNOSIS — G44209 Tension-type headache, unspecified, not intractable: Secondary | ICD-10-CM | POA: Diagnosis not present

## 2018-06-02 DIAGNOSIS — Z6831 Body mass index (BMI) 31.0-31.9, adult: Secondary | ICD-10-CM | POA: Diagnosis not present

## 2018-06-02 DIAGNOSIS — E08311 Diabetes mellitus due to underlying condition with unspecified diabetic retinopathy with macular edema: Secondary | ICD-10-CM | POA: Diagnosis not present

## 2018-06-02 DIAGNOSIS — E139 Other specified diabetes mellitus without complications: Secondary | ICD-10-CM | POA: Diagnosis not present

## 2018-06-02 DIAGNOSIS — E0590 Thyrotoxicosis, unspecified without thyrotoxic crisis or storm: Secondary | ICD-10-CM | POA: Diagnosis not present

## 2018-06-02 DIAGNOSIS — E042 Nontoxic multinodular goiter: Secondary | ICD-10-CM | POA: Diagnosis not present

## 2018-06-04 DIAGNOSIS — E113311 Type 2 diabetes mellitus with moderate nonproliferative diabetic retinopathy with macular edema, right eye: Secondary | ICD-10-CM | POA: Diagnosis not present

## 2018-06-04 DIAGNOSIS — E139 Other specified diabetes mellitus without complications: Secondary | ICD-10-CM | POA: Diagnosis not present

## 2018-06-04 DIAGNOSIS — E08311 Diabetes mellitus due to underlying condition with unspecified diabetic retinopathy with macular edema: Secondary | ICD-10-CM | POA: Diagnosis not present

## 2018-06-04 DIAGNOSIS — E0590 Thyrotoxicosis, unspecified without thyrotoxic crisis or storm: Secondary | ICD-10-CM | POA: Diagnosis not present

## 2018-07-13 DIAGNOSIS — E785 Hyperlipidemia, unspecified: Secondary | ICD-10-CM | POA: Diagnosis not present

## 2018-07-13 DIAGNOSIS — E11319 Type 2 diabetes mellitus with unspecified diabetic retinopathy without macular edema: Secondary | ICD-10-CM | POA: Diagnosis not present

## 2018-07-13 DIAGNOSIS — E1129 Type 2 diabetes mellitus with other diabetic kidney complication: Secondary | ICD-10-CM | POA: Diagnosis not present

## 2018-07-13 DIAGNOSIS — I509 Heart failure, unspecified: Secondary | ICD-10-CM | POA: Diagnosis not present

## 2018-07-13 DIAGNOSIS — I10 Essential (primary) hypertension: Secondary | ICD-10-CM | POA: Diagnosis not present

## 2018-07-21 DIAGNOSIS — Z7189 Other specified counseling: Secondary | ICD-10-CM | POA: Diagnosis not present

## 2018-07-21 DIAGNOSIS — E042 Nontoxic multinodular goiter: Secondary | ICD-10-CM | POA: Diagnosis not present

## 2018-07-21 DIAGNOSIS — E08311 Diabetes mellitus due to underlying condition with unspecified diabetic retinopathy with macular edema: Secondary | ICD-10-CM | POA: Diagnosis not present

## 2018-07-21 DIAGNOSIS — E139 Other specified diabetes mellitus without complications: Secondary | ICD-10-CM | POA: Diagnosis not present

## 2018-07-21 DIAGNOSIS — E0590 Thyrotoxicosis, unspecified without thyrotoxic crisis or storm: Secondary | ICD-10-CM | POA: Diagnosis not present

## 2018-07-21 DIAGNOSIS — Z6831 Body mass index (BMI) 31.0-31.9, adult: Secondary | ICD-10-CM | POA: Diagnosis not present

## 2018-08-05 DIAGNOSIS — H524 Presbyopia: Secondary | ICD-10-CM | POA: Diagnosis not present

## 2018-08-05 DIAGNOSIS — H401131 Primary open-angle glaucoma, bilateral, mild stage: Secondary | ICD-10-CM | POA: Diagnosis not present

## 2018-08-05 DIAGNOSIS — H52223 Regular astigmatism, bilateral: Secondary | ICD-10-CM | POA: Diagnosis not present

## 2018-08-05 DIAGNOSIS — H04123 Dry eye syndrome of bilateral lacrimal glands: Secondary | ICD-10-CM | POA: Diagnosis not present

## 2018-08-05 DIAGNOSIS — H16223 Keratoconjunctivitis sicca, not specified as Sjogren's, bilateral: Secondary | ICD-10-CM | POA: Diagnosis not present

## 2018-08-05 DIAGNOSIS — E113311 Type 2 diabetes mellitus with moderate nonproliferative diabetic retinopathy with macular edema, right eye: Secondary | ICD-10-CM | POA: Diagnosis not present

## 2018-08-20 DIAGNOSIS — Z1231 Encounter for screening mammogram for malignant neoplasm of breast: Secondary | ICD-10-CM | POA: Diagnosis not present

## 2018-09-01 ENCOUNTER — Telehealth: Payer: Self-pay | Admitting: Cardiology

## 2018-09-01 NOTE — Telephone Encounter (Signed)

## 2018-09-05 NOTE — Progress Notes (Signed)
Virtual Visit via Telephone Note   This visit type was conducted due to national recommendations for restrictions regarding the COVID-19 Pandemic (e.g. social distancing) in an effort to limit this patient's exposure and mitigate transmission in our community.  Due to her co-morbid illnesses, this patient is at least at moderate risk for complications without adequate follow up.  This format is felt to be most appropriate for this patient at this time.  All issues noted in this document were discussed and addressed.  A limited physical exam was performed with this format.  Please refer to the patient's chart for her consent to telehealth for [facility].   Evaluation Performed:  Follow-up visit  This visit type was conducted due to national recommendations for restrictions regarding the COVID-19 Pandemic (e.g. social distancing).  This format is felt to be most appropriate for this patient at this time.  All issues noted in this document were discussed and addressed.  No physical exam was performed (except for noted visual exam findings with Video Visits).  Please refer to the patient's chart (MyChart message for video visits and phone note for telephone visits) for the patient's consent to telehealth for [facility].  Date:  09/06/2018   ID:  Kathleen [name], [place] Nov 19, 1950, MRN [id number]  Patient Location:  Home  Provider location:   [place]  PCP:  Leighton Ruff, MD  Cardiologist: Dr. Fransico Him Electrophysiologist:  None   Chief Complaint:  OSA  History of Present Illness:    Kathleen [name] is a 68 y.o. female who presents via audio/video conferencing for a telehealth visit today.      Kathleen [name] is a [phone].o. female who presents for followup of OSA. She is tolerating her CPAP. She is doing well with her CPAP device and thinks that She has gotten used to it.  She tolerates the mask and feels the pressure is adequate.  Since going on CPAP she feels rested in  the am and has no significant daytime sleepiness.  She occasionally will have some mouth dryness.  She does not think that he snores.   The patient does not have symptoms concerning for COVID-19 infection (fever, chills, cough, or new shortness of breath).    Prior CV studies:   The following studies were reviewed today:  PAP download  Past Medical History:  Diagnosis Date  . Carotid artery plaque    mild to moderate less tan 50% stenosis on u/s 2/14  . Complication of anesthesia    pt states stopped breathing when having tonsillectomy at age 39; pt states has had anesthesia since then without difficulty   . Diabetes mellitus   . Glaucoma   . Heart disease   . Hyperlipidemia   . Hypertension   . Menopause   . Mixed hyperlipidemia 09/22/2013  . Morbid obesity ([place])   . Multinodular goiter    AND HYPERTHYROIDISM   . Obesity (BMI 30-39.9) 04/30/2015  . OSA on CPAP   . Proliferative retinopathy    moderate, Dr. Posey Pronto  . Proteinuria    Dr. Hassell Done  . Pulmonary hypertension ([place])   . Right-sided heart failure ([place])   . Thyroid disease    pt currently on no medications   . Trigger finger    Dr. Rip Harbour   Past Surgical History:  Procedure Laterality Date  . GASTRIC BANDING PORT REVISION N/A 02/27/2015   Procedure: GASTRIC BANDING PORT REVISION;  Surgeon: Johnathan Hausen, MD;  Location: [facility] ORS;  Service: General;  Laterality: N/A;  . LAPAROSCOPIC GASTRIC BANDING  2010  . NERVE SURGERY  2001  . SPLIT NIGHT STUDY  08/03/2015  . TONSILLECTOMY  1958     Current Meds  Medication Sig  . ALPHAGAN P 0.15 % ophthalmic solution Place 1 drop into both eyes 2 (two) times daily.   Marland Kitchen amLODipine (NORVASC) 10 MG tablet Take 10 mg by mouth daily.  Marland Kitchen atorvastatin (LIPITOR) 40 MG tablet Take 40 mg by mouth daily.    . carboxymethylcellulose (REFRESH PLUS) 0.5 % SOLN Place 1 drop into both eyes 3 (three) times daily as needed (dry eyes).  . chlorthalidone (HYGROTON) 25 MG tablet Take 25 mg by mouth  daily.  Mariane Baumgarten Sodium (COLACE PO) Take by mouth.  . latanoprost (XALATAN) 0.005 % ophthalmic solution Place 1 drop into both eyes at bedtime.   Marland Kitchen LEVEMIR FLEXPEN 100 UNIT/ML SOPN Inject 10-20 Units into the skin daily at 10 pm. If blood sugar > 100 take 20 units, but takes less if lower than 100  . methimazole (TAPAZOLE) 10 MG tablet Take 5 mg by mouth daily.   . Multiple Vitamin (MULTIVITAMIN) capsule Take 1 capsule by mouth daily.    Marland Kitchen NOVOLOG FLEXPEN 100 UNIT/ML injection Inject 0-18 Units into the skin 3 (three) times daily with meals. Based on sliding scale  . OMEPRAZOLE PO Take by mouth.  . ONE TOUCH ULTRA TEST test strip   . timolol (TIMOPTIC) 0.5 % ophthalmic solution Place 1 drop into both eyes daily.      Allergies:   Ace inhibitors, Benicar [olmesartan], Epinephrine hcl, and Levaquin [levofloxacin in d5w]   Social History   Tobacco Use  . Smoking status: Former Smoker    Packs/day: 1.00    Years: 30.00    Pack years: 30.00    Types: Cigarettes    Quit date: 03/04/2007    Years since quitting: 11.5  . Smokeless tobacco: Never Used  Substance Use Topics  . Alcohol use: Yes    Alcohol/week: 0.0 standard drinks    Comment: occas  . Drug use: No     Family Hx: The patient's family history includes Heart attack in her father.  ROS:   Please see the history of present illness.      All other systems reviewed and are negative.   Labs/Other Tests and Data Reviewed:    Recent Labs: No results found for requested labs within last 8760 hours.   Recent Lipid Panel Lab Results  Component Value Date/Time   CHOL  02/11/2007 07:45 AM    138        ATP III CLASSIFICATION:  <200     mg/dL   Desirable  200-239  mg/dL   Borderline High  >=240    mg/dL   High   TRIG 60 02/11/2007 07:45 AM   HDL 44 02/11/2007 07:45 AM   CHOLHDL 3.1 02/11/2007 07:45 AM   LDLCALC  02/11/2007 07:45 AM    82        Total Cholesterol/HDL:CHD Risk Coronary Heart Disease Risk Table                      Men   Women  1/2 Average Risk   3.4   3.3    Wt Readings from Last 3 Encounters:  09/06/18 192 lb (87.1 kg)  11/27/16 191 lb 3.2 oz (86.7 kg)  06/23/16 188 lb (85.3 kg)     Objective:    Vital Signs:  BP (!) 114/56   Pulse 80   Ht 5\' 3"  (1.6 m)   Wt 192 lb (87.1 kg)   BMI 34.01 kg/m    ASSESSMENT & PLAN:    1.  OSA - the patient is tolerating PAP therapy well without any problems. The PAP download was reviewed today and showed an AHI of 3.1/hr on 9 cm H2O with 87% compliance in using more than 4 hours nightly.  The patient has been using and benefiting from PAP use and will continue to benefit from therapy.   2.  Hypertension - Her BP has been controlled at home.  She will continue on amlodipine 10mg  daily, Chlorthalidone 25mg  daily, and spironolactone 6.25mg  daily.    3. Obesity - I have encouraged her to get into a routine exercise program and cut back on carbs and portions.   COVID-19 Education: The signs and symptoms of COVID-19 were discussed with the patient and how to seek care for testing (follow up with PCP or arrange E-visit).  The importance of social distancing was discussed today.  Patient Risk:   After full review of this patient's clinical status, I feel that they are at least moderate risk at this time.  Time:   Today, I have spent 20 minutes directly with the patient on telephone including OSA, obesity and HTN.  We also reviewed the symptoms of COVID 19 and the ways to protect against contracting the virus with telehealth technology.  I spent an additional 5 minutes reviewing patient's chart including PAP compliance report.  Medication Adjustments/Labs and Tests Ordered: Current medicines are reviewed at length with the patient today.  Concerns regarding medicines are outlined above.  Tests Ordered: No orders of the defined types were placed in this encounter.  Medication Changes: No orders of the defined types were placed in this encounter.    Disposition:  Follow up in 1 year(s)  Signed, Fransico Him, MD  09/06/2018 8:03 AM    [facility]

## 2018-09-06 ENCOUNTER — Telehealth (INDEPENDENT_AMBULATORY_CARE_PROVIDER_SITE_OTHER): Payer: Medicare Other | Admitting: Cardiology

## 2018-09-06 ENCOUNTER — Encounter: Payer: Self-pay | Admitting: Cardiology

## 2018-09-06 ENCOUNTER — Other Ambulatory Visit: Payer: Self-pay

## 2018-09-06 VITALS — BP 114/56 | HR 80 | Ht 63.0 in | Wt 192.0 lb

## 2018-09-06 DIAGNOSIS — G4733 Obstructive sleep apnea (adult) (pediatric): Secondary | ICD-10-CM | POA: Diagnosis not present

## 2018-09-06 DIAGNOSIS — E669 Obesity, unspecified: Secondary | ICD-10-CM

## 2018-09-06 DIAGNOSIS — I10 Essential (primary) hypertension: Secondary | ICD-10-CM

## 2018-09-06 NOTE — Patient Instructions (Signed)

## 2018-10-01 DIAGNOSIS — I10 Essential (primary) hypertension: Secondary | ICD-10-CM | POA: Diagnosis not present

## 2018-10-01 DIAGNOSIS — I509 Heart failure, unspecified: Secondary | ICD-10-CM | POA: Diagnosis not present

## 2018-10-01 DIAGNOSIS — E1129 Type 2 diabetes mellitus with other diabetic kidney complication: Secondary | ICD-10-CM | POA: Diagnosis not present

## 2018-10-01 DIAGNOSIS — E11319 Type 2 diabetes mellitus with unspecified diabetic retinopathy without macular edema: Secondary | ICD-10-CM | POA: Diagnosis not present

## 2018-10-01 DIAGNOSIS — E785 Hyperlipidemia, unspecified: Secondary | ICD-10-CM | POA: Diagnosis not present

## 2018-10-08 DIAGNOSIS — E113311 Type 2 diabetes mellitus with moderate nonproliferative diabetic retinopathy with macular edema, right eye: Secondary | ICD-10-CM | POA: Diagnosis not present

## 2018-10-08 DIAGNOSIS — E113392 Type 2 diabetes mellitus with moderate nonproliferative diabetic retinopathy without macular edema, left eye: Secondary | ICD-10-CM | POA: Diagnosis not present

## 2018-10-08 DIAGNOSIS — H3582 Retinal ischemia: Secondary | ICD-10-CM | POA: Diagnosis not present

## 2018-10-08 DIAGNOSIS — H348312 Tributary (branch) retinal vein occlusion, right eye, stable: Secondary | ICD-10-CM | POA: Diagnosis not present

## 2018-10-18 DIAGNOSIS — E08311 Diabetes mellitus due to underlying condition with unspecified diabetic retinopathy with macular edema: Secondary | ICD-10-CM | POA: Diagnosis not present

## 2018-10-18 DIAGNOSIS — E0590 Thyrotoxicosis, unspecified without thyrotoxic crisis or storm: Secondary | ICD-10-CM | POA: Diagnosis not present

## 2018-10-18 DIAGNOSIS — E139 Other specified diabetes mellitus without complications: Secondary | ICD-10-CM | POA: Diagnosis not present

## 2018-10-21 DIAGNOSIS — E08311 Diabetes mellitus due to underlying condition with unspecified diabetic retinopathy with macular edema: Secondary | ICD-10-CM | POA: Diagnosis not present

## 2018-10-21 DIAGNOSIS — E139 Other specified diabetes mellitus without complications: Secondary | ICD-10-CM | POA: Diagnosis not present

## 2018-10-21 DIAGNOSIS — E042 Nontoxic multinodular goiter: Secondary | ICD-10-CM | POA: Diagnosis not present

## 2018-10-21 DIAGNOSIS — Z6831 Body mass index (BMI) 31.0-31.9, adult: Secondary | ICD-10-CM | POA: Diagnosis not present

## 2018-10-21 DIAGNOSIS — E0590 Thyrotoxicosis, unspecified without thyrotoxic crisis or storm: Secondary | ICD-10-CM | POA: Diagnosis not present

## 2018-10-21 DIAGNOSIS — Z7189 Other specified counseling: Secondary | ICD-10-CM | POA: Diagnosis not present

## 2018-11-02 DIAGNOSIS — N952 Postmenopausal atrophic vaginitis: Secondary | ICD-10-CM | POA: Diagnosis not present

## 2018-11-02 DIAGNOSIS — R109 Unspecified abdominal pain: Secondary | ICD-10-CM | POA: Diagnosis not present

## 2018-11-03 DIAGNOSIS — Z4651 Encounter for fitting and adjustment of gastric lap band: Secondary | ICD-10-CM | POA: Diagnosis not present

## 2018-11-09 DIAGNOSIS — E871 Hypo-osmolality and hyponatremia: Secondary | ICD-10-CM | POA: Diagnosis not present

## 2018-11-10 DIAGNOSIS — E875 Hyperkalemia: Secondary | ICD-10-CM | POA: Diagnosis not present

## 2018-11-12 DIAGNOSIS — H04123 Dry eye syndrome of bilateral lacrimal glands: Secondary | ICD-10-CM | POA: Diagnosis not present

## 2018-11-12 DIAGNOSIS — H16223 Keratoconjunctivitis sicca, not specified as Sjogren's, bilateral: Secondary | ICD-10-CM | POA: Diagnosis not present

## 2018-11-12 DIAGNOSIS — E113392 Type 2 diabetes mellitus with moderate nonproliferative diabetic retinopathy without macular edema, left eye: Secondary | ICD-10-CM | POA: Diagnosis not present

## 2018-11-12 DIAGNOSIS — H401131 Primary open-angle glaucoma, bilateral, mild stage: Secondary | ICD-10-CM | POA: Diagnosis not present

## 2018-11-12 DIAGNOSIS — E113311 Type 2 diabetes mellitus with moderate nonproliferative diabetic retinopathy with macular edema, right eye: Secondary | ICD-10-CM | POA: Diagnosis not present

## 2018-11-16 DIAGNOSIS — R809 Proteinuria, unspecified: Secondary | ICD-10-CM | POA: Diagnosis not present

## 2018-11-16 DIAGNOSIS — Z23 Encounter for immunization: Secondary | ICD-10-CM | POA: Diagnosis not present

## 2018-11-16 DIAGNOSIS — E875 Hyperkalemia: Secondary | ICD-10-CM | POA: Diagnosis not present

## 2018-11-16 DIAGNOSIS — E1121 Type 2 diabetes mellitus with diabetic nephropathy: Secondary | ICD-10-CM | POA: Diagnosis not present

## 2018-11-16 DIAGNOSIS — E871 Hypo-osmolality and hyponatremia: Secondary | ICD-10-CM | POA: Diagnosis not present

## 2018-11-16 DIAGNOSIS — Z1159 Encounter for screening for other viral diseases: Secondary | ICD-10-CM | POA: Diagnosis not present

## 2018-11-16 DIAGNOSIS — Z6836 Body mass index (BMI) 36.0-36.9, adult: Secondary | ICD-10-CM | POA: Diagnosis not present

## 2018-11-16 DIAGNOSIS — I10 Essential (primary) hypertension: Secondary | ICD-10-CM | POA: Diagnosis not present

## 2018-11-30 DIAGNOSIS — I10 Essential (primary) hypertension: Secondary | ICD-10-CM | POA: Diagnosis not present

## 2018-12-23 DIAGNOSIS — Z4651 Encounter for fitting and adjustment of gastric lap band: Secondary | ICD-10-CM | POA: Diagnosis not present

## 2019-01-25 DIAGNOSIS — I10 Essential (primary) hypertension: Secondary | ICD-10-CM | POA: Diagnosis not present

## 2019-01-25 DIAGNOSIS — E11319 Type 2 diabetes mellitus with unspecified diabetic retinopathy without macular edema: Secondary | ICD-10-CM | POA: Diagnosis not present

## 2019-01-25 DIAGNOSIS — I270 Primary pulmonary hypertension: Secondary | ICD-10-CM | POA: Diagnosis not present

## 2019-01-25 DIAGNOSIS — E1129 Type 2 diabetes mellitus with other diabetic kidney complication: Secondary | ICD-10-CM | POA: Diagnosis not present

## 2019-01-25 DIAGNOSIS — Z794 Long term (current) use of insulin: Secondary | ICD-10-CM | POA: Diagnosis not present

## 2019-01-25 DIAGNOSIS — I509 Heart failure, unspecified: Secondary | ICD-10-CM | POA: Diagnosis not present

## 2019-01-25 DIAGNOSIS — E785 Hyperlipidemia, unspecified: Secondary | ICD-10-CM | POA: Diagnosis not present

## 2019-02-03 DIAGNOSIS — E785 Hyperlipidemia, unspecified: Secondary | ICD-10-CM | POA: Diagnosis not present

## 2019-02-03 DIAGNOSIS — E1129 Type 2 diabetes mellitus with other diabetic kidney complication: Secondary | ICD-10-CM | POA: Diagnosis not present

## 2019-02-03 DIAGNOSIS — E559 Vitamin D deficiency, unspecified: Secondary | ICD-10-CM | POA: Diagnosis not present

## 2019-02-03 DIAGNOSIS — Z794 Long term (current) use of insulin: Secondary | ICD-10-CM | POA: Diagnosis not present

## 2019-02-03 DIAGNOSIS — I270 Primary pulmonary hypertension: Secondary | ICD-10-CM | POA: Diagnosis not present

## 2019-02-03 DIAGNOSIS — E113393 Type 2 diabetes mellitus with moderate nonproliferative diabetic retinopathy without macular edema, bilateral: Secondary | ICD-10-CM | POA: Diagnosis not present

## 2019-02-03 DIAGNOSIS — E0590 Thyrotoxicosis, unspecified without thyrotoxic crisis or storm: Secondary | ICD-10-CM | POA: Diagnosis not present

## 2019-02-03 DIAGNOSIS — I509 Heart failure, unspecified: Secondary | ICD-10-CM | POA: Diagnosis not present

## 2019-02-03 DIAGNOSIS — E11319 Type 2 diabetes mellitus with unspecified diabetic retinopathy without macular edema: Secondary | ICD-10-CM | POA: Diagnosis not present

## 2019-02-03 DIAGNOSIS — I10 Essential (primary) hypertension: Secondary | ICD-10-CM | POA: Diagnosis not present

## 2019-02-03 DIAGNOSIS — G4733 Obstructive sleep apnea (adult) (pediatric): Secondary | ICD-10-CM | POA: Diagnosis not present

## 2019-02-11 DIAGNOSIS — E113392 Type 2 diabetes mellitus with moderate nonproliferative diabetic retinopathy without macular edema, left eye: Secondary | ICD-10-CM | POA: Diagnosis not present

## 2019-02-11 DIAGNOSIS — E113311 Type 2 diabetes mellitus with moderate nonproliferative diabetic retinopathy with macular edema, right eye: Secondary | ICD-10-CM | POA: Diagnosis not present

## 2019-02-11 DIAGNOSIS — H348312 Tributary (branch) retinal vein occlusion, right eye, stable: Secondary | ICD-10-CM | POA: Diagnosis not present

## 2019-02-11 DIAGNOSIS — H43813 Vitreous degeneration, bilateral: Secondary | ICD-10-CM | POA: Diagnosis not present

## 2019-03-22 DIAGNOSIS — E08311 Diabetes mellitus due to underlying condition with unspecified diabetic retinopathy with macular edema: Secondary | ICD-10-CM | POA: Diagnosis not present

## 2019-03-22 DIAGNOSIS — Z7189 Other specified counseling: Secondary | ICD-10-CM | POA: Diagnosis not present

## 2019-03-22 DIAGNOSIS — Z6831 Body mass index (BMI) 31.0-31.9, adult: Secondary | ICD-10-CM | POA: Diagnosis not present

## 2019-03-22 DIAGNOSIS — E042 Nontoxic multinodular goiter: Secondary | ICD-10-CM | POA: Diagnosis not present

## 2019-03-22 DIAGNOSIS — E0590 Thyrotoxicosis, unspecified without thyrotoxic crisis or storm: Secondary | ICD-10-CM | POA: Diagnosis not present

## 2019-03-22 DIAGNOSIS — E139 Other specified diabetes mellitus without complications: Secondary | ICD-10-CM | POA: Diagnosis not present

## 2019-04-16 DIAGNOSIS — Z23 Encounter for immunization: Secondary | ICD-10-CM | POA: Diagnosis not present

## 2019-04-20 DIAGNOSIS — E0590 Thyrotoxicosis, unspecified without thyrotoxic crisis or storm: Secondary | ICD-10-CM | POA: Diagnosis not present

## 2019-04-20 DIAGNOSIS — Z6831 Body mass index (BMI) 31.0-31.9, adult: Secondary | ICD-10-CM | POA: Diagnosis not present

## 2019-04-20 DIAGNOSIS — E08311 Diabetes mellitus due to underlying condition with unspecified diabetic retinopathy with macular edema: Secondary | ICD-10-CM | POA: Diagnosis not present

## 2019-04-20 DIAGNOSIS — E139 Other specified diabetes mellitus without complications: Secondary | ICD-10-CM | POA: Diagnosis not present

## 2019-04-20 DIAGNOSIS — Z7189 Other specified counseling: Secondary | ICD-10-CM | POA: Diagnosis not present

## 2019-04-20 DIAGNOSIS — E042 Nontoxic multinodular goiter: Secondary | ICD-10-CM | POA: Diagnosis not present

## 2019-04-24 DIAGNOSIS — E1129 Type 2 diabetes mellitus with other diabetic kidney complication: Secondary | ICD-10-CM | POA: Diagnosis not present

## 2019-04-24 DIAGNOSIS — E11319 Type 2 diabetes mellitus with unspecified diabetic retinopathy without macular edema: Secondary | ICD-10-CM | POA: Diagnosis not present

## 2019-04-24 DIAGNOSIS — I270 Primary pulmonary hypertension: Secondary | ICD-10-CM | POA: Diagnosis not present

## 2019-04-24 DIAGNOSIS — I10 Essential (primary) hypertension: Secondary | ICD-10-CM | POA: Diagnosis not present

## 2019-04-24 DIAGNOSIS — E785 Hyperlipidemia, unspecified: Secondary | ICD-10-CM | POA: Diagnosis not present

## 2019-04-24 DIAGNOSIS — I509 Heart failure, unspecified: Secondary | ICD-10-CM | POA: Diagnosis not present

## 2019-04-25 DIAGNOSIS — H401131 Primary open-angle glaucoma, bilateral, mild stage: Secondary | ICD-10-CM | POA: Diagnosis not present

## 2019-05-06 DIAGNOSIS — G44209 Tension-type headache, unspecified, not intractable: Secondary | ICD-10-CM | POA: Diagnosis not present

## 2019-05-06 DIAGNOSIS — E785 Hyperlipidemia, unspecified: Secondary | ICD-10-CM | POA: Diagnosis not present

## 2019-05-06 DIAGNOSIS — H348312 Tributary (branch) retinal vein occlusion, right eye, stable: Secondary | ICD-10-CM | POA: Diagnosis not present

## 2019-05-06 DIAGNOSIS — H3582 Retinal ischemia: Secondary | ICD-10-CM | POA: Diagnosis not present

## 2019-05-06 DIAGNOSIS — I10 Essential (primary) hypertension: Secondary | ICD-10-CM | POA: Diagnosis not present

## 2019-05-06 DIAGNOSIS — M8588 Other specified disorders of bone density and structure, other site: Secondary | ICD-10-CM | POA: Diagnosis not present

## 2019-05-06 DIAGNOSIS — E119 Type 2 diabetes mellitus without complications: Secondary | ICD-10-CM | POA: Diagnosis not present

## 2019-05-06 DIAGNOSIS — G4733 Obstructive sleep apnea (adult) (pediatric): Secondary | ICD-10-CM | POA: Diagnosis not present

## 2019-05-06 DIAGNOSIS — E083393 Diabetes mellitus due to underlying condition with moderate nonproliferative diabetic retinopathy without macular edema, bilateral: Secondary | ICD-10-CM | POA: Diagnosis not present

## 2019-05-06 DIAGNOSIS — E0865 Diabetes mellitus due to underlying condition with hyperglycemia: Secondary | ICD-10-CM | POA: Diagnosis not present

## 2019-05-06 DIAGNOSIS — H43813 Vitreous degeneration, bilateral: Secondary | ICD-10-CM | POA: Diagnosis not present

## 2019-05-06 DIAGNOSIS — E113313 Type 2 diabetes mellitus with moderate nonproliferative diabetic retinopathy with macular edema, bilateral: Secondary | ICD-10-CM | POA: Diagnosis not present

## 2019-05-06 DIAGNOSIS — E0590 Thyrotoxicosis, unspecified without thyrotoxic crisis or storm: Secondary | ICD-10-CM | POA: Diagnosis not present

## 2019-05-07 DIAGNOSIS — E1129 Type 2 diabetes mellitus with other diabetic kidney complication: Secondary | ICD-10-CM | POA: Diagnosis not present

## 2019-05-07 DIAGNOSIS — I10 Essential (primary) hypertension: Secondary | ICD-10-CM | POA: Diagnosis not present

## 2019-05-07 DIAGNOSIS — I509 Heart failure, unspecified: Secondary | ICD-10-CM | POA: Diagnosis not present

## 2019-05-07 DIAGNOSIS — E11319 Type 2 diabetes mellitus with unspecified diabetic retinopathy without macular edema: Secondary | ICD-10-CM | POA: Diagnosis not present

## 2019-05-07 DIAGNOSIS — E785 Hyperlipidemia, unspecified: Secondary | ICD-10-CM | POA: Diagnosis not present

## 2019-05-07 DIAGNOSIS — I270 Primary pulmonary hypertension: Secondary | ICD-10-CM | POA: Diagnosis not present

## 2019-05-14 DIAGNOSIS — Z23 Encounter for immunization: Secondary | ICD-10-CM | POA: Diagnosis not present

## 2019-06-13 DIAGNOSIS — E08311 Diabetes mellitus due to underlying condition with unspecified diabetic retinopathy with macular edema: Secondary | ICD-10-CM | POA: Diagnosis not present

## 2019-06-20 DIAGNOSIS — Z7189 Other specified counseling: Secondary | ICD-10-CM | POA: Diagnosis not present

## 2019-06-20 DIAGNOSIS — E08311 Diabetes mellitus due to underlying condition with unspecified diabetic retinopathy with macular edema: Secondary | ICD-10-CM | POA: Diagnosis not present

## 2019-06-20 DIAGNOSIS — E042 Nontoxic multinodular goiter: Secondary | ICD-10-CM | POA: Diagnosis not present

## 2019-06-20 DIAGNOSIS — E0590 Thyrotoxicosis, unspecified without thyrotoxic crisis or storm: Secondary | ICD-10-CM | POA: Diagnosis not present

## 2019-06-20 DIAGNOSIS — E139 Other specified diabetes mellitus without complications: Secondary | ICD-10-CM | POA: Diagnosis not present

## 2019-06-20 DIAGNOSIS — Z6839 Body mass index (BMI) 39.0-39.9, adult: Secondary | ICD-10-CM | POA: Diagnosis not present

## 2019-07-27 DIAGNOSIS — E1121 Type 2 diabetes mellitus with diabetic nephropathy: Secondary | ICD-10-CM | POA: Diagnosis not present

## 2019-08-02 DIAGNOSIS — E113311 Type 2 diabetes mellitus with moderate nonproliferative diabetic retinopathy with macular edema, right eye: Secondary | ICD-10-CM | POA: Diagnosis not present

## 2019-08-02 DIAGNOSIS — H35363 Drusen (degenerative) of macula, bilateral: Secondary | ICD-10-CM | POA: Diagnosis not present

## 2019-08-02 DIAGNOSIS — E113392 Type 2 diabetes mellitus with moderate nonproliferative diabetic retinopathy without macular edema, left eye: Secondary | ICD-10-CM | POA: Diagnosis not present

## 2019-08-02 DIAGNOSIS — H348312 Tributary (branch) retinal vein occlusion, right eye, stable: Secondary | ICD-10-CM | POA: Diagnosis not present

## 2019-08-03 DIAGNOSIS — Z7189 Other specified counseling: Secondary | ICD-10-CM | POA: Diagnosis not present

## 2019-08-03 DIAGNOSIS — Z6839 Body mass index (BMI) 39.0-39.9, adult: Secondary | ICD-10-CM | POA: Diagnosis not present

## 2019-08-03 DIAGNOSIS — E042 Nontoxic multinodular goiter: Secondary | ICD-10-CM | POA: Diagnosis not present

## 2019-08-03 DIAGNOSIS — E08311 Diabetes mellitus due to underlying condition with unspecified diabetic retinopathy with macular edema: Secondary | ICD-10-CM | POA: Diagnosis not present

## 2019-08-03 DIAGNOSIS — E0590 Thyrotoxicosis, unspecified without thyrotoxic crisis or storm: Secondary | ICD-10-CM | POA: Diagnosis not present

## 2019-08-03 DIAGNOSIS — E139 Other specified diabetes mellitus without complications: Secondary | ICD-10-CM | POA: Diagnosis not present

## 2019-08-04 DIAGNOSIS — E875 Hyperkalemia: Secondary | ICD-10-CM | POA: Diagnosis not present

## 2019-08-04 DIAGNOSIS — E1121 Type 2 diabetes mellitus with diabetic nephropathy: Secondary | ICD-10-CM | POA: Diagnosis not present

## 2019-08-04 DIAGNOSIS — I10 Essential (primary) hypertension: Secondary | ICD-10-CM | POA: Diagnosis not present

## 2019-08-04 DIAGNOSIS — E871 Hypo-osmolality and hyponatremia: Secondary | ICD-10-CM | POA: Diagnosis not present

## 2019-08-04 DIAGNOSIS — R809 Proteinuria, unspecified: Secondary | ICD-10-CM | POA: Diagnosis not present

## 2019-09-07 DIAGNOSIS — E08311 Diabetes mellitus due to underlying condition with unspecified diabetic retinopathy with macular edema: Secondary | ICD-10-CM | POA: Diagnosis not present

## 2019-09-07 DIAGNOSIS — E1069 Type 1 diabetes mellitus with other specified complication: Secondary | ICD-10-CM | POA: Diagnosis not present

## 2019-09-07 DIAGNOSIS — E1121 Type 2 diabetes mellitus with diabetic nephropathy: Secondary | ICD-10-CM | POA: Diagnosis not present

## 2019-09-14 DIAGNOSIS — E1121 Type 2 diabetes mellitus with diabetic nephropathy: Secondary | ICD-10-CM | POA: Diagnosis not present

## 2019-09-15 DIAGNOSIS — E08311 Diabetes mellitus due to underlying condition with unspecified diabetic retinopathy with macular edema: Secondary | ICD-10-CM | POA: Diagnosis not present

## 2019-09-15 DIAGNOSIS — Z7189 Other specified counseling: Secondary | ICD-10-CM | POA: Diagnosis not present

## 2019-09-15 DIAGNOSIS — E139 Other specified diabetes mellitus without complications: Secondary | ICD-10-CM | POA: Diagnosis not present

## 2019-09-15 DIAGNOSIS — R809 Proteinuria, unspecified: Secondary | ICD-10-CM | POA: Diagnosis not present

## 2019-09-15 DIAGNOSIS — E042 Nontoxic multinodular goiter: Secondary | ICD-10-CM | POA: Diagnosis not present

## 2019-09-15 DIAGNOSIS — E0590 Thyrotoxicosis, unspecified without thyrotoxic crisis or storm: Secondary | ICD-10-CM | POA: Diagnosis not present

## 2019-09-15 DIAGNOSIS — Z6839 Body mass index (BMI) 39.0-39.9, adult: Secondary | ICD-10-CM | POA: Diagnosis not present

## 2019-09-15 DIAGNOSIS — E1069 Type 1 diabetes mellitus with other specified complication: Secondary | ICD-10-CM | POA: Diagnosis not present

## 2019-10-10 DIAGNOSIS — E785 Hyperlipidemia, unspecified: Secondary | ICD-10-CM | POA: Diagnosis not present

## 2019-10-10 DIAGNOSIS — I509 Heart failure, unspecified: Secondary | ICD-10-CM | POA: Diagnosis not present

## 2019-10-10 DIAGNOSIS — E11319 Type 2 diabetes mellitus with unspecified diabetic retinopathy without macular edema: Secondary | ICD-10-CM | POA: Diagnosis not present

## 2019-10-10 DIAGNOSIS — E1129 Type 2 diabetes mellitus with other diabetic kidney complication: Secondary | ICD-10-CM | POA: Diagnosis not present

## 2019-10-10 DIAGNOSIS — I10 Essential (primary) hypertension: Secondary | ICD-10-CM | POA: Diagnosis not present

## 2019-10-10 DIAGNOSIS — I270 Primary pulmonary hypertension: Secondary | ICD-10-CM | POA: Diagnosis not present

## 2019-10-25 DIAGNOSIS — H35363 Drusen (degenerative) of macula, bilateral: Secondary | ICD-10-CM | POA: Diagnosis not present

## 2019-10-25 DIAGNOSIS — H348312 Tributary (branch) retinal vein occlusion, right eye, stable: Secondary | ICD-10-CM | POA: Diagnosis not present

## 2019-10-25 DIAGNOSIS — E113392 Type 2 diabetes mellitus with moderate nonproliferative diabetic retinopathy without macular edema, left eye: Secondary | ICD-10-CM | POA: Diagnosis not present

## 2019-10-25 DIAGNOSIS — E113311 Type 2 diabetes mellitus with moderate nonproliferative diabetic retinopathy with macular edema, right eye: Secondary | ICD-10-CM | POA: Diagnosis not present

## 2019-10-27 DIAGNOSIS — E042 Nontoxic multinodular goiter: Secondary | ICD-10-CM | POA: Diagnosis not present

## 2019-10-27 DIAGNOSIS — Z6839 Body mass index (BMI) 39.0-39.9, adult: Secondary | ICD-10-CM | POA: Diagnosis not present

## 2019-10-27 DIAGNOSIS — R809 Proteinuria, unspecified: Secondary | ICD-10-CM | POA: Diagnosis not present

## 2019-10-27 DIAGNOSIS — E08311 Diabetes mellitus due to underlying condition with unspecified diabetic retinopathy with macular edema: Secondary | ICD-10-CM | POA: Diagnosis not present

## 2019-10-27 DIAGNOSIS — E1069 Type 1 diabetes mellitus with other specified complication: Secondary | ICD-10-CM | POA: Diagnosis not present

## 2019-10-27 DIAGNOSIS — E0590 Thyrotoxicosis, unspecified without thyrotoxic crisis or storm: Secondary | ICD-10-CM | POA: Diagnosis not present

## 2019-10-27 DIAGNOSIS — E139 Other specified diabetes mellitus without complications: Secondary | ICD-10-CM | POA: Diagnosis not present

## 2019-11-10 DIAGNOSIS — S61201A Unspecified open wound of left index finger without damage to nail, initial encounter: Secondary | ICD-10-CM | POA: Diagnosis not present

## 2019-11-13 DIAGNOSIS — S61201D Unspecified open wound of left index finger without damage to nail, subsequent encounter: Secondary | ICD-10-CM | POA: Diagnosis not present

## 2019-12-16 DIAGNOSIS — Z23 Encounter for immunization: Secondary | ICD-10-CM | POA: Diagnosis not present

## 2020-01-17 DIAGNOSIS — H43813 Vitreous degeneration, bilateral: Secondary | ICD-10-CM | POA: Diagnosis not present

## 2020-01-17 DIAGNOSIS — H353132 Nonexudative age-related macular degeneration, bilateral, intermediate dry stage: Secondary | ICD-10-CM | POA: Diagnosis not present

## 2020-01-17 DIAGNOSIS — H2513 Age-related nuclear cataract, bilateral: Secondary | ICD-10-CM | POA: Diagnosis not present

## 2020-01-17 DIAGNOSIS — E113313 Type 2 diabetes mellitus with moderate nonproliferative diabetic retinopathy with macular edema, bilateral: Secondary | ICD-10-CM | POA: Diagnosis not present

## 2020-01-18 DIAGNOSIS — H401131 Primary open-angle glaucoma, bilateral, mild stage: Secondary | ICD-10-CM | POA: Diagnosis not present

## 2020-01-20 DIAGNOSIS — E0590 Thyrotoxicosis, unspecified without thyrotoxic crisis or storm: Secondary | ICD-10-CM | POA: Diagnosis not present

## 2020-01-20 DIAGNOSIS — E139 Other specified diabetes mellitus without complications: Secondary | ICD-10-CM | POA: Diagnosis not present

## 2020-02-02 DIAGNOSIS — Z6839 Body mass index (BMI) 39.0-39.9, adult: Secondary | ICD-10-CM | POA: Diagnosis not present

## 2020-02-02 DIAGNOSIS — E1069 Type 1 diabetes mellitus with other specified complication: Secondary | ICD-10-CM | POA: Diagnosis not present

## 2020-02-02 DIAGNOSIS — E0590 Thyrotoxicosis, unspecified without thyrotoxic crisis or storm: Secondary | ICD-10-CM | POA: Diagnosis not present

## 2020-02-02 DIAGNOSIS — E042 Nontoxic multinodular goiter: Secondary | ICD-10-CM | POA: Diagnosis not present

## 2020-02-02 DIAGNOSIS — E08311 Diabetes mellitus due to underlying condition with unspecified diabetic retinopathy with macular edema: Secondary | ICD-10-CM | POA: Diagnosis not present

## 2020-02-02 DIAGNOSIS — E139 Other specified diabetes mellitus without complications: Secondary | ICD-10-CM | POA: Diagnosis not present

## 2020-02-02 DIAGNOSIS — R809 Proteinuria, unspecified: Secondary | ICD-10-CM | POA: Diagnosis not present

## 2020-03-06 DIAGNOSIS — H353132 Nonexudative age-related macular degeneration, bilateral, intermediate dry stage: Secondary | ICD-10-CM | POA: Diagnosis not present

## 2020-03-06 DIAGNOSIS — H348312 Tributary (branch) retinal vein occlusion, right eye, stable: Secondary | ICD-10-CM | POA: Diagnosis not present

## 2020-03-06 DIAGNOSIS — E113313 Type 2 diabetes mellitus with moderate nonproliferative diabetic retinopathy with macular edema, bilateral: Secondary | ICD-10-CM | POA: Diagnosis not present

## 2020-03-06 DIAGNOSIS — H35421 Microcystoid degeneration of retina, right eye: Secondary | ICD-10-CM | POA: Diagnosis not present

## 2020-03-28 DIAGNOSIS — H401131 Primary open-angle glaucoma, bilateral, mild stage: Secondary | ICD-10-CM | POA: Diagnosis not present

## 2020-03-28 DIAGNOSIS — H16223 Keratoconjunctivitis sicca, not specified as Sjogren's, bilateral: Secondary | ICD-10-CM | POA: Diagnosis not present

## 2020-03-28 DIAGNOSIS — H04123 Dry eye syndrome of bilateral lacrimal glands: Secondary | ICD-10-CM | POA: Diagnosis not present

## 2020-03-28 DIAGNOSIS — E113313 Type 2 diabetes mellitus with moderate nonproliferative diabetic retinopathy with macular edema, bilateral: Secondary | ICD-10-CM | POA: Diagnosis not present

## 2020-04-05 DIAGNOSIS — E1121 Type 2 diabetes mellitus with diabetic nephropathy: Secondary | ICD-10-CM | POA: Diagnosis not present

## 2020-04-10 DIAGNOSIS — E785 Hyperlipidemia, unspecified: Secondary | ICD-10-CM | POA: Diagnosis not present

## 2020-04-10 DIAGNOSIS — I10 Essential (primary) hypertension: Secondary | ICD-10-CM | POA: Diagnosis not present

## 2020-04-11 DIAGNOSIS — E871 Hypo-osmolality and hyponatremia: Secondary | ICD-10-CM | POA: Diagnosis not present

## 2020-04-11 DIAGNOSIS — R809 Proteinuria, unspecified: Secondary | ICD-10-CM | POA: Diagnosis not present

## 2020-04-11 DIAGNOSIS — I10 Essential (primary) hypertension: Secondary | ICD-10-CM | POA: Diagnosis not present

## 2020-04-11 DIAGNOSIS — E875 Hyperkalemia: Secondary | ICD-10-CM | POA: Diagnosis not present

## 2020-04-11 DIAGNOSIS — E1121 Type 2 diabetes mellitus with diabetic nephropathy: Secondary | ICD-10-CM | POA: Diagnosis not present

## 2020-04-24 DIAGNOSIS — E1121 Type 2 diabetes mellitus with diabetic nephropathy: Secondary | ICD-10-CM | POA: Diagnosis not present

## 2020-04-26 DIAGNOSIS — E1129 Type 2 diabetes mellitus with other diabetic kidney complication: Secondary | ICD-10-CM | POA: Diagnosis not present

## 2020-04-26 DIAGNOSIS — E11319 Type 2 diabetes mellitus with unspecified diabetic retinopathy without macular edema: Secondary | ICD-10-CM | POA: Diagnosis not present

## 2020-04-26 DIAGNOSIS — I270 Primary pulmonary hypertension: Secondary | ICD-10-CM | POA: Diagnosis not present

## 2020-04-26 DIAGNOSIS — I10 Essential (primary) hypertension: Secondary | ICD-10-CM | POA: Diagnosis not present

## 2020-04-26 DIAGNOSIS — I509 Heart failure, unspecified: Secondary | ICD-10-CM | POA: Diagnosis not present

## 2020-04-26 DIAGNOSIS — E785 Hyperlipidemia, unspecified: Secondary | ICD-10-CM | POA: Diagnosis not present

## 2020-05-02 DIAGNOSIS — E113313 Type 2 diabetes mellitus with moderate nonproliferative diabetic retinopathy with macular edema, bilateral: Secondary | ICD-10-CM | POA: Diagnosis not present

## 2020-05-02 DIAGNOSIS — H353132 Nonexudative age-related macular degeneration, bilateral, intermediate dry stage: Secondary | ICD-10-CM | POA: Diagnosis not present

## 2020-05-02 DIAGNOSIS — H348312 Tributary (branch) retinal vein occlusion, right eye, stable: Secondary | ICD-10-CM | POA: Diagnosis not present

## 2020-05-02 DIAGNOSIS — H3582 Retinal ischemia: Secondary | ICD-10-CM | POA: Diagnosis not present

## 2020-05-10 DIAGNOSIS — E113313 Type 2 diabetes mellitus with moderate nonproliferative diabetic retinopathy with macular edema, bilateral: Secondary | ICD-10-CM | POA: Diagnosis not present

## 2020-05-10 DIAGNOSIS — H401131 Primary open-angle glaucoma, bilateral, mild stage: Secondary | ICD-10-CM | POA: Diagnosis not present

## 2020-05-10 DIAGNOSIS — H16223 Keratoconjunctivitis sicca, not specified as Sjogren's, bilateral: Secondary | ICD-10-CM | POA: Diagnosis not present

## 2020-05-10 DIAGNOSIS — H04123 Dry eye syndrome of bilateral lacrimal glands: Secondary | ICD-10-CM | POA: Diagnosis not present

## 2020-05-10 DIAGNOSIS — H25813 Combined forms of age-related cataract, bilateral: Secondary | ICD-10-CM | POA: Diagnosis not present

## 2020-05-16 DIAGNOSIS — H25813 Combined forms of age-related cataract, bilateral: Secondary | ICD-10-CM | POA: Diagnosis not present

## 2020-05-16 DIAGNOSIS — H25811 Combined forms of age-related cataract, right eye: Secondary | ICD-10-CM | POA: Diagnosis not present

## 2020-05-31 DIAGNOSIS — E08311 Diabetes mellitus due to underlying condition with unspecified diabetic retinopathy with macular edema: Secondary | ICD-10-CM | POA: Diagnosis not present

## 2020-05-31 DIAGNOSIS — E0590 Thyrotoxicosis, unspecified without thyrotoxic crisis or storm: Secondary | ICD-10-CM | POA: Diagnosis not present

## 2020-06-05 ENCOUNTER — Encounter: Payer: Self-pay | Admitting: Physician Assistant

## 2020-06-05 DIAGNOSIS — H401131 Primary open-angle glaucoma, bilateral, mild stage: Secondary | ICD-10-CM | POA: Diagnosis not present

## 2020-06-05 DIAGNOSIS — H25811 Combined forms of age-related cataract, right eye: Secondary | ICD-10-CM | POA: Diagnosis not present

## 2020-06-05 DIAGNOSIS — H268 Other specified cataract: Secondary | ICD-10-CM | POA: Diagnosis not present

## 2020-06-05 NOTE — Progress Notes (Signed)
Virtual Visit via Telephone Note   This visit type was conducted due to national recommendations for restrictions regarding the COVID-19 Pandemic (e.g. social distancing) in an effort to limit this patient's exposure and mitigate transmission in our community.  Due to her co-morbid illnesses, this patient is at least at moderate risk for complications without adequate follow up.  This format is felt to be most appropriate for this patient at this time.  The patient requested telephone only. All issues noted in this document were discussed and addressed.  No physical exam could be performed with this format.  Please refer to the patient's chart for her  consent to telehealth for [facility].   The patient was identified using 2 identifiers.  Date:  06/06/2020   ID:  [name], DOB Jan 30, 1951, MRN [id number]  Patient Location: Home Provider Location: Office/Clinic  PCP:  Leighton Ruff, MD  Cardiologist:  Fransico Him, MD  Electrophysiologist:  None   Evaluation Performed:  Follow-Up Visit  Chief Complaint:  Evaluate PVCs  History of Present Illness:    Kathleen [name] is a 70 y.o. female with OSA, obesity s/p lap band surgery, HTN, mild-moderate bilateral carotid artery plaque in 2014, PVCs, sinus bradycardia, CKD stage III, DM, HTN, HLD, hyperthyroidism (followed by endocrinology), obesity, hyperthyroidism, proteinuria, right heart failure in 2008 and abnormal stress test who presents for evaluation of PVCs.   She saw Dr. Irish Lack many years remotely up until 2018 and more recently she's been followed by Dr. Radford Pax for OSA. Per chart review she has a history of admission in 2008 for severe SOB with DC summary outlining right heart failure requiring diuresis (20lb), severe OSA, and COPD. I do not see an echo mentioned in that report and we do not have an echo on file. She states that this issue resolved after she had lap band surgery for obesity. She also had a stress test in  2008 showing a reversible anterior wall defect but with reduced specificity and sensitivity due to obesity and slight difference in breast positioning during the study - overall felt low risk, EF 69%, unremarkable pharmacologic stress test per report. Dr. Irish Lack had recommended medical management. He followed her for HTN and PVCs, stating she was not on a BB due to baseline bradycardia. Event monitor 2010 showed NSR with PVCs. She had frequent PVCs demonstrated on EKGs in 2015 and 2016. Last EKG in 2018 showed NSR with occasional PACs with possible QT prolongation but hand calculation closer to 4107ms. Father had heart blockages starting at age 29.  She reports she had cataract surgery recently and was informed she was having frequent PVCs. She has had no cardiac awareness of these and denies any recent angina or dyspnea. No syncope. She occasionally gets lightheaded/shaky but this is only when she has the occasional issue with hypoglycemia. She feels fatigued and cold in the mornings sometimes. She follows with endocrinology for thyroid disease (also has DM) and [facility] for nephrology but does not recall the kidney doctor's name. She reports cholesterol is managed by PCP/endocrinology. Her PCP is retiring and she's transitioning to the PA there in a few months but has not seen them recently. She has not had an EKG elsewhere recently. She has a wrist BP cuff showing her readings in the 749S-496P systolic this AM (I.e. 591/63) but unclear if accurate, since previous readings in EMR were frequently controlled.    Labs Independently Reviewed [facility] 04/2020 BUN 23, Cr 1.73, Hgb 13.6  2019 LDL 98 (PCP), K 4.9  Past Medical History:  Diagnosis Date  . Carotid artery plaque    mild to moderate less than 50% stenosis on u/s 2/14  . CKD (chronic kidney disease), stage III ([place])   . Complication of anesthesia    pt states stopped breathing when having tonsillectomy at age 51; pt states has had anesthesia  since then without difficulty   . Diabetes mellitus   . Glaucoma   . Heart disease   . Hyperlipidemia   . Hypertension   . Menopause   . Mixed hyperlipidemia 09/22/2013  . Morbid obesity ([place])   . Multinodular goiter    AND HYPERTHYROIDISM   . Obesity (BMI 30-39.9) 04/30/2015  . OSA on CPAP   . Proliferative retinopathy    moderate, Dr. Posey Pronto  . Proteinuria    Dr. Hassell Done  . Pulmonary hypertension ([place])   . PVC's (premature ventricular contractions)   . Right-sided heart failure ([place])   . Sinus bradycardia   . Thyroid disease    pt currently on no medications   . Trigger finger    Dr. Rip Harbour   Past Surgical History:  Procedure Laterality Date  . GASTRIC BANDING PORT REVISION N/A 02/27/2015   Procedure: GASTRIC BANDING PORT REVISION;  Surgeon: Johnathan Hausen, MD;  Location: [facility];  Service: General;  Laterality: N/A;  . LAPAROSCOPIC GASTRIC BANDING  2010  . NERVE SURGERY  2001  . SPLIT NIGHT STUDY  08/03/2015  . TONSILLECTOMY  1958     Current Meds  Medication Sig  . ALPHAGAN P 0.15 % ophthalmic solution Place 1 drop into both eyes 2 (two) times daily.   Marland Kitchen amLODipine (NORVASC) 10 MG tablet Take 10 mg by mouth daily.  Marland Kitchen atorvastatin (LIPITOR) 40 MG tablet Take 40 mg by mouth daily.  . carboxymethylcellulose (REFRESH PLUS) 0.5 % SOLN Place 1 drop into both eyes 3 (three) times daily as needed (dry eyes).  . chlorthalidone (HYGROTON) 25 MG tablet Take 25 mg by mouth daily.  . dapagliflozin propanediol (FARXIGA) 10 MG TABS tablet Take 10 mg by mouth daily.  Kathleen [name] Sodium (COLACE PO) Take by mouth.  . latanoprost (XALATAN) 0.005 % ophthalmic solution Place 1 drop into both eyes at bedtime.   Marland Kitchen LEVEMIR FLEXPEN 100 UNIT/ML SOPN Inject 10-20 Units into the skin daily at 10 pm. If blood sugar > 100 take 20 units, but takes less if lower than 100  . methimazole (TAPAZOLE) 10 MG tablet Take 5 mg by mouth daily.   . Multiple Vitamin (MULTIVITAMIN) capsule Take 1 capsule by mouth  daily.  Marland Kitchen NOVOLOG FLEXPEN 100 UNIT/ML injection Inject 0-18 Units into the skin 3 (three) times daily with meals. Based on sliding scale  . OMEPRAZOLE PO Take 20 mg by mouth daily.  . ONE TOUCH ULTRA TEST test strip   . spironolactone (ALDACTONE) 25 MG tablet Take 25 mg by mouth daily.   . timolol (TIMOPTIC) 0.5 % ophthalmic solution Place 1 drop into both eyes daily.      Allergies:   Ace inhibitors, Benicar [olmesartan], Epinephrine hcl, and Levaquin [levofloxacin in d5w]   Social History   Tobacco Use  . Smoking status: Former Smoker    Packs/day: 1.00    Years: 30.00    Pack years: 30.00    Types: Cigarettes    Quit date: 03/04/2007    Years since quitting: 13.2  . Smokeless tobacco: Never Used  Vaping Use  . Vaping Use: Never used  Substance Use Topics  . Alcohol use: Yes    Alcohol/week: 0.0 standard drinks    Comment: occas  . Drug use: No     Family Hx: The patient's family history includes Heart attack in her father.  ROS:   Please see the history of present illness.    All other systems reviewed and are negative.   Prior CV studies:   The following studies were reviewed today:  Stress test scan 2008  Carotid US 2014 IMPRESSION:   Mild - moderate heterogeneous calcified and fibrofatty atheromatous  plaque results in less than 50% diameter stenosis bilaterally.   There has been only mild subjective progression of plaque compared  to 01/15/2007.     Labs/Other Tests and Data Reviewed:    EKG:  An ECG dated 06/23/16 was personally reviewed today and demonstrated:  NSR 72bpm, occasional PACs, possible prolonged QT interval (431ms by hand calculation), nonspecific TW flattening, no acute STT changes  Recent Labs: No results found for requested labs within last 8760 hours.   Recent Lipid Panel Lab Results  Component Value Date/Time   CHOL  02/11/2007 07:45 AM    138        ATP III CLASSIFICATION:  <200     mg/dL   Desirable  200-239  mg/dL    Borderline High  >=240    mg/dL   High   TRIG 60 02/11/2007 07:45 AM   HDL 44 02/11/2007 07:45 AM   CHOLHDL 3.1 02/11/2007 07:45 AM   LDLCALC  02/11/2007 07:45 AM    82        Total Cholesterol/HDL:CHD Risk Coronary Heart Disease Risk Table                     Men   Women  1/2 Average Risk   3.4   3.3    Wt Readings from Last 3 Encounters:  06/06/20 192 lb (87.1 kg)  09/06/18 192 lb (87.1 kg)  11/27/16 191 lb 3.2 oz (86.7 kg)     Objective:    Vital Signs:  BP (!) 158/70   Pulse 77   Ht 5\' 3"  (1.6 m)   Wt 192 lb (87.1 kg)   BMI 34.01 kg/m    VS reviewed. General - female in no acute distress Pulm - No labored breathing, no coughing during visit, no audible wheezing, speaking in full sentences Neuro - A+Ox3, no slurred speech, answers questions appropriately Psych - Pleasant affect  ASSESSMENT & PLAN:    1. PVCs - this is not a new finding for patient, previously demonstrated back to 2010. However, given frequency at which this occurred on prior tracings, we do need to get an idea of the PVC burden. Will arrange 3 day Zio XT. We also need to get an idea of what her heart looks like structurally as the management will change if there is any evidence of LV dysfunction. Hold off on starting treatment until we see her monitor given prior reported sinus bradycardia in Dr. Hassell Done note. When she comes in for her study we will arrange CMET, TSH, Mg. She had normal Hgb in 04/2020. We will arrange for her next f/u to be in person so we can obtain an EKG. If she has an office visit in person with another provider between now and our next visit that has the capability of doing an EKG to share with [place], she was encouraged to do so.  2. Essential HTN - Suboptimal blood pressure  control noted today. We need a better idea of what it's running at home. The patient was provided instructions on monitoring blood pressure at home for 1 week with ARM CUFF instead and relaying results to our office.  Her meds are typically managed by renal per her report so we may engage them in recommendations if needed.  3. Carotid artery disease - noted on [place] 2014. Overdue for f/u. Will arrange carotid artery duplex. Check fasting lipids when she comes in for labs along with updated CMET.  4. History of abnormal stress test - difficult to know if this was a true defect or related to breast attenuation at the time. Study specificity and sensitivity was degraded at the time due to habitus. She is not having any unstable anginal symptoms. Will await 2D echocardiogram and PVC burden before deciding about need for subsequent ischemic testing.   Time:   Today, I have spent 17 minutes with the patient with telehealth technology discussing the above problems.     Medication Adjustments/Labs and Tests Ordered: Current medicines are reviewed at length with the patient today.  Testing and concerns regarding medicines are outlined above.    Follow Up:  With me IN PERSON after completion of above testing. Will need EKG and physical exam at visit. She is also overdue for OSA f/u with Dr. Radford Pax so will request MA assist with this.  Signed, Charlie Pitter, PA-C  06/06/2020 8:53 AM    [place]

## 2020-06-06 ENCOUNTER — Encounter: Payer: Self-pay | Admitting: Physician Assistant

## 2020-06-06 ENCOUNTER — Telehealth (INDEPENDENT_AMBULATORY_CARE_PROVIDER_SITE_OTHER): Payer: Medicare Other | Admitting: Physician Assistant

## 2020-06-06 ENCOUNTER — Telehealth: Payer: Self-pay | Admitting: *Deleted

## 2020-06-06 ENCOUNTER — Ambulatory Visit (INDEPENDENT_AMBULATORY_CARE_PROVIDER_SITE_OTHER): Payer: Medicare Other

## 2020-06-06 ENCOUNTER — Encounter: Payer: Self-pay | Admitting: Radiology

## 2020-06-06 ENCOUNTER — Other Ambulatory Visit: Payer: Self-pay

## 2020-06-06 VITALS — BP 158/70 | HR 77 | Ht 63.0 in | Wt 192.0 lb

## 2020-06-06 DIAGNOSIS — I493 Ventricular premature depolarization: Secondary | ICD-10-CM

## 2020-06-06 DIAGNOSIS — I6523 Occlusion and stenosis of bilateral carotid arteries: Secondary | ICD-10-CM

## 2020-06-06 DIAGNOSIS — I10 Essential (primary) hypertension: Secondary | ICD-10-CM

## 2020-06-06 DIAGNOSIS — R9439 Abnormal result of other cardiovascular function study: Secondary | ICD-10-CM

## 2020-06-06 NOTE — Progress Notes (Signed)
Enrolled patient for a 3 day Zio XT monitor to be mailed to patients home  

## 2020-06-06 NOTE — Telephone Encounter (Signed)
  Patient Consent for Virtual Visit         Kathleen [name] has provided verbal consent on 06/06/2020 for a virtual visit (video or telephone).   CONSENT FOR VIRTUAL VISIT FOR:  Kathleen [name]  By participating in this virtual visit I agree to the following:  I hereby voluntarily request, consent and authorize [place] and its employed or contracted physicians, physician assistants, nurse practitioners or other licensed health care professionals (the Practitioner), to provide me with telemedicine health care services (the "Services") as deemed necessary by the treating Practitioner. I acknowledge and consent to receive the Services by the Practitioner via telemedicine. I understand that the telemedicine visit will involve communicating with the Practitioner through live audiovisual communication technology and the disclosure of certain medical information by electronic transmission. I acknowledge that I have been given the opportunity to request an in-person assessment or other available alternative prior to the telemedicine visit and am voluntarily participating in the telemedicine visit.  I understand that I have the right to withhold or withdraw my consent to the use of telemedicine in the course of my care at any time, without affecting my right to future care or treatment, and that the Practitioner or I may terminate the telemedicine visit at any time. I understand that I have the right to inspect all information obtained and/or recorded in the course of the telemedicine visit and may receive copies of available information for a reasonable fee.  I understand that some of the potential risks of receiving the Services via telemedicine include:  Marland Kitchen Delay or interruption in medical evaluation due to technological equipment failure or disruption; . Information transmitted may not be sufficient (e.g. poor resolution of images) to allow for appropriate medical decision making by the  Practitioner; and/or  . In rare instances, security protocols could fail, causing a breach of personal health information.  Furthermore, I acknowledge that it is my responsibility to provide information about my medical history, conditions and care that is complete and accurate to the best of my ability. I acknowledge that Practitioner's advice, recommendations, and/or decision may be based on factors not within their control, such as incomplete or inaccurate data provided by me or distortions of diagnostic images or specimens that may result from electronic transmissions. I understand that the practice of medicine is not an exact science and that Practitioner makes no warranties or guarantees regarding treatment outcomes. I acknowledge that a copy of this consent can be made available to me via my patient portal ([facility]), or I can request a printed copy by calling the office of [place].    I understand that my insurance will be billed for this visit.   I have read or had this consent read to me. . I understand the contents of this consent, which adequately explains the benefits and risks of the Services being provided via telemedicine.  . I have been provided ample opportunity to ask questions regarding this consent and the Services and have had my questions answered to my satisfaction. . I give my informed consent for the services to be provided through the use of telemedicine in my medical care

## 2020-06-06 NOTE — Addendum Note (Signed)
Addended by: Charlie Pitter on: 06/06/2020 12:31 PM   Modules accepted: Level of Service

## 2020-06-06 NOTE — Patient Instructions (Addendum)
Medication Instructions:  Your physician recommends that you continue on your current medications as directed. Please refer to the Current Medication list given to you today.  *If you need a refill on your cardiac medications before your next appointment, please call your pharmacy*   Lab Work: CMET, TSH, MAGNESIUM, & FASTING LIPID ON THE DAY OF ONE OF THE TESTING.. I WILL CALL AND LET YOU KNOW WHAT DAY.   If you have labs (blood work) drawn today and your tests are completely normal, you will receive your results only by: Marland Kitchen MyChart Message (if you have MyChart) OR . A paper copy in the mail If you have any lab test that is abnormal or we need to change your treatment, we will call you to review the results.   Testing/Procedures: Your physician has requested that you have an echocardiogram.  Echocardiography is a painless test that uses sound waves to create images of your heart. It provides your doctor with information about the size and shape of your heart and how well your heart's chambers and valves are working. This procedure takes approximately one hour. There are no restrictions for this procedure.   Your physician has requested that you have a carotid duplex. This test is an ultrasound of the carotid arteries in your neck. It looks at blood flow through these arteries that supply the brain with blood. Allow one hour for this exam. There are no restrictions or special instructions.  SOMEONE WILL CALL YOU TO SCHEDULE THE TESTS.  ONCE THEY HAVE, I WILL CALL AND SCHEDULE YOU TO FOLLOW-UP WITH DAYNA, IN OFFICE.  ZIO XT- Long Term Monitor Instructions   Your physician has requested you wear your ZIO patch monitor 3 days.   This is a single patch monitor.  [employer] supplies one patch monitor per enrollment.  Additional stickers are not available.   Please do not apply patch if you will be having a Nuclear Stress Test, Echocardiogram, Cardiac CT, MRI, or Chest Xray during the time frame  you would be wearing the monitor. The patch cannot be worn during these tests.  You cannot remove and re-apply the ZIO XT patch monitor.   Your ZIO patch monitor will be sent USPS Priority mail from [facility] directly to your home address. The monitor may also be mailed to a PO BOX if home delivery is not available.   It may take 3-5 days to receive your monitor after you have been enrolled.   Once you have received you monitor, please review enclosed instructions.  Your monitor has already been registered assigning a specific monitor serial # to you.   Applying the monitor   Shave hair from upper left chest.   Hold abrader disc by orange tab.  Rub abrader in 40 strokes over left upper chest as indicated in your monitor instructions.   Clean area with 4 enclosed alcohol pads .  Use all pads to assure are is cleaned thoroughly.  Let dry.   Apply patch as indicated in monitor instructions.  Patch will be place under collarbone on left side of chest with arrow pointing upward.   Rub patch adhesive wings for 2 minutes.Remove white label marked "1".  Remove white label marked "2".  Rub patch adhesive wings for 2 additional minutes.   While looking in a mirror, press and release button in center of patch.  A small green light will flash 3-4 times .  This will be your only indicator the monitor has been turned on.  Do not shower for the first 24 hours.  You may shower after the first 24 hours.   Press button if you feel a symptom. You will hear a small click.  Record Date, Time and Symptom in the Patient Log Book.   When you are ready to remove patch, follow instructions on last 2 pages of Patient Log Book.  Stick patch monitor onto last page of Patient Log Book.   Place Patient Log Book in [place] box.  Use locking tab on box and tape box closed securely.  The [employer] has [employer] on it.  Please place in mailbox as soon as possible.  Your physician should have your  test results approximately 7 days after the monitor has been mailed back to [facility]   Call [place] at [phone] if you have questions regarding your ZIO XT patch monitor.  Call them immediately if you see an orange light blinking on your monitor.   If your monitor falls off in less than 4 days contact our Monitor department at [phone].  If your monitor becomes loose or falls off after 4 days call [employer] at [phone] for suggestions on securing your monitor.     Follow-Up: At [facility], you and your health needs are our priority.  As part of our continuing mission to provide you with exceptional heart care, we have created designated Provider Care Teams.  These Care Teams include your primary Cardiologist (physician) and Advanced Practice Providers (APPs -  Physician Assistants and Nurse Practitioners) who all work together to provide you with the care you need, when you need it.  We recommend signing up for the patient portal called "MyChart".  Sign up information is provided on this After Visit Summary.  MyChart is used to connect with patients for Virtual Visits (Telemedicine).  Patients are able to view lab/test results, encounter notes, upcoming appointments, etc.  Non-urgent messages can be sent to your provider as well.   To learn more about what you can do with MyChart, go to [email or web address].    Your next appointment:   After testing   The format for your next appointment:   In Person  Provider:   You may see Fransico Him, MD or one of the following Advanced Practice Providers on your designated Care Team:    Melina Copa, PA-C  Ermalinda Barrios, PA-C    Other Instructions I would recommend using a blood pressure cuff that goes on your arm. The wrist ones can be inaccurate. If possible, try to select one that also reports your heart rate. To check your blood pressure, choose a time at least 3 hours after taking your blood  pressure medicines. If you can sample it at different times of the day, that's great - it might give you more information about how your blood pressure fluctuates. Remain seated in a chair for 5 minutes quietly beforehand, then check it. Please record a list of those readings and call us/send in MyChart message with them for our review within 3-4 days.   Please remind patient that if she goes into another doctors office that has the capability to do an EKG, please have them obtain one and route to [place] for review

## 2020-06-07 ENCOUNTER — Ambulatory Visit (HOSPITAL_COMMUNITY)
Admission: RE | Admit: 2020-06-07 | Discharge: 2020-06-07 | Disposition: A | Discharge status: Home / Self Care | Payer: Medicare Other | Source: Ambulatory Visit | Attending: Internal Medicine | Admitting: Internal Medicine

## 2020-06-07 ENCOUNTER — Other Ambulatory Visit: Payer: Self-pay

## 2020-06-07 DIAGNOSIS — E1069 Type 1 diabetes mellitus with other specified complication: Secondary | ICD-10-CM | POA: Diagnosis not present

## 2020-06-07 DIAGNOSIS — E08311 Diabetes mellitus due to underlying condition with unspecified diabetic retinopathy with macular edema: Secondary | ICD-10-CM | POA: Diagnosis not present

## 2020-06-07 DIAGNOSIS — E0590 Thyrotoxicosis, unspecified without thyrotoxic crisis or storm: Secondary | ICD-10-CM | POA: Diagnosis not present

## 2020-06-07 DIAGNOSIS — I6523 Occlusion and stenosis of bilateral carotid arteries: Secondary | ICD-10-CM | POA: Diagnosis not present

## 2020-06-07 DIAGNOSIS — I10 Essential (primary) hypertension: Secondary | ICD-10-CM | POA: Diagnosis not present

## 2020-06-07 DIAGNOSIS — R809 Proteinuria, unspecified: Secondary | ICD-10-CM | POA: Diagnosis not present

## 2020-06-07 DIAGNOSIS — E042 Nontoxic multinodular goiter: Secondary | ICD-10-CM | POA: Diagnosis not present

## 2020-06-07 DIAGNOSIS — R9439 Abnormal result of other cardiovascular function study: Secondary | ICD-10-CM | POA: Insufficient documentation

## 2020-06-07 DIAGNOSIS — E139 Other specified diabetes mellitus without complications: Secondary | ICD-10-CM | POA: Diagnosis not present

## 2020-06-07 DIAGNOSIS — I493 Ventricular premature depolarization: Secondary | ICD-10-CM | POA: Diagnosis not present

## 2020-06-07 DIAGNOSIS — Z6835 Body mass index (BMI) 35.0-35.9, adult: Secondary | ICD-10-CM | POA: Diagnosis not present

## 2020-06-09 DIAGNOSIS — I493 Ventricular premature depolarization: Secondary | ICD-10-CM | POA: Diagnosis not present

## 2020-06-13 DIAGNOSIS — N3001 Acute cystitis with hematuria: Secondary | ICD-10-CM | POA: Diagnosis not present

## 2020-06-13 DIAGNOSIS — I160 Hypertensive urgency: Secondary | ICD-10-CM | POA: Diagnosis not present

## 2020-06-14 DIAGNOSIS — I10 Essential (primary) hypertension: Secondary | ICD-10-CM | POA: Diagnosis not present

## 2020-06-14 DIAGNOSIS — I493 Ventricular premature depolarization: Secondary | ICD-10-CM | POA: Diagnosis not present

## 2020-06-14 DIAGNOSIS — H2512 Age-related nuclear cataract, left eye: Secondary | ICD-10-CM | POA: Diagnosis not present

## 2020-06-14 DIAGNOSIS — R9439 Abnormal result of other cardiovascular function study: Secondary | ICD-10-CM | POA: Diagnosis not present

## 2020-06-14 DIAGNOSIS — I6523 Occlusion and stenosis of bilateral carotid arteries: Secondary | ICD-10-CM | POA: Diagnosis not present

## 2020-06-19 ENCOUNTER — Ambulatory Visit (HOSPITAL_COMMUNITY): Payer: Medicare Other | Attending: Physician Assistant

## 2020-06-19 ENCOUNTER — Other Ambulatory Visit: Payer: Medicare Other | Admitting: *Deleted

## 2020-06-19 ENCOUNTER — Other Ambulatory Visit: Payer: Self-pay

## 2020-06-19 DIAGNOSIS — I6523 Occlusion and stenosis of bilateral carotid arteries: Secondary | ICD-10-CM

## 2020-06-19 DIAGNOSIS — I10 Essential (primary) hypertension: Secondary | ICD-10-CM

## 2020-06-19 DIAGNOSIS — I493 Ventricular premature depolarization: Secondary | ICD-10-CM | POA: Diagnosis not present

## 2020-06-19 DIAGNOSIS — R9439 Abnormal result of other cardiovascular function study: Secondary | ICD-10-CM | POA: Diagnosis not present

## 2020-06-19 LAB — LIPID PANEL
Chol/HDL Ratio: 2.7 ratio (ref 0.0–4.4)
Cholesterol, Total: 202 mg/dL — ABNORMAL HIGH (ref 100–199)
HDL: 76 mg/dL (ref >39)
LDL Chol Calc (NIH): 107 mg/dL — ABNORMAL HIGH (ref 0–99)
Triglycerides: 109 mg/dL (ref 0–149)
VLDL Cholesterol Cal: 19 mg/dL (ref 5–40)

## 2020-06-19 LAB — MAGNESIUM: Magnesium: 2.1 mg/dL (ref 1.6–2.3)

## 2020-06-19 LAB — COMPREHENSIVE METABOLIC PANEL
ALT: 7 IU/L (ref 0–32)
AST: 20 IU/L (ref 0–40)
Albumin/Globulin Ratio: 1.3 (ref 1.2–2.2)
Albumin: 3.5 g/dL — ABNORMAL LOW (ref 3.8–4.8)
Alkaline Phosphatase: 94 IU/L (ref 44–121)
BUN/Creatinine Ratio: 14 (ref 12–28)
BUN: 32 mg/dL — ABNORMAL HIGH (ref 8–27)
Bilirubin Total: 0.5 mg/dL (ref 0.0–1.2)
CO2: 25 mmol/L (ref 20–29)
Calcium: 9.3 mg/dL (ref 8.7–10.3)
Chloride: 97 mmol/L (ref 96–106)
Creatinine, Ser: 2.29 mg/dL — ABNORMAL HIGH (ref 0.57–1.00)
Globulin, Total: 2.7 g/dL (ref 1.5–4.5)
Glucose: 179 mg/dL — ABNORMAL HIGH (ref 65–99)
Potassium: 4.3 mmol/L (ref 3.5–5.2)
Sodium: 139 mmol/L (ref 134–144)
Total Protein: 6.2 g/dL (ref 6.0–8.5)
eGFR: 23 mL/min/{1.73_m2} — ABNORMAL LOW (ref >59)

## 2020-06-19 LAB — TSH: TSH: 4.02 u[IU]/mL (ref 0.450–4.500)

## 2020-06-20 ENCOUNTER — Other Ambulatory Visit: Payer: Self-pay | Admitting: Physician Assistant

## 2020-06-20 ENCOUNTER — Telehealth: Payer: Self-pay | Admitting: *Deleted

## 2020-06-20 ENCOUNTER — Encounter: Payer: Self-pay | Admitting: *Deleted

## 2020-06-20 DIAGNOSIS — I472 Ventricular tachycardia, unspecified: Secondary | ICD-10-CM

## 2020-06-20 DIAGNOSIS — I10 Essential (primary) hypertension: Secondary | ICD-10-CM

## 2020-06-20 DIAGNOSIS — E782 Mixed hyperlipidemia: Secondary | ICD-10-CM

## 2020-06-20 DIAGNOSIS — R931 Abnormal findings on diagnostic imaging of heart and coronary circulation: Secondary | ICD-10-CM

## 2020-06-20 DIAGNOSIS — R9431 Abnormal electrocardiogram [ECG] [EKG]: Secondary | ICD-10-CM

## 2020-06-20 DIAGNOSIS — I493 Ventricular premature depolarization: Secondary | ICD-10-CM

## 2020-06-20 DIAGNOSIS — Z8249 Family history of ischemic heart disease and other diseases of the circulatory system: Secondary | ICD-10-CM

## 2020-06-20 DIAGNOSIS — R42 Dizziness and giddiness: Secondary | ICD-10-CM

## 2020-06-20 NOTE — Telephone Encounter (Signed)
As per result note, we planning Lexiscan stress test. I called patient and spoke with her about consent. She is agreeable. I also signed attestation order.  Shared Decision Making/Informed Consent The risks [chest pain, shortness of breath, cardiac arrhythmias, dizziness, blood pressure fluctuations, myocardial infarction, stroke/transient ischemic attack, nausea, vomiting, allergic reaction, radiation exposure, metallic taste sensation and life-threatening complications (estimated to be 1 in 10,000)], benefits (risk stratification, diagnosing coronary artery disease, treatment guidance) and alternatives of a nuclear stress test were discussed in detail with Ms. Korell and she agrees to proceed.  Ms. Harjo also passed on that she appreciated Dorian Pod providing all the information this morning. Will route back to Dorian Pod to let her know attestation was done and that she is ready for scheduling. Please let me know what you hear back from Dr. Adin Hector office. Agree with plan to keep atorvastatin at current dose, take consistently, and recheck labs as planned.  Thanks!

## 2020-06-20 NOTE — Telephone Encounter (Addendum)
Dunn, Dayna N, PA-C  P Cv Div Ch St Triage This is 1 of 2 result (also has echo). Please let pt know electrolytes are normal. But there are a few abnormalities:   1) Creatinine is higher than it was on previous labs (1.73 by [facility] 04/2020, 2.29 on these labs). We were seeing her recently for PVCs but otherwise do not manage her BP medications/spironolactone/chlorthalidone/Farxiga. Can you please reach out to patient's kidney doctor at [facility] to ask them to weigh in on the rise in her creatinine and ongoing continuation of these medicines? She was not sure who the name of the doctor was at our recent [place]. Make sure she knows to avoid NSAIDS (ibuprofen, motrin, aleve, naproxen, etc).   2) LDL is higher than would be desired for her known history of mild carotid disease. If she can confirm she is taking atorvastatin 40mg  daily, would increase to 80mg  daily with recheck CMET/lipid profile in 8 weeks.   3) See result note on echo.    ----- Message from Charlie Pitter, PA-C sent at 06/20/2020 12:19 PM EDT ----- Please see result note on labs as well. Please let pt know echo shows normal pumping function. There is mild thickness of her heart muscle and stiffening which we see with elevated BP so please let us know how her BPs have been running. I discussed her case with Dr. Radford Pax and we'd like to set her up for a nuclear stress test at [facility]. I tried to call her to consent her for this but it went to her voicemail. Please let her know (if you get her) that I will try calling again later today or tomorrow so please answer the phone if it rings from an [phone] (hospital) number.

## 2020-06-20 NOTE — Telephone Encounter (Signed)
Spoke to patient about results from her labs and echo.  Creatinine was elevated from last lab work and we discussed her kidney doctor and who she saw.  Patient states she see's Dr. Pearson Grippe and she has an appointment sometime in September.  We talked about sharing her lab work with the kidney doctor and getting his input on elevated creatinine and medications and getting an earlier appointment to see them.  Patient verbalized understanding.  Placed a call to [facility] and left voicemail message for Dr.'s Joelyn Oms nurse that patient needs sooner appointment regarding recent creatinine and medications.  Forwarding labs to Dr. Joelyn Oms.  Spoke to patient about her atorvastatin and she hasn't been taking it regularly because she forgets.  She is going to try and take 40 mg tablet regularly and we will recheck labs in 8 weeks.  Appointment made.  Stress order placed and patient is aware that she will be called to schedule this, instruction letter sent to patient in mychart.

## 2020-06-21 ENCOUNTER — Telehealth (HOSPITAL_COMMUNITY): Payer: Self-pay | Admitting: *Deleted

## 2020-06-21 ENCOUNTER — Other Ambulatory Visit: Payer: Self-pay | Admitting: Physician Assistant

## 2020-06-21 DIAGNOSIS — R42 Dizziness and giddiness: Secondary | ICD-10-CM

## 2020-06-21 DIAGNOSIS — R9431 Abnormal electrocardiogram [ECG] [EKG]: Secondary | ICD-10-CM

## 2020-06-21 DIAGNOSIS — I493 Ventricular premature depolarization: Secondary | ICD-10-CM

## 2020-06-21 DIAGNOSIS — Z8249 Family history of ischemic heart disease and other diseases of the circulatory system: Secondary | ICD-10-CM

## 2020-06-21 DIAGNOSIS — I10 Essential (primary) hypertension: Secondary | ICD-10-CM

## 2020-06-21 NOTE — Telephone Encounter (Signed)
Left message on voicemail per DPR in reference to upcoming appointment scheduled on 06/25/20 at 7:30 with detailed instructions given per Myocardial Perfusion Study Information Sheet for the test. LM to arrive 15 minutes early, and that it is imperative to arrive on time for appointment to keep from having the test rescheduled. If you need to cancel or reschedule your appointment, please call the office within 24 hours of your appointment. Failure to do so may result in a cancellation of your appointment, and a $50 no show fee. Phone number given for call back for any questions.

## 2020-06-21 NOTE — Progress Notes (Signed)
Encounter addended for order association to Ventricular Tachycardia per Melina Copa, PA.

## 2020-06-25 ENCOUNTER — Other Ambulatory Visit: Payer: Self-pay

## 2020-06-25 ENCOUNTER — Ambulatory Visit (HOSPITAL_COMMUNITY): Payer: Medicare Other | Attending: Cardiology

## 2020-06-25 DIAGNOSIS — I10 Essential (primary) hypertension: Secondary | ICD-10-CM | POA: Diagnosis not present

## 2020-06-25 DIAGNOSIS — I472 Ventricular tachycardia, unspecified: Secondary | ICD-10-CM

## 2020-06-25 DIAGNOSIS — E782 Mixed hyperlipidemia: Secondary | ICD-10-CM | POA: Diagnosis not present

## 2020-06-25 DIAGNOSIS — R9431 Abnormal electrocardiogram [ECG] [EKG]: Secondary | ICD-10-CM | POA: Diagnosis not present

## 2020-06-25 DIAGNOSIS — Z8249 Family history of ischemic heart disease and other diseases of the circulatory system: Secondary | ICD-10-CM | POA: Diagnosis not present

## 2020-06-25 DIAGNOSIS — R42 Dizziness and giddiness: Secondary | ICD-10-CM | POA: Insufficient documentation

## 2020-06-25 LAB — MYOCARDIAL PERFUSION IMAGING
LV dias vol: 114 mL (ref 46–106)
LV sys vol: 45 mL
Peak HR: 81 {beats}/min
Rest HR: 69 {beats}/min
SDS: 2
SRS: 7
SSS: 9
TID: 1.07

## 2020-06-25 MED ORDER — REGADENOSON 0.4 MG/5ML IV SOLN
0.4000 mg | Freq: Once | INTRAVENOUS | Status: AC
  Administered 2020-06-25: 0.4 mg via INTRAVENOUS

## 2020-06-25 MED ORDER — TECHNETIUM TC 99M TETROFOSMIN IV KIT
31.6000 | PACK | Freq: Once | INTRAVENOUS | Status: AC | PRN
Start: 2020-06-25 — End: 2020-06-25
  Administered 2020-06-25: 31.6 via INTRAVENOUS
  Filled 2020-06-25: qty 32

## 2020-06-25 MED ORDER — TECHNETIUM TC 99M TETROFOSMIN IV KIT
11.0000 | PACK | Freq: Once | INTRAVENOUS | Status: AC | PRN
  Administered 2020-06-25: 11 via INTRAVENOUS
  Filled 2020-06-25: qty 11

## 2020-06-26 DIAGNOSIS — H401131 Primary open-angle glaucoma, bilateral, mild stage: Secondary | ICD-10-CM | POA: Diagnosis not present

## 2020-06-26 DIAGNOSIS — H259 Unspecified age-related cataract: Secondary | ICD-10-CM | POA: Diagnosis not present

## 2020-06-26 DIAGNOSIS — H268 Other specified cataract: Secondary | ICD-10-CM | POA: Diagnosis not present

## 2020-06-28 DIAGNOSIS — R809 Proteinuria, unspecified: Secondary | ICD-10-CM | POA: Diagnosis not present

## 2020-06-29 DIAGNOSIS — H353132 Nonexudative age-related macular degeneration, bilateral, intermediate dry stage: Secondary | ICD-10-CM | POA: Diagnosis not present

## 2020-06-29 DIAGNOSIS — H43813 Vitreous degeneration, bilateral: Secondary | ICD-10-CM | POA: Diagnosis not present

## 2020-06-29 DIAGNOSIS — H348312 Tributary (branch) retinal vein occlusion, right eye, stable: Secondary | ICD-10-CM | POA: Diagnosis not present

## 2020-06-29 DIAGNOSIS — E113313 Type 2 diabetes mellitus with moderate nonproliferative diabetic retinopathy with macular edema, bilateral: Secondary | ICD-10-CM | POA: Diagnosis not present

## 2020-07-11 ENCOUNTER — Telehealth: Payer: Self-pay | Admitting: *Deleted

## 2020-07-11 ENCOUNTER — Other Ambulatory Visit: Payer: Self-pay

## 2020-07-11 ENCOUNTER — Encounter: Payer: Self-pay | Admitting: Cardiology

## 2020-07-11 ENCOUNTER — Telehealth (INDEPENDENT_AMBULATORY_CARE_PROVIDER_SITE_OTHER): Payer: Medicare Other | Admitting: Cardiology

## 2020-07-11 VITALS — BP 169/77 | HR 72 | Ht 63.0 in | Wt 203.0 lb

## 2020-07-11 DIAGNOSIS — G4733 Obstructive sleep apnea (adult) (pediatric): Secondary | ICD-10-CM | POA: Diagnosis not present

## 2020-07-11 DIAGNOSIS — K219 Gastro-esophageal reflux disease without esophagitis: Secondary | ICD-10-CM | POA: Diagnosis not present

## 2020-07-11 DIAGNOSIS — I119 Hypertensive heart disease without heart failure: Secondary | ICD-10-CM | POA: Diagnosis not present

## 2020-07-11 DIAGNOSIS — E669 Obesity, unspecified: Secondary | ICD-10-CM

## 2020-07-11 DIAGNOSIS — I6523 Occlusion and stenosis of bilateral carotid arteries: Secondary | ICD-10-CM

## 2020-07-11 NOTE — Patient Instructions (Signed)

## 2020-07-11 NOTE — Telephone Encounter (Signed)
-----   Message from Sueanne Margarita, MD sent at 07/11/2020 10:46 AM EDT ----- -I will add a chin strap to help with dry mouth

## 2020-07-11 NOTE — Progress Notes (Signed)
Virtual Visit via Telephone Note   This visit type was conducted due to national recommendations for restrictions regarding the COVID-19 Pandemic (e.g. social distancing) in an effort to limit this patient's exposure and mitigate transmission in our community.  Due to her co-morbid illnesses, this patient is at least at moderate risk for complications without adequate follow up.  This format is felt to be most appropriate for this patient at this time.  All issues noted in this document were discussed and addressed.  A limited physical exam was performed with this format.  Please refer to the patient's chart for her consent to telehealth for [facility].   Evaluation Performed:  Follow-up visit  This visit type was conducted due to national recommendations for restrictions regarding the COVID-19 Pandemic (e.g. social distancing).  This format is felt to be most appropriate for this patient at this time.  All issues noted in this document were discussed and addressed.  No physical exam was performed (except for noted visual exam findings with Video Visits).  Please refer to the patient's chart (MyChart message for video visits and phone note for telephone visits) for the patient's consent to telehealth for [facility].  Date:  07/11/2020   ID:  Nannette, Zill [birth year], MRN [id number]  Patient Location:  Home  Provider location:   [place]  PCP:  Kristen Loader, FNP  Cardiologist: Dr. Fransico Him Electrophysiologist:  None   Chief Complaint:  OSA  History of Present Illness:    Kathleen [name] is a 70 y.o. female who presents via audio/video conferencing for a telehealth visit today.      Kathleen [name] is a [phone]. female who presents for followup of OSA. She is doing well with her CPAP device and thinks that she has gotten used to it.  She tolerates the nasal pillow mask and feels the pressure is adequate.  Since going on CPAP she feels rested in the am and has no  significant daytime sleepiness if she has slept the night before. Sometimes she has a problem getting to sleep and this has gotten worse since starting the steroid eye gtts. She does complain of mouth dryness but related that to taking [place].  She denies any  nasal dryness or nasal congestion.  She does not think that he snores.    The patient does not have symptoms concerning for COVID-19 infection (fever, chills, cough, or new shortness of breath).   Prior CV studies:   The following studies were reviewed today:  PAP download  Past Medical History:  Diagnosis Date  . Carotid artery plaque    mild to moderate less than 50% stenosis on u/s 2/14  . CKD (chronic kidney disease), stage III ([place])   . Complication of anesthesia    pt states stopped breathing when having tonsillectomy at age 71; pt states has had anesthesia since then without difficulty   . Diabetes mellitus   . Glaucoma   . Heart disease   . Hyperlipidemia   . Hypertension   . Hyperthyroidism   . Menopause   . Mixed hyperlipidemia 09/22/2013  . Morbid obesity ([place])   . Multinodular goiter    AND HYPERTHYROIDISM   . Obesity (BMI 30-39.9) 04/30/2015  . OSA on CPAP   . Proliferative retinopathy    moderate, Dr. Posey Pronto  . Proteinuria    Dr. Hassell Done  . Pulmonary hypertension ([place])   . PVC's (premature ventricular contractions)   . Right-sided heart failure ([place])   .  Sinus bradycardia   . Trigger finger    Dr. Rip Harbour   Past Surgical History:  Procedure Laterality Date  . GASTRIC BANDING PORT REVISION N/A 02/27/2015   Procedure: GASTRIC BANDING PORT REVISION;  Surgeon: Johnathan Hausen, MD;  Location: [facility];  Service: General;  Laterality: N/A;  . LAPAROSCOPIC GASTRIC BANDING  2010  . NERVE SURGERY  2001  . SPLIT NIGHT STUDY  08/03/2015  . TONSILLECTOMY  1958     Current Meds  Medication Sig  . amLODipine (NORVASC) 10 MG tablet Take 10 mg by mouth daily.  Marland Kitchen atorvastatin (LIPITOR) 40 MG tablet Take 40 mg by mouth  daily.  . carboxymethylcellulose (REFRESH PLUS) 0.5 % SOLN Place 1 drop into both eyes 3 (three) times daily as needed (dry eyes).  . chlorthalidone (HYGROTON) 25 MG tablet Take 25 mg by mouth daily.  . dapagliflozin propanediol (FARXIGA) 10 MG TABS tablet Take 10 mg by mouth daily.  Mariane Baumgarten Sodium (COLACE PO) Take by mouth.  Marland Kitchen LEVEMIR FLEXPEN 100 UNIT/ML SOPN Inject 10-20 Units into the skin daily at 10 pm. If blood sugar > 100 take 20 units, but takes less if lower than 100  . methimazole (TAPAZOLE) 10 MG tablet Take 5 mg by mouth daily.   . Multiple Vitamin (MULTIVITAMIN) capsule Take 1 capsule by mouth daily.  Marland Kitchen NOVOLOG FLEXPEN 100 UNIT/ML injection Inject 0-18 Units into the skin 3 (three) times daily with meals. Based on sliding scale  . OMEPRAZOLE PO Take 20 mg by mouth daily.  . ONE TOUCH ULTRA TEST test strip   . spironolactone (ALDACTONE) 25 MG tablet Take 25 mg by mouth daily.      Allergies:   Ace inhibitors, Benicar [olmesartan], Epinephrine hcl, and Levaquin [levofloxacin in d5w]   Social History   Tobacco Use  . Smoking status: Former Smoker    Packs/day: 1.00    Years: 30.00    Pack years: 30.00    Types: Cigarettes    Quit date: 03/04/2007    Years since quitting: 13.3  . Smokeless tobacco: Never Used  Vaping Use  . Vaping Use: Never used  Substance Use Topics  . Alcohol use: Yes    Alcohol/week: 0.0 standard drinks    Comment: occas  . Drug use: No     Family Hx: The patient's family history includes Heart attack in her father.  ROS:   Please see the history of present illness.      All other systems reviewed and are negative.   Labs/Other Tests and Data Reviewed:    Recent Labs: 06/19/2020: ALT 7; BUN 32; Creatinine, Ser 2.29; Magnesium 2.1; Potassium 4.3; Sodium 139; TSH 4.020   Recent Lipid Panel Lab Results  Component Value Date/Time   CHOL 202 (H) 06/19/2020 07:51 AM   TRIG 109 06/19/2020 07:51 AM   HDL 76 06/19/2020 07:51 AM   CHOLHDL  2.7 06/19/2020 07:51 AM   CHOLHDL 3.1 02/11/2007 07:45 AM   LDLCALC 107 (H) 06/19/2020 07:51 AM    Wt Readings from Last 3 Encounters:  07/11/20 203 lb (92.1 kg)  06/25/20 192 lb (87.1 kg)  06/06/20 192 lb (87.1 kg)     Objective:    Vital Signs:  BP (!) 169/77   Pulse 72   Ht 5\' 3"  (1.6 m)   Wt 203 lb (92.1 kg)   BMI 35.96 kg/m    Well nourished, well developed female in no acute distress. Well appearing, alert and conversant, regular work of breathing,  good skin color  Eyes- anicteric mouth- oral mucosa is pink  neuro- grossly intact skin- no apparent rash or lesions or cyanosis   ASSESSMENT & PLAN:    1. OSA - The patient is tolerating PAP therapy well without any problems. The PAP download was reviewed today and showed an AHI of 1.8/hr on 9 cm H2O with 100% compliance in using more than 4 hours  -I will add a chin strap to help with dry mouth  2.  Hypertension -Her BP is elevated today -She will continue on amlodipine 10mg  daily, chlorthalidone 25mg  daily and spironolactone 25mg  daily -her renal function has increased over the past years and is seeing a nephrologist -I reviewed her labs from her PCP on 06/19/2020 which showed a SCr of 2.29 (the last one I have was 1.67 from 2018)>>her nephrologist has been following this -she recently had cataract surgery and has been taking steroid eye gtts and since she started that her BP has been running higher -I have asked her to contact her nephrologist about her elevated BP since she is also having HAs in the am with the elevated BPs -I will send him a note regarding  3. Obesity  -she has gained 11lbs over the past year due to COVID 19  -I have encouraged her to get into a routine exercise program and cut back on carbs and portions.   4.  GERD -she is having a lot of reflux problems at night an a lot of mucous issues about an hour after she goes to bed -I have recommended that she followup with her PCP to discuss this  further  COVID-19 Education: The signs and symptoms of COVID-19 were discussed with the patient and how to seek care for testing (follow up with PCP or arrange E-visit).  The importance of social distancing was discussed today.  Patient Risk:   After full review of this patient's clinical status, I feel that they are at least moderate risk at this time.  Time:   Today, I have spent 15 minutes directly with the patient on telephone including OSA, obesity and HTN.  We also reviewed the symptoms of COVID 19 and the ways to protect against contracting the virus with telehealth technology.  I spent an additional 10 minutes reviewing patient's chart including PAP compliance report and sending correspondance to PCP and nephrology regarding her BP and GERD sx.  Medication Adjustments/Labs and Tests Ordered: Current medicines are reviewed at length with the patient today.  Concerns regarding medicines are outlined above.  Tests Ordered: No orders of the defined types were placed in this encounter.  Medication Changes: No orders of the defined types were placed in this encounter.   Disposition:  Follow up in 1 year(s)  Signed, Fransico Him, MD  07/11/2020 10:47 AM    [facility]

## 2020-07-11 NOTE — Telephone Encounter (Signed)
Order placed to [facility] via fax

## 2020-07-12 ENCOUNTER — Other Ambulatory Visit: Payer: Medicare Other

## 2020-07-12 ENCOUNTER — Other Ambulatory Visit (HOSPITAL_COMMUNITY): Payer: Medicare Other

## 2020-07-17 ENCOUNTER — Encounter: Payer: Self-pay | Admitting: Physician Assistant

## 2020-07-17 NOTE — Progress Notes (Signed)
Cardiology Office Note    Date:  07/18/2020   ID:  Kathleen, [name] 1950/08/03, MRN [id number]  PCP:  Kristen Loader, FNP  Cardiologist: Remotely Dr. Irish Lack, more recently Dr. Fransico Him, MD for sleep Electrophysiologist:  None   Chief Complaint: f/u PVCs  History of Present Illness:   Kathleen [name] is a 70 y.o. female with history of OSA, obesity s/p lap band surgery, HTN, mild bilateral carotid artery plaque (1-39% 06/2020), frequent PVCs, sinus bradycardia, CKD stage IV, DM, HLD, hyperthyroidism (followed by endocrinology), obesity, proteinuria, right heart failure in 2008 and prior abnormal stress test who presents for follow-up of PVCs.  She saw Dr. Irish Lack many years remotely up until 2018 and more recently she's been followed by Dr. Radford Pax for OSA. Per chart review she has a history of admission in 2008 for severe SOB with DC summary outlining right heart failure requiring diuresis (20lb), severe OSA, and COPD. No echo available for review from that time. She states that this issue resolved after she had lap band surgery for obesity. She also had a stress test in 2008 showing a reversible anterior wall defect but with reduced specificity and sensitivity due to obesity and slight difference in breast positioning during the study - overall felt low risk, EF 69%, unremarkable pharmacologic stress test per report. Dr. Irish Lack had recommended medical management. He followed her for HTN and PVCs, stating she was not on a BB due to baseline bradycardia. Event monitor 2010 showed NSR with PVCs. She had frequent PVCs demonstrated on EKGs in 2015 and 2016. EKG in 2018 showed NSR with occasional PACs with possible QT prolongation but hand calculation closer to 448ms. Father had heart blockages starting at age 31. More recently she's followed with Dr. Radford Pax in the context of her OSA.  She was recently seen back for evaluation of PVCs noted during cataract surgery, totally asymptomatic.  Repeat event monitor 06/2020 showed average HR 64, range 46-104, frequent PVCs (5%), bigeminal/trigeminal PVCs, couplets, triplets and several runs of NSVT up to 19 beats in a row, frequent PACs, PAT. 2D Echo showed EF 55-60%, moderate LVH, grade 2 DD, moderate LAE, mild MR. I reviewed with Dr. Radford Pax who recommended nuclear stress test which showed attenuation artifact, normal LVEF, no ischemia noted. She saw Dr. Radford Pax in follow-up for OSA at which time BP was also elevated - Dr. Radford Pax recommended she contact nephrologist to discuss (Dr. Joelyn Oms).  She is seen for follow-up today overall doing well. She remains completely asymptomatic. No chest pain, dyspnea, palpitations, near-syncope or syncope. Her BP remains high. She reports this is actually improved from prior as she was frequently seeing values in the 180s while on steroids. Historically she reports her BP has hovered around 150. She had not yet touched base with Dr. Joelyn Oms about her elevated readings. She does report she heard back from him after the last kidney function check, stating that he had reviewed the most recent kidney function which was felt to be consistent with prior values.   Labwork independently reviewed: 06/2020 LDL 107, MG 2.1, TSH wnl, K 4.3, Cr 2.29, LFTs OK KPN 04/2020 BUN 23, Cr 1.73, Hgb 13.6         2019 LDL 98 (PCP), K 4.9   Past Medical History:  Diagnosis Date  . Carotid artery plaque    duplex 07/2020 1-39% BICA  . CKD (chronic kidney disease), stage IV ([place])   . Complication of anesthesia    pt states  stopped breathing when having tonsillectomy at age 20; pt states has had anesthesia since then without difficulty   . Diabetes mellitus   . Glaucoma   . Heart disease   . Hyperlipidemia   . Hypertension   . Hyperthyroidism   . Menopause   . Mixed hyperlipidemia 09/22/2013  . Morbid obesity ([place])   . Multinodular goiter    AND HYPERTHYROIDISM   . Obesity (BMI 30-39.9) 04/30/2015  . OSA on CPAP   .  Proliferative retinopathy    moderate, Dr. Posey Pronto  . Proteinuria    Dr. Hassell Done  . Pulmonary hypertension ([place])   . PVC's (premature ventricular contractions)   . Right-sided heart failure ([place])   . Sinus bradycardia   . Trigger finger    Dr. Rip Harbour    Past Surgical History:  Procedure Laterality Date  . GASTRIC BANDING PORT REVISION N/A 02/27/2015   Procedure: GASTRIC BANDING PORT REVISION;  Surgeon: Johnathan Hausen, MD;  Location: [facility];  Service: General;  Laterality: N/A;  . LAPAROSCOPIC GASTRIC BANDING  2010  . NERVE SURGERY  2001  . SPLIT NIGHT STUDY  08/03/2015  . TONSILLECTOMY  1958    Current Medications: Current Meds  Medication Sig  . amLODipine (NORVASC) 10 MG tablet Take 10 mg by mouth daily.  Marland Kitchen atorvastatin (LIPITOR) 40 MG tablet Take 40 mg by mouth daily.  . carboxymethylcellulose (REFRESH PLUS) 0.5 % SOLN Place 1 drop into both eyes 3 (three) times daily as needed (dry eyes).  . chlorthalidone (HYGROTON) 25 MG tablet Take 25 mg by mouth daily.  . dapagliflozin propanediol (FARXIGA) 10 MG TABS tablet Take 10 mg by mouth daily.  Mariane Baumgarten Sodium (COLACE PO) Take by mouth.  Marland Kitchen LEVEMIR FLEXPEN 100 UNIT/ML SOPN Inject 10-20 Units into the skin daily at 10 pm. If blood sugar > 100 take 20 units, but takes less if lower than 100  . methimazole (TAPAZOLE) 10 MG tablet Take 5 mg by mouth daily.   . Multiple Vitamin (MULTIVITAMIN) capsule Take 1 capsule by mouth daily.  Marland Kitchen NOVOLOG FLEXPEN 100 UNIT/ML injection Inject 0-18 Units into the skin 3 (three) times daily with meals. Based on sliding scale  . OMEPRAZOLE PO Take 20 mg by mouth daily.  . ONE TOUCH ULTRA TEST test strip   . spironolactone (ALDACTONE) 25 MG tablet Take 25 mg by mouth daily.     Allergies:   Ace inhibitors, Benicar [olmesartan], Epinephrine hcl, and Levaquin [levofloxacin in d5w]   Social History   Socioeconomic History  . Marital status: Single    Spouse name: Not on file  . Number of children: Not  on file  . Years of education: Not on file  . Highest education level: Not on file  Occupational History  . Not on file  Tobacco Use  . Smoking status: Former Smoker    Packs/day: 1.00    Years: 30.00    Pack years: 30.00    Types: Cigarettes    Quit date: 03/04/2007    Years since quitting: 13.3  . Smokeless tobacco: Never Used  Vaping Use  . Vaping Use: Never used  Substance and Sexual Activity  . Alcohol use: Yes    Alcohol/week: 0.0 standard drinks    Comment: occas  . Drug use: No  . Sexual activity: Not on file  Other Topics Concern  . Not on file  Social History Narrative  . Not on file   Social Determinants of Health   Financial Resource Strain:  Not on file  Food Insecurity: Not on file  Transportation Needs: Not on file  Physical Activity: Not on file  Stress: Not on file  Social Connections: Not on file     Family History:  The patient's family history includes Heart attack in her father.  ROS:   Please see the history of present illness.  All other systems are reviewed and otherwise negative.    EKGs/Labs/Other Studies Reviewed:    Studies reviewed are outlined and summarized above. Reports included below if pertinent.  Stress test scan 2008  Carotid US 2014 IMPRESSION:   Mild - moderate heterogeneous calcified and fibrofatty atheromatous  plaque results in less than 50% diameter stenosis bilaterally.   There has been only mild subjective progression of plaque compared  to 01/15/2007.   NST 06/2020  Nuclear stress EF: 61%.  There was no ST segment deviation noted during stress.  There is a small defect of moderate severity present in the apex location. The defect is non-reversible. In the setting of normal LVF, this is consistent with attenuation artifact. No ischemia noted.  This is a low risk study.  The left ventricular ejection fraction is normal (55-65%).  2d echo 06/2020 1. Left ventricular ejection fraction, by estimation, is 55  to 60%. The  left ventricle has normal function. The left ventricle has no regional  wall motion abnormalities. There is moderate concentric left ventricular  hypertrophy. Left ventricular  diastolic parameters are consistent with Grade II diastolic dysfunction  (pseudonormalization).  2. Right ventricular systolic function was not well visualized. The right  ventricular size is normal.  3. Left atrial size was moderately dilated.  4. The mitral valve is grossly normal. Mild mitral valve regurgitation.  5. The aortic valve is tricuspid. Aortic valve regurgitation is not  visualized.   Monitor 06/2020  Predominant rhythm was normal sinus rhythm with an average heart rate of 64bpm and ranged from 46-104bpm.  Frequent PVCs, bigeminal PVCs, trigeminal PVCs, ventricular couplets and triplets and several runs of nonsustained ventricular tachycardia up to 19 beats in a row with highest heart rate 203bpm.  Frequent PACs and nonsustained atrial tachycardia up to 22 beats in a row as fast as 171bpm.  Carotid US 06/2020 Summary:  Right Carotid: Velocities in the right ICA are consistent with a 1-39%  stenosis.   Left Carotid: Velocities in the left ICA are consistent with a 1-39%  stenosis.   Vertebrals: Bilateral vertebral arteries demonstrate antegrade flow.  Subclavians: Normal flow hemodynamics were seen in bilateral subclavian        arteries.   *See table(s) above for measurements and observations.      EKG:  EKG is ordered today, personally reviewed, demonstrating NSR 62bpm, decreased R wave progression, QTc wnl, no acute STT changes  Recent Labs: 06/19/2020: ALT 7; BUN 32; Creatinine, Ser 2.29; Magnesium 2.1; Potassium 4.3; Sodium 139; TSH 4.020  Recent Lipid Panel    Component Value Date/Time   CHOL 202 (H) 06/19/2020 0751   TRIG 109 06/19/2020 0751   HDL 76 06/19/2020 0751   CHOLHDL 2.7 06/19/2020 0751   CHOLHDL 3.1 02/11/2007 0745   VLDL 12 02/11/2007 0745    LDLCALC 107 (H) 06/19/2020 0751    PHYSICAL EXAM:    VS:  BP (!) 160/80   Pulse 62   Ht 5\' 3"  (1.6 m)   Wt 206 lb 6.4 oz (93.6 kg)   SpO2 93%   BMI 36.56 kg/m   BMI: Body mass index is  36.56 kg/m.  GEN: Well nourished, well developed female in no acute distress HEENT: normocephalic, atraumatic Neck: no JVD, carotid bruits, or masses Cardiac: RRR; no murmurs, rubs, or gallops, minimal edema  Respiratory:  clear to auscultation bilaterally, normal work of breathing GI: soft, nontender, nondistended, + BS MS: no deformity or atrophy Skin: warm and dry, no rash Neuro:  Alert and Oriented x 3, Strength and sensation are intact, follows commands Psych: euthymic mood, full affect  Wt Readings from Last 3 Encounters:  07/18/20 206 lb 6.4 oz (93.6 kg)  07/11/20 203 lb (92.1 kg)  06/25/20 192 lb (87.1 kg)     ASSESSMENT & PLAN:   1. Frequent PVCs/NSVT - event monitor reviewed with patient. She is completely asymptomatic. 2D echo, lytes and stress test were reassuring. She was previously not on BB due to sinus bradycardia but her average HR by monitor was in the 60s. She did have some HR in the mid 40s but these were predominantly nocturnal.We will initiate a trial of carvedilol 3.125mg  BID to start. Will repeat a 3 day Zio after BB to reassess response. If still having PVCs/NSVT at that time, will plan to refer to EP.  2. Mild carotid artery disease with HLD goal LDL <70 - 1-39% stenosis by duplex 06/2020. Continue statin. Consider reassessment of stenosis in 1-2 years.  3. Hypertension - patient historically has uncontrolled BP. We discussed that even though a number of 150-160 may be normal for her, it is not healthy for her kidneys and heart long term. Dr. Radford Pax previously suggested that patient discuss regimen with her nephrologist. She has not yet done so. She will follow her BP on the newly initiated carvedilol. I told her that if her SBP remains over 161 systolic on the carvedilol  to notify Dr. Joelyn Oms.  4. CKD stage IV - last labs called to nephrology per phone note. She does report she heard back from him after the last kidney function check, stating that he had reviewed the most recent kidney function which was felt to be consistent with prior values. Will defer management of diuretics to his team.  5. Remote right heart failure - in context of pulmonary stressors remotely - RV function not well visualized on recent echo but normal RV size. Continue to follow clinically. She reports episodic history of edema when out and about on her feet. Diuretics managed by nephrology in context of CKD.  Disposition: F/u in 3-4 months - patient prefers to get back on track with Dr. Irish Lack from cardiac standpoint, and keep Dr. Radford Pax for sleep apnea management separately.  Medication Adjustments/Labs and Tests Ordered: Current medicines are reviewed at length with the patient today.  Concerns regarding medicines are outlined above. Medication changes, Labs and Tests ordered today are summarized above and listed in the Patient Instructions accessible in Encounters.   Signed, Charlie Pitter, PA-C  07/18/2020 11:56 AM    [address], [place], [place]  [address] Phone: [phone]; Fax: [phone]

## 2020-07-18 ENCOUNTER — Ambulatory Visit (INDEPENDENT_AMBULATORY_CARE_PROVIDER_SITE_OTHER): Payer: Medicare Other

## 2020-07-18 ENCOUNTER — Other Ambulatory Visit: Payer: Self-pay

## 2020-07-18 ENCOUNTER — Ambulatory Visit (INDEPENDENT_AMBULATORY_CARE_PROVIDER_SITE_OTHER): Payer: Medicare Other | Admitting: Physician Assistant

## 2020-07-18 ENCOUNTER — Encounter: Payer: Self-pay | Admitting: Physician Assistant

## 2020-07-18 VITALS — BP 160/80 | HR 62 | Ht 63.0 in | Wt 206.4 lb

## 2020-07-18 DIAGNOSIS — N184 Chronic kidney disease, stage 4 (severe): Secondary | ICD-10-CM | POA: Diagnosis not present

## 2020-07-18 DIAGNOSIS — I50810 Right heart failure, unspecified: Secondary | ICD-10-CM

## 2020-07-18 DIAGNOSIS — I779 Disorder of arteries and arterioles, unspecified: Secondary | ICD-10-CM

## 2020-07-18 DIAGNOSIS — E785 Hyperlipidemia, unspecified: Secondary | ICD-10-CM

## 2020-07-18 DIAGNOSIS — I6523 Occlusion and stenosis of bilateral carotid arteries: Secondary | ICD-10-CM | POA: Diagnosis not present

## 2020-07-18 DIAGNOSIS — I493 Ventricular premature depolarization: Secondary | ICD-10-CM

## 2020-07-18 DIAGNOSIS — I10 Essential (primary) hypertension: Secondary | ICD-10-CM

## 2020-07-18 MED ORDER — CARVEDILOL 3.125 MG PO TABS: 3.1250 mg | ORAL_TABLET | Freq: Two times a day (BID) | ORAL | 3 refills | Status: DC

## 2020-07-18 NOTE — Patient Instructions (Signed)
Medication Instructions:  Your physician has recommended you make the following change in your medication:   1. Start Carvedilol one tablet by mouth ( 3.125 mg) twice daily, sent in # 90 to requested pharmacy.   *If you need a refill on your cardiac medications before your next appointment, please call your pharmacy*   Lab Work: Keep lab appointment.    If you have labs (blood work) drawn today and your tests are completely normal, you will receive your results only by: Marland Kitchen MyChart Message (if you have MyChart) OR . A paper copy in the mail If you have any lab test that is abnormal or we need to change your treatment, we will call you to review the results.   Testing/Procedures: [facility] [place]- Long Term Monitor Instructions   Your physician has requested you wear a ZIO patch monitor for __3_ days.  This is a single patch monitor.   [employer] supplies one patch monitor per enrollment. Additional stickers are not available. Please do not apply patch if you will be having a Nuclear Stress Test, Echocardiogram, Cardiac CT, MRI, or Chest Xray during the period you would be wearing the monitor. The patch cannot be worn during these tests. You cannot remove and re-apply the ZIO XT patch monitor.  Your ZIO patch monitor will be sent Fed Ex from [employer] directly to your home address. It may take 3-5 days to receive your monitor after you have been enrolled.  Once you have received your monitor, please review the enclosed instructions. Your monitor has already been registered assigning a specific monitor serial # to you.  Billing and Patient Assistance Program Information   We have supplied [employer] with any of your insurance information on file for billing purposes. [employer] offers a sliding scale Patient Assistance Program for patients that do not have insurance, or whose insurance does not completely cover the cost of the ZIO monitor.   You must apply for the Patient Assistance Program to  qualify for this discounted rate.     To apply, please call [employer] at [phone], select option 4, then select option 2, and ask to apply for Patient Assistance Program.  Theodore Demark will ask your household income, and how many people are in your household.  They will quote your out-of-pocket cost based on that information.  [employer] will also be able to set up a 27-month, interest-free payment plan if needed.  Applying the monitor   Shave hair from upper left chest.  Hold abrader disc by orange tab. Rub abrader in 40 strokes over the upper left chest as indicated in your monitor instructions.  Clean area with 4 enclosed alcohol pads. Let dry.  Apply patch as indicated in monitor instructions. Patch will be placed under collarbone on left side of chest with arrow pointing upward.  Rub patch adhesive wings for 2 minutes. Remove white label marked "1". Remove the white label marked "2". Rub patch adhesive wings for 2 additional minutes.  While looking in a mirror, press and release button in center of patch. A small green light will flash 3-4 times. This will be your only indicator that the monitor has been turned on. ?  Do not shower for the first 24 hours. You may shower after the first 24 hours.  Press the button if you feel a symptom. You will hear a small click. Record Date, Time and Symptom in the Patient Logbook.  When you are ready to remove the patch, follow instructions on the last 2  pages of the Patient Logbook. Stick patch monitor onto the last page of Patient Logbook.  Place Patient Logbook in the blue and white box.  Use locking tab on box and tape box closed securely.  The blue and white box has prepaid postage on it. Please place it in the mailbox as soon as possible. Your physician should have your test results approximately 7 days after the monitor has been mailed back to [facility].  Call [facility] at [phone] if you have questions regarding your ZIO XT patch  monitor. Call them immediately if you see an orange light blinking on your monitor.  If your monitor falls off in less than 4 days, contact our Monitor department at [phone]. ?If your monitor becomes loose or falls off after 4 days call [employer] at [phone] for suggestions on securing your monitor.?    Follow-Up: At [facility], you and your health needs are our priority.  As part of our continuing mission to provide you with exceptional heart care, we have created designated Provider Care Teams.  These Care Teams include your primary Cardiologist (physician) and Advanced Practice Providers (APPs -  Physician Assistants and Nurse Practitioners) who all work together to provide you with the care you need, when you need it.  We recommend signing up for the patient portal called "MyChart".  Sign up information is provided on this After Visit Summary.  MyChart is used to connect with patients for Virtual Visits (Telemedicine).  Patients are able to view lab/test results, encounter notes, upcoming appointments, etc.  Non-urgent messages can be sent to your provider as well.   To learn more about what you can do with MyChart, go to [email or web address].    Your next appointment:   4 month(s)  The format for your next appointment:   In Person  Provider:   Casandra Doffing, MD on  Wednesday, August 24 @ 9:00 am.    Other Instructions If you blood pressure remains over 140 on the top ( systolic) number even with the new medication Coreg, Call Dr. Adin Hector office.

## 2020-07-18 NOTE — Progress Notes (Unsigned)
Patient enrolled for [employer] to mail a 3 day ZIO XT to her home.

## 2020-07-25 DIAGNOSIS — I493 Ventricular premature depolarization: Secondary | ICD-10-CM | POA: Diagnosis not present

## 2020-08-06 DIAGNOSIS — I779 Disorder of arteries and arterioles, unspecified: Secondary | ICD-10-CM | POA: Diagnosis not present

## 2020-08-06 DIAGNOSIS — I10 Essential (primary) hypertension: Secondary | ICD-10-CM | POA: Diagnosis not present

## 2020-08-06 DIAGNOSIS — I493 Ventricular premature depolarization: Secondary | ICD-10-CM | POA: Diagnosis not present

## 2020-08-06 DIAGNOSIS — E785 Hyperlipidemia, unspecified: Secondary | ICD-10-CM | POA: Diagnosis not present

## 2020-08-08 ENCOUNTER — Telehealth: Payer: Self-pay | Admitting: Interventional Cardiology

## 2020-08-08 DIAGNOSIS — Z23 Encounter for immunization: Secondary | ICD-10-CM | POA: Diagnosis not present

## 2020-08-08 DIAGNOSIS — Z0000 Encounter for general adult medical examination without abnormal findings: Secondary | ICD-10-CM | POA: Diagnosis not present

## 2020-08-08 NOTE — Telephone Encounter (Signed)
See note on monitor results

## 2020-08-08 NOTE — Telephone Encounter (Signed)
Patient returning call for monitor results. 

## 2020-08-22 DIAGNOSIS — E113392 Type 2 diabetes mellitus with moderate nonproliferative diabetic retinopathy without macular edema, left eye: Secondary | ICD-10-CM | POA: Diagnosis not present

## 2020-08-22 DIAGNOSIS — H348312 Tributary (branch) retinal vein occlusion, right eye, stable: Secondary | ICD-10-CM | POA: Diagnosis not present

## 2020-08-22 DIAGNOSIS — E113311 Type 2 diabetes mellitus with moderate nonproliferative diabetic retinopathy with macular edema, right eye: Secondary | ICD-10-CM | POA: Diagnosis not present

## 2020-08-22 DIAGNOSIS — H353132 Nonexudative age-related macular degeneration, bilateral, intermediate dry stage: Secondary | ICD-10-CM | POA: Diagnosis not present

## 2020-08-31 DIAGNOSIS — Z1231 Encounter for screening mammogram for malignant neoplasm of breast: Secondary | ICD-10-CM | POA: Diagnosis not present

## 2020-09-11 ENCOUNTER — Other Ambulatory Visit: Payer: Medicare Other | Admitting: *Deleted

## 2020-09-11 ENCOUNTER — Other Ambulatory Visit: Payer: Self-pay

## 2020-09-11 DIAGNOSIS — E782 Mixed hyperlipidemia: Secondary | ICD-10-CM

## 2020-09-12 ENCOUNTER — Other Ambulatory Visit (HOSPITAL_COMMUNITY): Payer: Self-pay | Admitting: Gastroenterology

## 2020-09-12 DIAGNOSIS — R140 Abdominal distension (gaseous): Secondary | ICD-10-CM | POA: Diagnosis not present

## 2020-09-12 DIAGNOSIS — R112 Nausea with vomiting, unspecified: Secondary | ICD-10-CM | POA: Diagnosis not present

## 2020-09-12 DIAGNOSIS — K219 Gastro-esophageal reflux disease without esophagitis: Secondary | ICD-10-CM | POA: Diagnosis not present

## 2020-09-12 DIAGNOSIS — Z86010 Personal history of colonic polyps: Secondary | ICD-10-CM | POA: Diagnosis not present

## 2020-09-12 DIAGNOSIS — R142 Eructation: Secondary | ICD-10-CM | POA: Diagnosis not present

## 2020-09-14 ENCOUNTER — Telehealth: Payer: Self-pay | Admitting: *Deleted

## 2020-09-14 DIAGNOSIS — R899 Unspecified abnormal finding in specimens from other organs, systems and tissues: Secondary | ICD-10-CM

## 2020-09-14 LAB — COMPREHENSIVE METABOLIC PANEL
ALT: 9 IU/L (ref 0–32)
AST: 30 IU/L (ref 0–40)
Albumin/Globulin Ratio: 1.6 (ref 1.2–2.2)
Albumin: 3.9 g/dL (ref 3.8–4.8)
Alkaline Phosphatase: 87 IU/L (ref 44–121)
BUN/Creatinine Ratio: 13 (ref 12–28)
BUN: 27 mg/dL (ref 8–27)
Bilirubin Total: 0.4 mg/dL (ref 0.0–1.2)
CO2: 24 mmol/L (ref 20–29)
Calcium: 9.3 mg/dL (ref 8.7–10.3)
Chloride: 114 mmol/L — ABNORMAL HIGH (ref 96–106)
Creatinine, Ser: 2.01 mg/dL — ABNORMAL HIGH (ref 0.57–1.00)
Globulin, Total: 2.4 g/dL (ref 1.5–4.5)
Glucose: 100 mg/dL — ABNORMAL HIGH (ref 65–99)
Potassium: 5.6 mmol/L — ABNORMAL HIGH (ref 3.5–5.2)
Total Protein: 6.3 g/dL (ref 6.0–8.5)
eGFR: 26 mL/min/{1.73_m2} — ABNORMAL LOW (ref >59)

## 2020-09-14 LAB — LIPID PANEL
Chol/HDL Ratio: 2.2 ratio (ref 0.0–4.4)
Cholesterol, Total: 186 mg/dL (ref 100–199)
HDL: 84 mg/dL (ref >39)
LDL Chol Calc (NIH): 91 mg/dL (ref 0–99)
Triglycerides: 59 mg/dL (ref 0–149)
VLDL Cholesterol Cal: 11 mg/dL (ref 5–40)

## 2020-09-14 NOTE — Telephone Encounter (Signed)
The patient has been notified of the result and verbalized understanding.  All questions (if any) were answered. Darrell Jewel, RN 09/14/2020 9:53 AM    Patient unable to come today for labs. Set up labs for Monday and gave Dayna Dunn's advisement.  "she needs to completely stop her spironolactone until we can get her safely rechecked, avoid excess potassium in diet and do not use any salt substitutes with potassium in it, potassium supplements, multivitamins with potassium."  Patient verbalized understanding.

## 2020-09-14 NOTE — Telephone Encounter (Signed)
-----   Message from Charlie Pitter, [place] sent at 09/14/2020  9:34 AM EDT ----- Triage, please see my message below from 9:09 - J Witty is on monitors today, so can you please help arrange stat repeat labs? Would probably go ahead and get stat repeat BMET so we can look at the specimen results as a whole. Needs to come in ASAP to have this done so that we have result by the end of the day.

## 2020-09-14 NOTE — Addendum Note (Signed)
Addended by: Darrell Jewel on: 09/14/2020 11:49 AM   Modules accepted: Orders

## 2020-09-17 ENCOUNTER — Other Ambulatory Visit: Payer: Medicare Other | Admitting: *Deleted

## 2020-09-17 ENCOUNTER — Other Ambulatory Visit: Payer: Self-pay

## 2020-09-17 DIAGNOSIS — R899 Unspecified abnormal finding in specimens from other organs, systems and tissues: Secondary | ICD-10-CM

## 2020-09-17 LAB — BASIC METABOLIC PANEL
BUN/Creatinine Ratio: 17 (ref 12–28)
BUN: 32 mg/dL — ABNORMAL HIGH (ref 8–27)
CO2: 29 mmol/L (ref 20–29)
Calcium: 9.6 mg/dL (ref 8.7–10.3)
Chloride: 100 mmol/L (ref 96–106)
Creatinine, Ser: 1.92 mg/dL — ABNORMAL HIGH (ref 0.57–1.00)
Glucose: 149 mg/dL — ABNORMAL HIGH (ref 65–99)
Potassium: 5 mmol/L (ref 3.5–5.2)
Sodium: 140 mmol/L (ref 134–144)
eGFR: 28 mL/min/{1.73_m2} — ABNORMAL LOW (ref >59)

## 2020-09-18 ENCOUNTER — Telehealth (HOSPITAL_BASED_OUTPATIENT_CLINIC_OR_DEPARTMENT_OTHER): Payer: Self-pay | Admitting: *Deleted

## 2020-09-18 ENCOUNTER — Other Ambulatory Visit (HOSPITAL_BASED_OUTPATIENT_CLINIC_OR_DEPARTMENT_OTHER): Payer: Self-pay | Admitting: *Deleted

## 2020-09-18 DIAGNOSIS — E785 Hyperlipidemia, unspecified: Secondary | ICD-10-CM

## 2020-09-18 MED ORDER — ATORVASTATIN CALCIUM 80 MG PO TABS: 80.0000 mg | ORAL_TABLET | Freq: Every day | ORAL | 3 refills | Status: AC

## 2020-09-18 NOTE — Telephone Encounter (Signed)
-----   Message from Dollene Primrose, RN sent at 09/18/2020  8:39 AM EDT -----  ----- Message ----- From: Charlie Pitter, PA-C Sent: 09/17/2020  12:56 PM EDT To: [facility] Triage  This BMET was done to follow up hyperkalemia from last week. Potassium 7/12 was high at 5.6. We told patient to stop spironolactone as a precaution.  Please let patient know labs show improved potassium number, although it is still on the higher end so I suspect the value that she had on 7/12 was legitimately high when it was 5.6. Therefore I would suggest she continue to hold the spironolactone (I.e. do not take anymore going forward). We do not prescribe/manage this medicine in her case - in previous visits she reported that her nephrologist Dr. Joelyn Oms managed her blood pressure regimen. Please a) find out what patient's most recent BP readings have been running and b) place a call to [facility] to make them aware of BP readings/recent hyperkalemia requiring stoppage of spironolactone and request they provide further guidance to patient. (Would place an actual call, not just route in Epic- their nephrologists do not check Epic regularly unless they are periodically covering the hospital.)  Now that we have potassium issue followed up, also want to address lipids on last labs - at time of last LDL check 2 months ago, pt had not been taking atorvastatin as faithfully as she should so was encouraged to do so. Her LDL did improve from 107 to 91, but given her history of atherosclerosis with mild carotid disease, goal LDL is less than 70. Recommend increasing atorvastatin to 80mg  daily and recheck lipid panel/ALT level in 6 weeks.   Thanks!

## 2020-09-18 NOTE — Telephone Encounter (Signed)
This BMET was done to follow up hyperkalemia from last week. Potassium 7/12 washigh at 5.6. We told patient to stop spironolactone as a precaution.   Please let patient know labs show improved potassium number, although it is still on the higher end so I suspect the value that she had on 7/12 was legitimately high when it was 5.6. Therefore I would suggest she continue to hold the spironolactone (I.e. do not take anymore going forward). We do not prescribe/manage this medicine in her case - in previous visits she reported that her nephrologist Dr. Joelyn Oms managed her blood pressure regimen. Please a) find out what patient's most recent BP readings have been running and b) place a call to [facility] to make them aware of BP readings/recent hyperkalemia requiring stoppage of spironolactone and request they provide further guidance to patient. (Would place an actual call, not just route in Epic- their nephrologists do not check Epic regularly unless they areperiodically covering the hospital.)   Now that we have potassium issue followed up, also want to address lipids on last labs - at time of last LDL check 2 months ago, pt had not been taking atorvastatin as faithfully as she should so was encouraged to do so. Her LDL did improve from 107 to 91, but given her history of atherosclerosis with mild carotid disease, goal LDL is less than 70. Recommend increasing atorvastatin to80mg  daily and recheck lipid panel/ALT level in 6 weeks.

## 2020-09-18 NOTE — Telephone Encounter (Signed)
Called [employer], left a detailed message for Caryl Pina, Dr. Mack Guise Assistant to call back.

## 2020-09-21 ENCOUNTER — Other Ambulatory Visit: Payer: Self-pay

## 2020-09-21 ENCOUNTER — Ambulatory Visit (HOSPITAL_COMMUNITY)
Admission: RE | Admit: 2020-09-21 | Discharge: 2020-09-21 | Disposition: A | Discharge status: Home / Self Care | Payer: Medicare Other | Source: Ambulatory Visit | Attending: Gastroenterology | Admitting: Gastroenterology

## 2020-09-21 DIAGNOSIS — R112 Nausea with vomiting, unspecified: Secondary | ICD-10-CM | POA: Diagnosis not present

## 2020-09-21 DIAGNOSIS — R140 Abdominal distension (gaseous): Secondary | ICD-10-CM | POA: Diagnosis not present

## 2020-09-21 DIAGNOSIS — K219 Gastro-esophageal reflux disease without esophagitis: Secondary | ICD-10-CM | POA: Diagnosis not present

## 2020-09-21 DIAGNOSIS — R142 Eructation: Secondary | ICD-10-CM | POA: Diagnosis not present

## 2020-09-21 DIAGNOSIS — K30 Functional dyspepsia: Secondary | ICD-10-CM | POA: Diagnosis not present

## 2020-09-21 MED ORDER — TECHNETIUM TC 99M SULFUR COLLOID
2.2000 | Freq: Once | INTRAVENOUS | Status: AC | PRN
  Administered 2020-09-21: 2.2 via ORAL

## 2020-09-27 ENCOUNTER — Emergency Department (HOSPITAL_COMMUNITY): Payer: Medicare Other | Admitting: Certified Registered"

## 2020-09-27 ENCOUNTER — Inpatient Hospital Stay (HOSPITAL_COMMUNITY)
Admission: EM | Admit: 2020-09-27 | Discharge: 2020-10-09 | DRG: 023 | Disposition: A | Discharge status: Skilled Nursing Facility | Payer: Medicare Other | Attending: Internal Medicine | Admitting: Internal Medicine

## 2020-09-27 ENCOUNTER — Emergency Department (HOSPITAL_COMMUNITY): Payer: Medicare Other

## 2020-09-27 ENCOUNTER — Other Ambulatory Visit: Payer: Self-pay

## 2020-09-27 ENCOUNTER — Encounter (HOSPITAL_COMMUNITY): Payer: Self-pay

## 2020-09-27 ENCOUNTER — Encounter (HOSPITAL_COMMUNITY)
Admission: EM | Disposition: A | Discharge status: Skilled Nursing Facility | Payer: Self-pay | Source: Home / Self Care | Attending: Neurology

## 2020-09-27 ENCOUNTER — Inpatient Hospital Stay (HOSPITAL_COMMUNITY): Payer: Medicare Other

## 2020-09-27 DIAGNOSIS — Z4682 Encounter for fitting and adjustment of non-vascular catheter: Secondary | ICD-10-CM | POA: Diagnosis not present

## 2020-09-27 DIAGNOSIS — D631 Anemia in chronic kidney disease: Secondary | ICD-10-CM | POA: Diagnosis not present

## 2020-09-27 DIAGNOSIS — Z794 Long term (current) use of insulin: Secondary | ICD-10-CM | POA: Diagnosis not present

## 2020-09-27 DIAGNOSIS — I6389 Other cerebral infarction: Secondary | ICD-10-CM | POA: Diagnosis not present

## 2020-09-27 DIAGNOSIS — Z4659 Encounter for fitting and adjustment of other gastrointestinal appliance and device: Secondary | ICD-10-CM

## 2020-09-27 DIAGNOSIS — Z881 Allergy status to other antibiotic agents status: Secondary | ICD-10-CM

## 2020-09-27 DIAGNOSIS — E782 Mixed hyperlipidemia: Secondary | ICD-10-CM | POA: Diagnosis present

## 2020-09-27 DIAGNOSIS — N184 Chronic kidney disease, stage 4 (severe): Secondary | ICD-10-CM | POA: Diagnosis present

## 2020-09-27 DIAGNOSIS — Z515 Encounter for palliative care: Secondary | ICD-10-CM

## 2020-09-27 DIAGNOSIS — R638 Other symptoms and signs concerning food and fluid intake: Secondary | ICD-10-CM

## 2020-09-27 DIAGNOSIS — I6523 Occlusion and stenosis of bilateral carotid arteries: Secondary | ICD-10-CM | POA: Diagnosis not present

## 2020-09-27 DIAGNOSIS — Z87891 Personal history of nicotine dependence: Secondary | ICD-10-CM | POA: Diagnosis not present

## 2020-09-27 DIAGNOSIS — J9690 Respiratory failure, unspecified, unspecified whether with hypoxia or hypercapnia: Secondary | ICD-10-CM | POA: Diagnosis not present

## 2020-09-27 DIAGNOSIS — N183 Chronic kidney disease, stage 3 unspecified: Secondary | ICD-10-CM | POA: Diagnosis present

## 2020-09-27 DIAGNOSIS — G8191 Hemiplegia, unspecified affecting right dominant side: Secondary | ICD-10-CM | POA: Diagnosis present

## 2020-09-27 DIAGNOSIS — I6503 Occlusion and stenosis of bilateral vertebral arteries: Secondary | ICD-10-CM | POA: Diagnosis not present

## 2020-09-27 DIAGNOSIS — I63412 Cerebral infarction due to embolism of left middle cerebral artery: Secondary | ICD-10-CM | POA: Diagnosis not present

## 2020-09-27 DIAGNOSIS — J189 Pneumonia, unspecified organism: Secondary | ICD-10-CM | POA: Diagnosis not present

## 2020-09-27 DIAGNOSIS — R402421 Glasgow coma scale score 9-12, in the field [EMT or ambulance]: Secondary | ICD-10-CM | POA: Diagnosis not present

## 2020-09-27 DIAGNOSIS — I5032 Chronic diastolic (congestive) heart failure: Secondary | ICD-10-CM | POA: Diagnosis present

## 2020-09-27 DIAGNOSIS — B952 Enterococcus as the cause of diseases classified elsewhere: Secondary | ICD-10-CM | POA: Diagnosis present

## 2020-09-27 DIAGNOSIS — Z20822 Contact with and (suspected) exposure to covid-19: Secondary | ICD-10-CM | POA: Diagnosis not present

## 2020-09-27 DIAGNOSIS — Z6837 Body mass index (BMI) 37.0-37.9, adult: Secondary | ICD-10-CM

## 2020-09-27 DIAGNOSIS — I480 Paroxysmal atrial fibrillation: Secondary | ICD-10-CM | POA: Diagnosis not present

## 2020-09-27 DIAGNOSIS — R4701 Aphasia: Secondary | ICD-10-CM | POA: Diagnosis present

## 2020-09-27 DIAGNOSIS — R0902 Hypoxemia: Secondary | ICD-10-CM | POA: Diagnosis not present

## 2020-09-27 DIAGNOSIS — E669 Obesity, unspecified: Secondary | ICD-10-CM | POA: Diagnosis not present

## 2020-09-27 DIAGNOSIS — Z7401 Bed confinement status: Secondary | ICD-10-CM | POA: Diagnosis not present

## 2020-09-27 DIAGNOSIS — N170 Acute kidney failure with tubular necrosis: Secondary | ICD-10-CM | POA: Diagnosis not present

## 2020-09-27 DIAGNOSIS — Z7984 Long term (current) use of oral hypoglycemic drugs: Secondary | ICD-10-CM | POA: Diagnosis not present

## 2020-09-27 DIAGNOSIS — M7989 Other specified soft tissue disorders: Secondary | ICD-10-CM | POA: Diagnosis not present

## 2020-09-27 DIAGNOSIS — R1310 Dysphagia, unspecified: Secondary | ICD-10-CM

## 2020-09-27 DIAGNOSIS — Z66 Do not resuscitate: Secondary | ICD-10-CM | POA: Diagnosis not present

## 2020-09-27 DIAGNOSIS — I639 Cerebral infarction, unspecified: Secondary | ICD-10-CM

## 2020-09-27 DIAGNOSIS — Z9889 Other specified postprocedural states: Secondary | ICD-10-CM | POA: Diagnosis not present

## 2020-09-27 DIAGNOSIS — E860 Dehydration: Secondary | ICD-10-CM | POA: Diagnosis not present

## 2020-09-27 DIAGNOSIS — I63312 Cerebral infarction due to thrombosis of left middle cerebral artery: Secondary | ICD-10-CM | POA: Diagnosis not present

## 2020-09-27 DIAGNOSIS — T508X5A Adverse effect of diagnostic agents, initial encounter: Secondary | ICD-10-CM | POA: Diagnosis not present

## 2020-09-27 DIAGNOSIS — G459 Transient cerebral ischemic attack, unspecified: Secondary | ICD-10-CM | POA: Diagnosis not present

## 2020-09-27 DIAGNOSIS — I959 Hypotension, unspecified: Secondary | ICD-10-CM | POA: Diagnosis not present

## 2020-09-27 DIAGNOSIS — I10 Essential (primary) hypertension: Secondary | ICD-10-CM | POA: Diagnosis present

## 2020-09-27 DIAGNOSIS — I2729 Other secondary pulmonary hypertension: Secondary | ICD-10-CM | POA: Diagnosis not present

## 2020-09-27 DIAGNOSIS — S91339A Puncture wound without foreign body, unspecified foot, initial encounter: Secondary | ICD-10-CM

## 2020-09-27 DIAGNOSIS — Z79899 Other long term (current) drug therapy: Secondary | ICD-10-CM

## 2020-09-27 DIAGNOSIS — Z7189 Other specified counseling: Secondary | ICD-10-CM

## 2020-09-27 DIAGNOSIS — R29727 NIHSS score 27: Secondary | ICD-10-CM | POA: Diagnosis present

## 2020-09-27 DIAGNOSIS — R404 Transient alteration of awareness: Secondary | ICD-10-CM | POA: Diagnosis not present

## 2020-09-27 DIAGNOSIS — E1165 Type 2 diabetes mellitus with hyperglycemia: Secondary | ICD-10-CM | POA: Diagnosis present

## 2020-09-27 DIAGNOSIS — Z8249 Family history of ischemic heart disease and other diseases of the circulatory system: Secondary | ICD-10-CM

## 2020-09-27 DIAGNOSIS — I63512 Cerebral infarction due to unspecified occlusion or stenosis of left middle cerebral artery: Secondary | ICD-10-CM | POA: Diagnosis not present

## 2020-09-27 DIAGNOSIS — I130 Hypertensive heart and chronic kidney disease with heart failure and stage 1 through stage 4 chronic kidney disease, or unspecified chronic kidney disease: Secondary | ICD-10-CM | POA: Diagnosis not present

## 2020-09-27 DIAGNOSIS — G9341 Metabolic encephalopathy: Secondary | ICD-10-CM | POA: Diagnosis not present

## 2020-09-27 DIAGNOSIS — I517 Cardiomegaly: Secondary | ICD-10-CM | POA: Diagnosis not present

## 2020-09-27 DIAGNOSIS — I2510 Atherosclerotic heart disease of native coronary artery without angina pectoris: Secondary | ICD-10-CM | POA: Diagnosis present

## 2020-09-27 DIAGNOSIS — J9601 Acute respiratory failure with hypoxia: Secondary | ICD-10-CM | POA: Diagnosis not present

## 2020-09-27 DIAGNOSIS — G4733 Obstructive sleep apnea (adult) (pediatric): Secondary | ICD-10-CM | POA: Diagnosis present

## 2020-09-27 DIAGNOSIS — R29810 Facial weakness: Secondary | ICD-10-CM | POA: Diagnosis not present

## 2020-09-27 DIAGNOSIS — Z888 Allergy status to other drugs, medicaments and biological substances status: Secondary | ICD-10-CM

## 2020-09-27 DIAGNOSIS — N1832 Chronic kidney disease, stage 3b: Secondary | ICD-10-CM | POA: Diagnosis not present

## 2020-09-27 DIAGNOSIS — I509 Heart failure, unspecified: Secondary | ICD-10-CM | POA: Diagnosis not present

## 2020-09-27 DIAGNOSIS — N179 Acute kidney failure, unspecified: Secondary | ICD-10-CM | POA: Diagnosis not present

## 2020-09-27 DIAGNOSIS — E0590 Thyrotoxicosis, unspecified without thyrotoxic crisis or storm: Secondary | ICD-10-CM | POA: Diagnosis present

## 2020-09-27 DIAGNOSIS — R1319 Other dysphagia: Secondary | ICD-10-CM | POA: Diagnosis not present

## 2020-09-27 DIAGNOSIS — N390 Urinary tract infection, site not specified: Secondary | ICD-10-CM | POA: Diagnosis not present

## 2020-09-27 DIAGNOSIS — Z8673 Personal history of transient ischemic attack (TIA), and cerebral infarction without residual deficits: Secondary | ICD-10-CM

## 2020-09-27 DIAGNOSIS — L8990 Pressure ulcer of unspecified site, unspecified stage: Secondary | ICD-10-CM | POA: Diagnosis present

## 2020-09-27 DIAGNOSIS — E119 Type 2 diabetes mellitus without complications: Secondary | ICD-10-CM | POA: Diagnosis not present

## 2020-09-27 DIAGNOSIS — Z9989 Dependence on other enabling machines and devices: Secondary | ICD-10-CM | POA: Diagnosis not present

## 2020-09-27 HISTORY — PX: RADIOLOGY WITH ANESTHESIA: SHX6223

## 2020-09-27 HISTORY — PX: IR CT HEAD LTD: IMG2386

## 2020-09-27 HISTORY — PX: IR PERCUTANEOUS ART THROMBECTOMY/INFUSION INTRACRANIAL INC DIAG ANGIO: IMG6087

## 2020-09-27 HISTORY — PX: IR US GUIDE VASC ACCESS RIGHT: IMG2390

## 2020-09-27 LAB — APTT: aPTT: 29 seconds (ref 24–36)

## 2020-09-27 LAB — DIFFERENTIAL
Abs Immature Granulocytes: 0.05 10*3/uL (ref 0.00–0.07)
Basophils Absolute: 0 10*3/uL (ref 0.0–0.1)
Basophils Relative: 0 %
Eosinophils Absolute: 0 10*3/uL (ref 0.0–0.5)
Eosinophils Relative: 0 %
Immature Granulocytes: 0 %
Lymphocytes Relative: 4 %
Lymphs Abs: 0.7 10*3/uL (ref 0.7–4.0)
Monocytes Absolute: 1.4 10*3/uL — ABNORMAL HIGH (ref 0.1–1.0)
Monocytes Relative: 8 %
Neutro Abs: 15.9 10*3/uL — ABNORMAL HIGH (ref 1.7–7.7)
Neutrophils Relative %: 88 %

## 2020-09-27 LAB — COMPREHENSIVE METABOLIC PANEL
ALT: 13 U/L (ref 0–44)
AST: 38 U/L (ref 15–41)
Albumin: 3.2 g/dL — ABNORMAL LOW (ref 3.5–5.0)
Alkaline Phosphatase: 69 U/L (ref 38–126)
Anion gap: 21 — ABNORMAL HIGH (ref 5–15)
BUN: 35 mg/dL — ABNORMAL HIGH (ref 8–23)
CO2: 19 mmol/L — ABNORMAL LOW (ref 22–32)
Calcium: 9.1 mg/dL (ref 8.9–10.3)
Chloride: 97 mmol/L — ABNORMAL LOW (ref 98–111)
Creatinine, Ser: 2.78 mg/dL — ABNORMAL HIGH (ref 0.44–1.00)
GFR, Estimated: 18 mL/min — ABNORMAL LOW (ref >60)
Glucose, Bld: 389 mg/dL — ABNORMAL HIGH (ref 70–99)
Potassium: 4.7 mmol/L (ref 3.5–5.1)
Sodium: 137 mmol/L (ref 135–145)
Total Bilirubin: 2.9 mg/dL — ABNORMAL HIGH (ref 0.3–1.2)
Total Protein: 6.2 g/dL — ABNORMAL LOW (ref 6.5–8.1)

## 2020-09-27 LAB — RESP PANEL BY RT-PCR (FLU A&B, COVID) ARPGX2
Influenza A by PCR: NEGATIVE
Influenza B by PCR: NEGATIVE
SARS Coronavirus 2 by RT PCR: NEGATIVE

## 2020-09-27 LAB — CBC
HCT: 38.9 % (ref 36.0–46.0)
Hemoglobin: 12.2 g/dL (ref 12.0–15.0)
MCH: 29.3 pg (ref 26.0–34.0)
MCHC: 31.4 g/dL (ref 30.0–36.0)
MCV: 93.3 fL (ref 80.0–100.0)
Platelets: 343 10*3/uL (ref 150–400)
RBC: 4.17 MIL/uL (ref 3.87–5.11)
RDW: 15.5 % (ref 11.5–15.5)
WBC: 18.2 10*3/uL — ABNORMAL HIGH (ref 4.0–10.5)
nRBC: 0 % (ref 0.0–0.2)

## 2020-09-27 LAB — I-STAT CHEM 8, ED
BUN: 33 mg/dL — ABNORMAL HIGH (ref 8–23)
Calcium, Ion: 1.03 mmol/L — ABNORMAL LOW (ref 1.15–1.40)
Chloride: 101 mmol/L (ref 98–111)
Creatinine, Ser: 2.6 mg/dL — ABNORMAL HIGH (ref 0.44–1.00)
Glucose, Bld: 384 mg/dL — ABNORMAL HIGH (ref 70–99)
HCT: 40 % (ref 36.0–46.0)
Hemoglobin: 13.6 g/dL (ref 12.0–15.0)
Potassium: 4.5 mmol/L (ref 3.5–5.1)
Sodium: 135 mmol/L (ref 135–145)
TCO2: 19 mmol/L — ABNORMAL LOW (ref 22–32)

## 2020-09-27 LAB — GLUCOSE, CAPILLARY: Glucose-Capillary: 320 mg/dL — ABNORMAL HIGH (ref 70–99)

## 2020-09-27 LAB — CBG MONITORING, ED: Glucose-Capillary: 378 mg/dL — ABNORMAL HIGH (ref 70–99)

## 2020-09-27 LAB — ETHANOL: Alcohol, Ethyl (B): <10 mg/dL (ref <10)

## 2020-09-27 LAB — PROTIME-INR
INR: 1.1 (ref 0.8–1.2)
Prothrombin Time: 14.2 seconds (ref 11.4–15.2)

## 2020-09-27 SURGERY — IR WITH ANESTHESIA
Anesthesia: General

## 2020-09-27 MED ORDER — SODIUM CHLORIDE 0.9 % IV SOLN: INTRAVENOUS | Status: DC

## 2020-09-27 MED ORDER — ACETAMINOPHEN 650 MG RE SUPP: 650.0000 mg | RECTAL | Status: DC | PRN

## 2020-09-27 MED ORDER — INSULIN ASPART 100 UNIT/ML IJ SOLN
0.0000 [IU] | INTRAMUSCULAR | Status: DC
Start: 2020-09-27 — End: 2020-10-07
  Administered 2020-09-27: 15 [IU] via SUBCUTANEOUS
  Administered 2020-09-28: 4 [IU] via SUBCUTANEOUS
  Administered 2020-09-28: 3 [IU] via SUBCUTANEOUS
  Administered 2020-09-28 (×2): 4 [IU] via SUBCUTANEOUS
  Administered 2020-09-28: 3 [IU] via SUBCUTANEOUS
  Administered 2020-09-28: 4 [IU] via SUBCUTANEOUS
  Administered 2020-09-29 (×2): 7 [IU] via SUBCUTANEOUS
  Administered 2020-09-29: 20 [IU] via SUBCUTANEOUS
  Administered 2020-09-29: 15 [IU] via SUBCUTANEOUS
  Administered 2020-09-29 – 2020-09-30 (×2): 7 [IU] via SUBCUTANEOUS
  Administered 2020-09-30: 4 [IU] via SUBCUTANEOUS
  Administered 2020-09-30: 11 [IU] via SUBCUTANEOUS
  Administered 2020-09-30: 7 [IU] via SUBCUTANEOUS
  Administered 2020-09-30: 4 [IU] via SUBCUTANEOUS
  Administered 2020-09-30: 11 [IU] via SUBCUTANEOUS
  Administered 2020-09-30: 7 [IU] via SUBCUTANEOUS
  Administered 2020-10-01: 3 [IU] via SUBCUTANEOUS
  Administered 2020-10-01 (×4): 7 [IU] via SUBCUTANEOUS
  Administered 2020-10-02: 15 [IU] via SUBCUTANEOUS
  Administered 2020-10-02: 4 [IU] via SUBCUTANEOUS
  Administered 2020-10-02: 3 [IU] via SUBCUTANEOUS
  Administered 2020-10-02: 11 [IU] via SUBCUTANEOUS
  Administered 2020-10-03 (×2): 15 [IU] via SUBCUTANEOUS
  Administered 2020-10-03: 7 [IU] via SUBCUTANEOUS
  Administered 2020-10-03 (×3): 11 [IU] via SUBCUTANEOUS
  Administered 2020-10-04: 7 [IU] via SUBCUTANEOUS
  Administered 2020-10-04 (×2): 11 [IU] via SUBCUTANEOUS
  Administered 2020-10-04: 7 [IU] via SUBCUTANEOUS
  Administered 2020-10-04: 11 [IU] via SUBCUTANEOUS
  Administered 2020-10-05: 3 [IU] via SUBCUTANEOUS
  Administered 2020-10-05 (×2): 4 [IU] via SUBCUTANEOUS
  Administered 2020-10-05 (×2): 7 [IU] via SUBCUTANEOUS
  Administered 2020-10-05 – 2020-10-06 (×3): 4 [IU] via SUBCUTANEOUS
  Administered 2020-10-06: 3 [IU] via SUBCUTANEOUS
  Administered 2020-10-06: 4 [IU] via SUBCUTANEOUS
  Administered 2020-10-07: 3 [IU] via SUBCUTANEOUS

## 2020-09-27 MED ORDER — IOHEXOL 300 MG/ML  SOLN
100.0000 mL | Freq: Once | INTRAMUSCULAR | Status: AC | PRN
  Administered 2020-09-27: 70 mL via INTRA_ARTERIAL

## 2020-09-27 MED ORDER — STROKE: EARLY STAGES OF RECOVERY BOOK
Freq: Once | Status: AC
  Administered 2020-10-01: 1
  Filled 2020-09-27: qty 1

## 2020-09-27 MED ORDER — LABETALOL HCL 5 MG/ML IV SOLN
INTRAVENOUS | Status: DC | PRN
  Administered 2020-09-27: 10 mg via INTRAVENOUS
  Administered 2020-09-27 (×2): 5 mg via INTRAVENOUS

## 2020-09-27 MED ORDER — LACTATED RINGERS IV SOLN: INTRAVENOUS | Status: DC | PRN

## 2020-09-27 MED ORDER — METHIMAZOLE 5 MG PO TABS
5.0000 mg | ORAL_TABLET | Freq: Every day | ORAL | Status: DC
  Filled 2020-09-27: qty 1

## 2020-09-27 MED ORDER — EPHEDRINE SULFATE-NACL 50-0.9 MG/10ML-% IV SOSY
PREFILLED_SYRINGE | INTRAVENOUS | Status: DC | PRN
  Administered 2020-09-27: 15 mg via INTRAVENOUS

## 2020-09-27 MED ORDER — IOHEXOL 350 MG/ML SOLN
100.0000 mL | Freq: Once | INTRAVENOUS | Status: AC | PRN
  Administered 2020-09-27: 100 mL via INTRAVENOUS

## 2020-09-27 MED ORDER — ACETAMINOPHEN 325 MG PO TABS
650.0000 mg | ORAL_TABLET | ORAL | Status: DC | PRN
  Administered 2020-10-03: 650 mg via ORAL
  Filled 2020-09-27 (×2): qty 2

## 2020-09-27 MED ORDER — CHLORHEXIDINE GLUCONATE CLOTH 2 % EX PADS
6.0000 | MEDICATED_PAD | Freq: Every day | CUTANEOUS | Status: DC
  Administered 2020-09-27 – 2020-10-07 (×9): 6 via TOPICAL

## 2020-09-27 MED ORDER — SENNOSIDES-DOCUSATE SODIUM 8.6-50 MG PO TABS: 1.0000 | ORAL_TABLET | Freq: Every evening | ORAL | Status: DC | PRN

## 2020-09-27 MED ORDER — LIDOCAINE 2% (20 MG/ML) 5 ML SYRINGE
INTRAMUSCULAR | Status: DC | PRN
  Administered 2020-09-27: 100 mg via INTRAVENOUS

## 2020-09-27 MED ORDER — CLEVIDIPINE BUTYRATE 0.5 MG/ML IV EMUL
0.0000 mg/h | INTRAVENOUS | Status: DC
  Administered 2020-09-28: 1 mg/h via INTRAVENOUS
  Filled 2020-09-27: qty 50

## 2020-09-27 MED ORDER — ATORVASTATIN CALCIUM 40 MG PO TABS: 80.0000 mg | ORAL_TABLET | Freq: Every day | ORAL | Status: DC

## 2020-09-27 MED ORDER — SUCCINYLCHOLINE CHLORIDE 200 MG/10ML IV SOSY
PREFILLED_SYRINGE | INTRAVENOUS | Status: DC | PRN
Start: 2020-09-27 — End: 2020-09-27
  Administered 2020-09-27: 140 mg via INTRAVENOUS

## 2020-09-27 MED ORDER — FENTANYL CITRATE (PF) 250 MCG/5ML IJ SOLN
INTRAMUSCULAR | Status: DC | PRN
  Administered 2020-09-27: 50 ug via INTRAVENOUS

## 2020-09-27 MED ORDER — CHLORTHALIDONE 25 MG PO TABS: 25.0000 mg | ORAL_TABLET | Freq: Every day | ORAL | Status: DC

## 2020-09-27 MED ORDER — CARVEDILOL 3.125 MG PO TABS
3.1250 mg | ORAL_TABLET | Freq: Two times a day (BID) | ORAL | Status: DC
  Filled 2020-09-27: qty 1

## 2020-09-27 MED ORDER — PROPOFOL 10 MG/ML IV BOLUS
INTRAVENOUS | Status: DC | PRN
  Administered 2020-09-27: 150 mg via INTRAVENOUS

## 2020-09-27 MED ORDER — PHENYLEPHRINE HCL-NACL 10-0.9 MG/250ML-% IV SOLN
INTRAVENOUS | Status: DC | PRN
  Administered 2020-09-27: 50 ug/min via INTRAVENOUS

## 2020-09-27 MED ORDER — INSULIN ASPART 100 UNIT/ML IJ SOLN
INTRAMUSCULAR | Status: AC
  Filled 2020-09-27: qty 1

## 2020-09-27 MED ORDER — AMLODIPINE BESYLATE 5 MG PO TABS: 10.0000 mg | ORAL_TABLET | Freq: Every day | ORAL | Status: DC

## 2020-09-27 MED ORDER — ROCURONIUM BROMIDE 10 MG/ML (PF) SYRINGE
PREFILLED_SYRINGE | INTRAVENOUS | Status: DC | PRN
  Administered 2020-09-27: 50 mg via INTRAVENOUS

## 2020-09-27 MED ORDER — ORAL CARE MOUTH RINSE
15.0000 mL | Freq: Two times a day (BID) | OROMUCOSAL | Status: DC
  Administered 2020-09-27: 15 mL via OROMUCOSAL

## 2020-09-27 MED ORDER — INSULIN ASPART 100 UNIT/ML IJ SOLN
0.0000 [IU] | Freq: Three times a day (TID) | INTRAMUSCULAR | Status: DC
  Administered 2020-09-27: 7 [IU] via SUBCUTANEOUS

## 2020-09-27 MED ORDER — ACETAMINOPHEN 160 MG/5ML PO SOLN
650.0000 mg | ORAL | Status: DC | PRN
  Administered 2020-10-02: 650 mg
  Filled 2020-09-27: qty 20.3

## 2020-09-27 MED ORDER — FENTANYL CITRATE (PF) 100 MCG/2ML IJ SOLN
INTRAMUSCULAR | Status: AC
  Filled 2020-09-27: qty 2

## 2020-09-27 MED ORDER — CARBOXYMETHYLCELLULOSE SODIUM 0.5 % OP SOLN: 1.0000 [drp] | Freq: Three times a day (TID) | OPHTHALMIC | Status: DC | PRN

## 2020-09-27 MED ORDER — SUGAMMADEX SODIUM 200 MG/2ML IV SOLN
INTRAVENOUS | Status: DC | PRN
  Administered 2020-09-27: 200 mg via INTRAVENOUS

## 2020-09-27 MED ORDER — PHENYLEPHRINE 40 MCG/ML (10ML) SYRINGE FOR IV PUSH (FOR BLOOD PRESSURE SUPPORT)
PREFILLED_SYRINGE | INTRAVENOUS | Status: DC | PRN
  Administered 2020-09-27: 120 ug via INTRAVENOUS
  Administered 2020-09-27: 80 ug via INTRAVENOUS

## 2020-09-27 MED ORDER — POLYVINYL ALCOHOL 1.4 % OP SOLN
1.0000 [drp] | OPHTHALMIC | Status: DC | PRN
  Filled 2020-09-27: qty 15

## 2020-09-27 NOTE — Anesthesia Procedure Notes (Signed)
Procedure Name: Intubation Date/Time: 09/27/2020 2:33 PM Performed by: Lance Coon, CRNA Pre-anesthesia Checklist: Patient identified, Emergency Drugs available, Suction available, Patient being monitored and Timeout performed Patient Re-evaluated:Patient Re-evaluated prior to induction Oxygen Delivery Method: Circle system utilized Preoxygenation: Pre-oxygenation with 100% oxygen Induction Type: IV induction, Rapid sequence and Cricoid Pressure applied Laryngoscope Size: Mac and 3 Grade View: Grade II Tube type: Oral Tube size: 7.0 mm Number of attempts: 1 Airway Equipment and Method: Stylet Placement Confirmation: ETT inserted through vocal cords under direct vision, positive ETCO2 and breath sounds checked- equal and bilateral Secured at: 22 cm Tube secured with: Tape Dental Injury: Teeth and Oropharynx as per pre-operative assessment

## 2020-09-27 NOTE — ED Notes (Signed)
Pt CBG was 378, notified Leslie(RN)

## 2020-09-27 NOTE — Anesthesia Procedure Notes (Signed)
Arterial Line Insertion Start/End7/28/2022 2:30 PM, 09/27/2020 2:43 PM Performed by: Lance Coon, CRNA, CRNA  Preanesthetic checklist: patient identified, IV checked, site marked, risks and benefits discussed, surgical consent, monitors and equipment checked, pre-op evaluation, timeout performed and anesthesia consent Emergency situation Patient sedated Left, radial was placed Hand hygiene performed , maximum sterile barriers used  and Seldinger technique used  Attempts: 1 Procedure performed without using ultrasound guided technique. Following insertion, dressing applied and Biopatch. Post procedure assessment: normal  Patient tolerated the procedure well with no immediate complications.

## 2020-09-27 NOTE — Procedures (Addendum)
Neuro-Interventional Radiology  Post Cerebral Angiogram Procedure Note  Operator:    Dr. Earleen Newport Assistant:   None  History:   70 yo female, last known well last evening, presents to ED after neighbor discovered her unresponsive.   CT imaging shows infarction of left pre frontal region, ~40cc on perfusion with correlation to the non-contrast, and ASPECTS 7.   Baseline mRS:  0 NIHSS:   27 Site of occlusion:  Left MCA   Procedure: US guided right CFA access Cervical & Cerebral Angiogram Mechanical Thrombectomy Deployment of Angioseal Flat Panel CT in NIR  First Pass Device:  Direct aspiration with [id number]  Result   TICI 3  Findings:   Pre: TICI 0 through occluded segment     Post: TICI 3    Anesthesia:   GETA  EBL:    88KC     Complication:  None   Medication: IV tPA administered?: no IA Medication:  no  Recommendations: - right hip straight 6 hrs - Goal SBP 120-140.   - Frequent NV checks - Repeat CT or MRI imaging recommended within 36 hours, discretion of Neurology - NIR to follow  - Sister Horris Latino was updated. She states she has patient's living will/DNR that will need to be received by our [facility] team.     Signed,  Dulcy Fanny. Earleen Newport, DO

## 2020-09-27 NOTE — Anesthesia Preprocedure Evaluation (Addendum)
Anesthesia Evaluation  Patient identified by MRN, date of birth, ID band Patient confused    Reviewed: Allergy & Precautions, Patient's Chart, lab work & pertinent test resultsPreop documentation limited or incomplete due to emergent nature of procedure.  History of Anesthesia Complications (+) history of anesthetic complications  Airway   TM Distance: >3 FB Neck ROM: Full    Dental no notable dental hx.  Extensive heme on lips and mouth,  :   Pulmonary shortness of breath, sleep apnea and Continuous Positive Airway Pressure Ventilation , COPD, former smoker,    breath sounds clear to auscultation   rales    Cardiovascular hypertension, Pt. on medications + Peripheral Vascular Disease  Normal cardiovascular exam Rhythm:Regular Rate:Normal - Carotid Bruit ECHO 4/22 Left Ventricle: Left ventricular ejection fraction, by estimation, is 55  to 60%. The left ventricle has normal function. The left ventricle has no  regional wall motion abnormalities. The left ventricular internal cavity  size was normal in size. There is  moderate concentric left ventricular hypertrophy. Left ventricular  diastolic parameters are consistent with Grade II diastolic dysfunction   Myo perfusion 4/22 Nuclear stress EF: 61%. There was no ST segment deviation noted during stress. There is a small defect of moderate severity present in the apex location. The defect is non-reversible. In the setting of normal LVF, this is consistent with attenuation artifact. No ischemia noted. This is a low risk study.     Neuro/Psych negative neurological ROS  negative psych ROS   GI/Hepatic negative GI ROS, Neg liver ROS,   Endo/Other  diabetes, Type 2Hyperthyroidism Morbid obesity  Renal/GU CRFRenal disease     Musculoskeletal negative musculoskeletal ROS (+)   Abdominal (+) + obese,   Peds  Hematology negative hematology ROS (+)   Anesthesia Other  Findings   Reproductive/Obstetrics negative OB ROS                             Anesthesia Physical  Anesthesia Plan  ASA: 4 and emergent  Anesthesia Plan: General   Post-op Pain Management:    Induction: Intravenous, Cricoid pressure planned and Rapid sequence  PONV Risk Score and Plan: 3  Airway Management Planned: Oral ETT  Additional Equipment: Arterial line  Intra-op Plan:   Post-operative Plan: Extubation in OR and Post-operative intubation/ventilation  Informed Consent: I have reviewed the patients History and Physical, chart, labs and discussed the procedure including the risks, benefits and alternatives for the proposed anesthesia with the patient or authorized representative who has indicated his/her understanding and acceptance.     Dental advisory given  Plan Discussed with: CRNA and Anesthesiologist  Anesthesia Plan Comments: ( [facility] from home, pt seen by neighbors at [place] closing her garage door. Neighbor went to check on her approx 1300 found pt on the floor. Dried blood around her and around her mouth. Upon EMS arrival pt with snoring respirations, bruising and swelling to Right side of her face. Left side facial droop, left side gaze and Right side weakness. LKW 10am)      Anesthesia Quick Evaluation

## 2020-09-27 NOTE — Anesthesia Postprocedure Evaluation (Signed)
Anesthesia Post Note  Patient: Kathleen [name]  Procedure(s) Performed: IR WITH ANESTHESIA     Patient location during evaluation: PACU Anesthesia Type: General Level of consciousness: awake Pain management: pain level controlled Vital Signs Assessment: post-procedure vital signs reviewed and stable Respiratory status: spontaneous breathing, nonlabored ventilation, respiratory function stable and patient connected to nasal cannula oxygen Cardiovascular status: blood pressure returned to baseline and stable Postop Assessment: no apparent nausea or vomiting Anesthetic complications: no   No notable events documented.  Last Vitals:  Vitals:   09/27/20 2000 09/27/20 2010  BP:  118/67  Pulse: 79 95  Resp: 19 18  Temp: 37.1 C 37.1 C  SpO2: 96% 95%    Last Pain:  Vitals:   09/27/20 2010  TempSrc: Axillary                 Drexler Maland P Winson Eichorn

## 2020-09-27 NOTE — Code Documentation (Signed)
Stroke Response Nurse Documentation Code Documentation  Kathleen [name] is a 70 y.o. female arriving to [place] ED via [facility] on 09/27/20 with past medical hx of diabetes, hypertension, hyperlipidemia, obesity, pulmonary hypertension, right sided hear failure, hyperthyroidism, sinus bradycardia, chronic kidney disease and obstructive sleep apnea on CPAP.   Code stroke was activated by EMS. Patient from home where she lives alone and was LKW at 1000 when the neighbor saw her opening garage door. Neighbor later found her on the ground in her house with blood near her head. EMS activated code stroke as patient was nonverbal with left sided weakness and fixed gaze. The neighbor was unsure what medications she takes but thought she was on a blood thinner.   Stroke team at the bedside on patient arrival. Labs drawn and patient cleared for CT by Dr. Tomi Bamberger. Patient to CT with team. NIHSS 27, see documentation for details and code stroke times. Patient with disoriented, not following commands, left gaze preference , bilateral hemianopia, left arm weakness, bilateral leg weakness, bilateral decreased sensation, Global aphasia , dysarthria , and Visual  neglect on exam.   The following imaging was completed: CT, CTA head and neck. Patient is not a candidate for tPA due to outside of window. Care/Plan admit to ICU post IR intervention. Magda Paganini, RN provided bedside report to [place], Therapist, sports.   Leverne Humbles Stroke Response RN

## 2020-09-27 NOTE — Progress Notes (Signed)
Pt intubated and sedated. Unable to assess NIH

## 2020-09-27 NOTE — ED Provider Notes (Signed)
[facility] EMERGENCY DEPARTMENT Provider Note   CSN: [id number] Arrival date & time: 09/27/20  1347     History Chief Complaint  Patient presents with   Code Stroke    Kathleen [name] is a 70 y.o. female who presents the emergency department after being found down by a neighbor, last known well was at 10 AM this morning when her neighbor saw her opening and closing her garage door.  Neighbor later went to check on her and found her on the floor with dried blood around her mouth.  She was notably unable to speak or follow commands with obvious right-sided weakness and facial droop.  Upon arrival patient was noted to have right-sided weakness with some spasticity, a leftward deviated gaze, some sonorous respirations.  She was unable to follow commands or communicate.  History is gathered by EMS, phone call with the patient's sister and her neighbor as well.  She has a past medical history of carotid artery disease, stage IV chronic kidney disease, diabetes, hyperlipidemia, hypertension, heart disease, hypothyroidism morbid obesity, OSA, pulmonary hypertension and heart failure.  Patient arrived as a code stroke.  The history is provided by the EMS personnel, a relative and a friend. The history is limited by the condition of the patient.      Past Medical History:  Diagnosis Date   Carotid artery plaque    duplex 07/2020 1-39% BICA   CKD (chronic kidney disease), stage IV (HCC)    Complication of anesthesia    pt states stopped breathing when having tonsillectomy at age 7; pt states has had anesthesia since then without difficulty    Diabetes mellitus    Glaucoma    Heart disease    Hyperlipidemia    Hypertension    Hyperthyroidism    Menopause    Mixed hyperlipidemia 09/22/2013   Morbid obesity ([place])    Multinodular goiter    AND HYPERTHYROIDISM    Obesity (BMI 30-39.9) 04/30/2015   OSA on CPAP    Proliferative retinopathy    moderate, Dr. Posey Pronto   Proteinuria     Dr. Hassell Done   Pulmonary hypertension ([place])    PVC's (premature ventricular contractions)    Right-sided heart failure ([place])    Sinus bradycardia    Trigger finger    Dr. Rip Harbour    Patient Active Problem List   Diagnosis Date Noted   Uncontrolled type 2 diabetes mellitus with hypoglycemia without coma ([place]) 08/26/2017   Hypertensive heart disease 06/23/2016   CRI (chronic renal insufficiency), stage 3 (moderate) ([place]) 06/23/2016   Left rib fracture 05/25/2016   Pneumothorax on left 05/23/2016   PVC's (premature ventricular contractions) 11/28/2015   OSA (obstructive sleep apnea) 04/30/2015   Obesity (BMI 30-39.9) 04/30/2015   Status post gastric banding 02/27/2015   Varicose veins of leg with complications 67/20/9470   Metatarsal deformity 10/04/2013   Nonspecific abnormal unspecified cardiovascular function study 09/29/2013   Mixed hyperlipidemia 09/22/2013   Tenosynovitis of foot and ankle 09/14/2013   Pronation deformity of ankle, acquired 09/14/2013   Deformity of metatarsal bone of right foot 09/14/2013   Deformity of metatarsal bone of left foot 09/14/2013   Proteinuria 07/28/2012   DYSPNEA 01/11/2008   HYPERTHYROIDISM 05/10/2007   UNSPECIFIED DISORDER OF THYROID 05/06/2007   DIABETES, TYPE 2 03/22/2007   HYPERLIPIDEMIA 03/22/2007   Essential hypertension 03/22/2007   C O P D 03/22/2007   MEDIASTINAL ADENOPATHY CASTLEMANS D 03/22/2007   OXYGEN-USE OF SUPPLEMENTAL 03/22/2007  Past Surgical History:  Procedure Laterality Date   GASTRIC BANDING PORT REVISION N/A 02/27/2015   Procedure: GASTRIC BANDING PORT REVISION;  Surgeon: Johnathan Hausen, MD;  Location: [facility] ORS;  Service: General;  Laterality: N/A;   LAPAROSCOPIC GASTRIC BANDING  2010   NERVE SURGERY  2001   SPLIT NIGHT STUDY  08/03/2015   TONSILLECTOMY  1958     OB History   No obstetric history on file.     Family History  Problem Relation Age of Onset   Heart attack Father     Social History    Tobacco Use   Smoking status: Former    Packs/day: 1.00    Years: 30.00    Pack years: 30.00    Types: Cigarettes    Quit date: 03/04/2007    Years since quitting: 13.5   Smokeless tobacco: Never  Vaping Use   Vaping Use: Never used  Substance Use Topics   Alcohol use: Yes    Alcohol/week: 0.0 standard drinks    Comment: occas   Drug use: No    Home Medications Prior to Admission medications   Medication Sig Start Date End Date Taking? Authorizing Provider  amLODipine (NORVASC) 10 MG tablet Take 10 mg by mouth daily.    [provider]  atorvastatin (LIPITOR) 80 MG tablet Take 1 tablet (80 mg total) by mouth daily. 09/18/20 12/17/20  Dunn, Nedra Hai, PA-C  carboxymethylcellulose (REFRESH PLUS) 0.5 % SOLN Place 1 drop into both eyes 3 (three) times daily as needed (dry eyes).    [provider]  carvedilol (COREG) 3.125 MG tablet Take 1 tablet (3.125 mg total) by mouth 2 (two) times daily. 07/18/20 10/16/20  Dunn, Nedra Hai, PA-C  chlorthalidone (HYGROTON) 25 MG tablet Take 25 mg by mouth daily.    [provider]  dapagliflozin propanediol (FARXIGA) 10 MG TABS tablet Take 10 mg by mouth daily.    [provider]  Docusate Sodium (COLACE PO) Take by mouth.    [provider]  LEVEMIR FLEXPEN 100 UNIT/ML SOPN Inject 10-20 Units into the skin daily at 10 pm. If blood sugar > 100 take 20 units, but takes less if lower than 100 05/03/12   [provider]  methimazole (TAPAZOLE) 10 MG tablet Take 5 mg by mouth daily.     [provider]  Multiple Vitamin (MULTIVITAMIN) capsule Take 1 capsule by mouth daily.    [provider]  NOVOLOG FLEXPEN 100 UNIT/ML injection Inject 0-18 Units into the skin 3 (three) times daily with meals. Based on sliding scale 05/03/12   [provider]  OMEPRAZOLE PO Take 20 mg by mouth daily.    [provider]  ONE TOUCH ULTRA TEST test strip  05/11/12   [provider]     Allergies    Ace inhibitors, Benicar [olmesartan], Epinephrine hcl, and Levaquin [levofloxacin in d5w]  Review of Systems   Review of Systems  Unable to perform ROS: Mental status change       Physical Exam Updated Vital Signs Ht 5\' 3"  (1.6 m)   Wt 96 kg   BMI 37.49 kg/m   Physical Exam Constitutional:      Appearance: She is obese.  HENT:     Head: Normocephalic.     Comments: Dried blood around mouth    Nose: Nose normal.  Eyes:     Comments: Left ward gaze  Cardiovascular:     Rate and Rhythm: Normal rate and regular rhythm.  Abdominal:     General: There is no distension.  Neurological:     Mental Status: She is unresponsive.     GCS: GCS eye subscore is 4. GCS verbal subscore is 1. GCS motor subscore is 4.     Cranial Nerves: Cranial nerve deficit and facial asymmetry present.     Motor: Weakness and abnormal muscle tone present.    ED Results / Procedures / Treatments   Labs (all labs ordered are listed, but only abnormal results are displayed) Labs Reviewed  CBC - Abnormal; Notable for the following components:      Result Value   WBC 18.2 (*)    All other components within normal limits  DIFFERENTIAL - Abnormal; Notable for the following components:   Neutro Abs 15.9 (*)    Monocytes Absolute 1.4 (*)    All other components within normal limits  CBG MONITORING, ED - Abnormal; Notable for the following components:   Glucose-Capillary 378 (*)    All other components within normal limits  I-STAT CHEM 8, ED - Abnormal; Notable for the following components:   BUN 33 (*)    Creatinine, Ser 2.60 (*)    Glucose, Bld 384 (*)    Calcium, Ion 1.03 (*)    TCO2 19 (*)    All other components within normal limits  RESP PANEL BY RT-PCR (FLU A&B, COVID) ARPGX2  ETHANOL  PROTIME-INR  APTT  COMPREHENSIVE METABOLIC PANEL  RAPID URINE DRUG SCREEN, HOSP PERFORMED  URINALYSIS, ROUTINE W REFLEX MICROSCOPIC    EKG None  Radiology CT HEAD CODE STROKE WO  CONTRAST  Result Date: 09/27/2020 CLINICAL DATA:  Code stroke.  Neuro deficit, acute stroke suspected. EXAM: CT HEAD WITHOUT CONTRAST TECHNIQUE: Contiguous axial images were obtained from the base of the skull through the vertex without intravenous contrast. COMPARISON:  None. FINDINGS: Brain: Abnormal edema and loss of gray-white differentiation involving the left insula and overlying frontal lobe, concerning for acute left MCA infarct. There is mild local mass effect without midline shift. No evidence of acute hemorrhage. No hydrocephalus. No mass lesion or midline shift. No extra-axial fluid collection. Basal cisterns are patent. Vascular: Hyperdense left MCA vessel in the anterior aspect of the sylvian fissure (series 5, image 39), concerning for thrombosed proximal M2 or distal M1 MCA artery. Calcific atherosclerosis. Skull: No acute fracture. Sinuses/Orbits: Small mild layering fluid in bilateral maxillary sinuses. Other: No mastoid effusions. ASPECTS [facility] Stroke Program Early CT Score) - Ganglionic level infarction (caudate, lentiform nuclei, internal capsule, insula, M1-M3 cortex): 5 - Supraganglionic infarction (M4-M6 cortex): 2 Total score (0-10 with 10 being normal): 7 Dr. Cheral Marker paged at 2:05 PM for call of report, awaiting call back. To avoid delay findings also communicated at 2:08 pm to Dr. Cheral Marker via secure text paging. IMPRESSION: 1. Findings compatible with an acute left MCA territory infarct, as detailed above. ASPECTS is 7. 2. Hyperdense left MCA vessel in the anterior aspect of the sylvian fissure (series 5, image 39), concerning for thrombosed proximal M2 or distal M1 MCA artery. Recommend CTA. 3. No evidence of acute hemorrhage. Electronically Signed   By: Margaretha Sheffield MD   On: 09/27/2020 14:11    Procedures .Critical Care  Date/Time: 09/27/2020 3:56 PM Performed by: Margarita Mail, PA-C Authorized by: Margarita Mail, PA-C   Critical care provider statement:    Critical  care time (minutes):  45   Critical care time was exclusive of:  Separately billable procedures and treating other patients   Critical  care was necessary to treat or prevent imminent or life-threatening deterioration of the following conditions:  CNS failure or compromise   Critical care was time spent personally by me on the following activities:  Discussions with consultants, evaluation of patient's response to treatment, examination of patient, ordering and performing treatments and interventions, ordering and review of laboratory studies, ordering and review of radiographic studies, pulse oximetry, re-evaluation of patient's condition, obtaining history from patient or surrogate and review of old charts   Medications Ordered in ED Medications  iohexol (OMNIPAQUE) 350 MG/ML injection 100 mL (100 mLs Intravenous Contrast Given 09/27/20 1416)    ED Course  I have reviewed the triage vital signs and the nursing notes.  Pertinent labs & imaging results that were available during my care of the patient were reviewed by me and considered in my medical decision making (see chart for details).    MDM Rules/Calculators/A&P                           Patient with large vessel occlusion.  There is no evidence of cervical fracture on CT angio imaging of the neck.  Patient taken emergently to the IR suite thrombectomy. Final Clinical Impression(s) / ED Diagnoses Final diagnoses:  None    Rx / DC Orders ED Discharge Orders     None        Margarita Mail, PA-C 09/27/20 1646    Elnora Morrison, MD 09/27/20 1739

## 2020-09-27 NOTE — Progress Notes (Signed)
NeuroInterventional Radiology  Pre-Procedure Note  History: Per elec note and discussion with Stroke Neurology. 70 yo female, via ED from home, pt seen by neighbors at 10am closing her garage door. Neighbor went to check on her approx 1300 found pt on the floor. Dried blood around her and around her mouth. Upon EMS arrival pt with snoring respirations, bruising and swelling to Right side of her face. Left side facial droop, left side gaze and Right side weakness. LKW 10am  Baseline mRS: 0 NIHSS:  27  CT ASPECTS:   7 CTA:   Poor bolus, with left M1 occlusion CTP:   Shows 6cc core, which is incorrect based on the non-contrast CT. The "at risk" of >40cc is more likely the size of the core infarct.   The "at risk territory" is under-represented on the perfusion, likely by the poor bolus.   Her exam tells [place] the "at risk" territory is greater than 46cc.   I have discussed the case with Dr. Cheral Marker of Stroke Neurology.  Given the patient's symptoms, imaging findings, baseline function, we agree they are an appropriate candidate for attempt for mechanical thrombectomy, as she likely has a much larger "at risk" territory than perfusion demonstrates.    The risks and benefits of the procedure were discussed with the patient's family by our stroke neurology team, with specific risks including: bleeding, infection, arterial injury/dissection, contrast reaction, kidney injury, need for further procedure/surgery, neurologic deficit, 10-15% risk of intracranial hemorrhage, cardiopulmonary collapse, death. All questions were answered.  The patient/family would like to proceed with attempt at thrombectomy.   Plan for cerebral angiogram and attempt at mechanical thrombectomy.   Signed,   Dulcy Fanny. Earleen Newport, DO

## 2020-09-27 NOTE — ED Triage Notes (Signed)
Pt BIB [facility] from home, pt seen by neighbors at 10am closing her garage door. Neighbor went to check on her approx 1300 found pt on the floor. Dried blood around her and around her mouth. Upon EMS arrival pt with snoring respirations, bruising and swelling to Right side of her face. Left side facial droop, left side gaze and Right side weakness.  LKW 10am    BP 160/80 HR 75 RR 22 Unable to pick up pulse ox  CBG 280  18g LAC

## 2020-09-27 NOTE — Progress Notes (Signed)
Pt intubated and under the care of anesthesia °

## 2020-09-27 NOTE — Progress Notes (Signed)
Report given to Lovena Le, RN PACU. Groin level 0 at hand off, vitals stable.

## 2020-09-27 NOTE — Consult Note (Addendum)
NEURO HOSPITALIST H&P   Requesting physician: Margarita Mail, PA  Reason for Consult: Acute onset of right hemiplegia, right facial droop, leftward gaze and global aphasia  History obtained from:  EMS, neighbor, sister, and Chart     HPI:                                                                                                                                          [name] is an 70 y.o. female with a PMHx of bilateral carotid artery plaque (1-39%), CKD4, DM, HLD, CdHF, HTN, heart disease, hyperthyroidism, morbid obesity, OSA on CPAP, proliferative retinopathy, pulmonary HTN and right sided heart failure who presents from home via EMS as a Code Stroke with acute onset of right hemiplegia, right facial droop, leftward gaze and global aphasia. Due to conflicting stories on LKW, NP spoke to patient's sister in [place], then to patient's neighbor who found patient today at 1300 hours. 09/26/20 around 1700 hours, a neighbor spoke with patient and patient was her normal self at that time. Later, in the pm, neighbor called patient again, but this time there was no answer. Patient did not answer her phone this morning either, so her neighbor walked over to patient's home. Neighbor looked in windows and saw patient lying in the dining room floor with dried blood on her face and on the floor surrounding patient. 911 was called. Patient was found to be globally aphasic with left sided gaze preference, right sided weakness, and not following commands, with dried blood around mouth and unable to speak, with right sided weakness. BP per EMS was 163/54. CBG 256.   On arrival to the ED, the patient continued to exhibit the above deficits. After exam at the ED bridge for airway clearance, patient was taken emergently to the CT suite.   CT head revealed a clearly defined hypodensity within the perisylvian region on the left, consistent with an early subacute stroke. ASPECTS 7. Also, noted  was hyperdense distal M1/proximal M2, suggestive of occlusion. CTA head revealed loss of opacification of the distal left M1 or proximal M2 in the anterior aspect of the left sylvian fissure which correlated with the hyperdensity seen on same day noncontrast head CT and was concerning for a high-grade stenosis or occlusion.. CTP showed 6 cc area of detected core infarct in the anterior left MCA territory which also correlated with hypodensity seen on CT; there was 37 cc of detected penumbra. Patient out of window for tPA.   Patient was still in window for IR procedure. Patient's sister in [place] was called. No history of stroke or seizure. Risks vs benefits of IR procedure discussed with sister by Dr. Cheral Marker. Consent given by sister and witnessed by NP. Patient was taken urgently to IR suite.  Past Medical History:  Diagnosis Date   Carotid artery plaque    duplex 07/2020 1-39% BICA   CKD (chronic kidney disease), stage IV (HCC)    Complication of anesthesia    pt states stopped breathing when having tonsillectomy at age 62; pt states has had anesthesia since then without difficulty    Diabetes mellitus    Glaucoma    Heart disease    Hyperlipidemia    Hypertension    Hyperthyroidism    Menopause    Mixed hyperlipidemia 09/22/2013   Morbid obesity ([place])    Multinodular goiter    AND HYPERTHYROIDISM    Obesity (BMI 30-39.9) 04/30/2015   OSA on CPAP    Proliferative retinopathy    moderate, Dr. Posey Pronto   Proteinuria    Dr. Hassell Done   Pulmonary hypertension ([place])    PVC's (premature ventricular contractions)    Right-sided heart failure ([place])    Sinus bradycardia    Trigger finger    Dr. Rip Harbour   Past Surgical History:  Procedure Laterality Date   GASTRIC BANDING PORT REVISION N/A 02/27/2015   Procedure: GASTRIC BANDING PORT REVISION;  Surgeon: Johnathan Hausen, MD;  Location: [facility];  Service: General;  Laterality: N/A;   Hall GASTRIC BANDING  2010   NERVE SURGERY  2001   SPLIT  NIGHT STUDY  08/03/2015   TONSILLECTOMY  1958    Family History  Problem Relation Age of Onset   Heart attack Father             Social History:  reports that she quit smoking about 13 years ago. Her smoking use included cigarettes. She has a 30.00 pack-year smoking history. She has never used smokeless tobacco. She reports current alcohol use. She reports that she does not use drugs.  Allergies  Allergen Reactions   Ace Inhibitors Swelling    Mouth area   Benicar [Olmesartan] Other (See Comments)    Angioedema   Epinephrine Hcl     Hives    Levaquin [Levofloxacin In D5w]     angioedema    MEDICATIONS:                                                                                                                     Prior to Admission: Norvasc 10mg  po qd, Lipitor 80mg  qd (stopped due to side effect of severe diarrhea), Coreg 3.125 po bid, Hygroton 25mg  po qd, Levemir, Tapazole, MVI, Novolog SSI, Omeprazole 20mg  po qd, Farxiga 10 mg po qd  ROS:  Unable to perform a robust ROS due to patient's mental status.   General Examination:                                                                                                       Physical Exam  HEENT-  Normocephalic, no lesions, without obvious abnormality.  Normal external eye and conjunctiva. Dried blood to lips and inside mouth.   Cardiovascular- S1-S2 audible, pulses palpable throughout   Lungs-no rhonchi or wheezing noted, no excessive working breathing.  Saturations within normal limits Abdomen- All 4 quadrants palpated and nontender Extremities- Warm, dry and intact Musculoskeletal-no joint tenderness, deformity or swelling Skin-warm and dry, no hyperpigmentation, vitiligo, or suspicious lesions  1a Level of Conscious: 0 1b LOC Questions: 2 1c LOC Commands: 2 2 Best Gaze: 2 3 Visual: 1 4  Facial Palsy: 1 5a Motor Arm - left: 2 5b Motor Arm - Right: 4 6a Motor Leg - Left: 3 6b Motor Leg - Right: 3 7 Limb Ataxia: 0 8 Sensory: 0 9 Best Language: 3 10 Dysarthria: 2 11 Extinct. and Inatten.: 2 TOTAL: 27  Neurological Examination Mental Status: Patient has eyes open, but is non verbal and follows no commands. Global aphasia noted. Left gaze preference.  Cranial Nerves: II: PERRL.  III,IV, VI: ptosis not present, left gaze preference unable to be overcome.  V,VII: face with right facial droop at rest.   VIII: hearing normal bilaterally IX,X: will not open mouth.  XI: unable to follow command.  XII: will not open mouth.  Motor/Sensory: RUE: No spontaneous movement nor movement to noxious stimuli.    LUE: Moves spontaneously.  RLE: withdraws to pain.   LLE: moves with noxious stimuli and spontaneously.  Tone and bulk:normal tone throughout; no atrophy noted Plantars: Right: downgoing   Left: downgoing Cerebellar: Unable to test due to aphasia. No gross left sided ataxia noted. Other: No tremors, jerking, or twitching noted.    Lab Results: Basic Metabolic Panel: Glucose 858. Creat 2.60.   CBC: WBCC 18.2. Hgb 13.6. Hct 40.  INR 1.1. aPTT 29.  Ethanol < 10.   Imaging:  CTA head and neck: Markedly limited study due to poor contrast timing; however, there is loss of opacification of the distal left M1 or proximal M2 MCA in the anterior aspect of the left sylvian fissure (series 10, images 85 through 88) which correlates with the hyperdensity seen on same day noncontrast head CT and is concerning for a high-grade stenosis or occlusion. The remainder of the CTA is essentially nondiagnostic in the head and neck due to non arterial timing. A repeat bolus or repeat CTA at a later time could further evaluate if clinically indicated. Tree in bud consolidation in the visualized right upper lobe, which may represent aspiration (particularly given fluid layering in the  esophagus) and/or pneumonia. Recommend dedicated chest imaging.   CT Perfusion:  Approximately 6 mL area of detected core infarct in the anterior left MCA territory which correlates with the hypodensity seen on same day CT head. In the more posterior left MCA territory there  is approximately 37 mL of detected penumbra.  Assessment: 70 year old female presenting with acute right hemiplegia, right facial droop, leftward gaze and global aphasia.  1. Exam reveals right facial droop, left gaze preference, global aphasia, and right hemiplegia.  2. CT head reveals clearly defined hypodensity within the perisylvian region on the left, consistent with an early subacute stroke.  3. CTA head and neck: loss of opacification of the distal left M1 or proximal M2 MCA in the anterior aspect of the left sylvian fissure (series 10, images 85 through 88) which correlates with the hyperdensity seen on same day noncontrast head CT and is concerning for a high-grade stenosis or occlusion. 4. CTP with 6 cc core and 37 cc penumbra.  5. The patient is a candidate for endovascular thrombectomy. Discussed extensively the risks/benefits of thrombectomy treatment vs. no treatment with the patient's sister, including approximate 10% risk of SAH with thrombectomy, which carries significant chance for death or long morbidity, versus worse overall outcomes on average in patients within thrombectomy 24 hour time window who do not undergo the procedure. Overall benefits of thrombectomy regarding long-term prognosis are felt to outweigh risks. The patient's sister expressed understanding and wish to proceed with thrombectomy. Consent form signed.  Recommendations: -Following VIR, Neurology will admit to ICU.  -BP goals per IR.  - HgbA1c, fasting lipid panel.  - LDL goal < 70.  - Frequent neuro checks - follow NIHSS. - Echocardiogram. - SCDs. - Hold antiplatelet therapy for now. Repeat CT head in 24 hours.  - Risk factor  modification - cardiac telemetry monitoring for arrhythmia. - PT consult, OT consult, Speech consult - stroke education - SSI - Stroke team to follow - The patient has had side effects in the recent past from statin medication of severe diarrhea. Will hold off on statin for now.  - CXR  65 minutes spent in the emergent neurological evaluation and management of this critically ill patient.   Electronically signed: Dr. Kerney Elbe 09/27/2020, 1:57 PM

## 2020-09-27 NOTE — Transfer of Care (Signed)
Immediate Anesthesia Transfer of Care Note  Patient: Kathleen [name]  Procedure(s) Performed: IR WITH ANESTHESIA  Patient Location: PACU  Anesthesia Type:General  Level of Consciousness: drowsy and patient cooperative  Airway & Oxygen Therapy: Patient Spontanous Breathing and Patient connected to face mask oxygen  Post-op Assessment: Report given to RN and Post -op Vital signs reviewed and stable  Post vital signs: Reviewed and stable  Last Vitals:  Vitals Value Taken Time  BP 121/69 09/27/20 1610  Temp 36.4 C 09/27/20 1610  Pulse 86 09/27/20 1617  Resp 18 09/27/20 1617  SpO2 94 % 09/27/20 1617  Vitals shown include unvalidated device data.  Last Pain: There were no vitals filed for this visit.       Complications: No notable events documented.

## 2020-09-28 ENCOUNTER — Inpatient Hospital Stay (HOSPITAL_COMMUNITY): Payer: Medicare Other

## 2020-09-28 ENCOUNTER — Encounter (HOSPITAL_COMMUNITY): Payer: Self-pay | Admitting: Radiology

## 2020-09-28 DIAGNOSIS — G4733 Obstructive sleep apnea (adult) (pediatric): Secondary | ICD-10-CM

## 2020-09-28 DIAGNOSIS — Z9989 Dependence on other enabling machines and devices: Secondary | ICD-10-CM

## 2020-09-28 DIAGNOSIS — L8990 Pressure ulcer of unspecified site, unspecified stage: Secondary | ICD-10-CM | POA: Diagnosis present

## 2020-09-28 DIAGNOSIS — J9601 Acute respiratory failure with hypoxia: Secondary | ICD-10-CM

## 2020-09-28 DIAGNOSIS — I639 Cerebral infarction, unspecified: Secondary | ICD-10-CM | POA: Diagnosis not present

## 2020-09-28 DIAGNOSIS — I63412 Cerebral infarction due to embolism of left middle cerebral artery: Secondary | ICD-10-CM | POA: Diagnosis not present

## 2020-09-28 DIAGNOSIS — I6389 Other cerebral infarction: Secondary | ICD-10-CM | POA: Diagnosis not present

## 2020-09-28 LAB — URINALYSIS, ROUTINE W REFLEX MICROSCOPIC
Bilirubin Urine: NEGATIVE
Glucose, UA: >=500 mg/dL — AB
Hgb urine dipstick: NEGATIVE
Ketones, ur: 5 mg/dL — AB
Leukocytes,Ua: NEGATIVE
Nitrite: NEGATIVE
Protein, ur: >=300 mg/dL — AB
Specific Gravity, Urine: 1.022 (ref 1.005–1.030)
pH: 5 (ref 5.0–8.0)

## 2020-09-28 LAB — ECHOCARDIOGRAM COMPLETE
AR max vel: 1.44 cm2
AV Area VTI: 1.59 cm2
AV Area mean vel: 1.47 cm2
AV Mean grad: 13 mmHg
AV Peak grad: 26.4 mmHg
Ao pk vel: 2.57 m/s
Area-P 1/2: 2.6 cm2
Height: 63 in
MV VTI: 2.12 cm2
S' Lateral: 3.1 cm
Weight: 3386.27 oz

## 2020-09-28 LAB — BASIC METABOLIC PANEL
Anion gap: 14 (ref 5–15)
BUN: 36 mg/dL — ABNORMAL HIGH (ref 8–23)
CO2: 25 mmol/L (ref 22–32)
Calcium: 8.6 mg/dL — ABNORMAL LOW (ref 8.9–10.3)
Chloride: 100 mmol/L (ref 98–111)
Creatinine, Ser: 2.63 mg/dL — ABNORMAL HIGH (ref 0.44–1.00)
GFR, Estimated: 19 mL/min — ABNORMAL LOW (ref >60)
Glucose, Bld: 113 mg/dL — ABNORMAL HIGH (ref 70–99)
Potassium: 3.6 mmol/L (ref 3.5–5.1)
Sodium: 139 mmol/L (ref 135–145)

## 2020-09-28 LAB — POCT I-STAT 7, (LYTES, BLD GAS, ICA,H+H)
Acid-Base Excess: 0 mmol/L (ref 0.0–2.0)
Bicarbonate: 24.3 mmol/L (ref 20.0–28.0)
Calcium, Ion: 1.15 mmol/L (ref 1.15–1.40)
HCT: 46 % (ref 36.0–46.0)
Hemoglobin: 15.6 g/dL — ABNORMAL HIGH (ref 12.0–15.0)
O2 Saturation: 97 %
Patient temperature: 98.8
Potassium: 3.9 mmol/L (ref 3.5–5.1)
Sodium: 138 mmol/L (ref 135–145)
TCO2: 25 mmol/L (ref 22–32)
pCO2 arterial: 36.5 mmHg (ref 32.0–48.0)
pH, Arterial: 7.432 (ref 7.350–7.450)
pO2, Arterial: 89 mmHg (ref 83.0–108.0)

## 2020-09-28 LAB — CBC
HCT: 32 % — ABNORMAL LOW (ref 36.0–46.0)
Hemoglobin: 10.6 g/dL — ABNORMAL LOW (ref 12.0–15.0)
MCH: 29.2 pg (ref 26.0–34.0)
MCHC: 33.1 g/dL (ref 30.0–36.0)
MCV: 88.2 fL (ref 80.0–100.0)
Platelets: 325 10*3/uL (ref 150–400)
RBC: 3.63 MIL/uL — ABNORMAL LOW (ref 3.87–5.11)
RDW: 15.3 % (ref 11.5–15.5)
WBC: 14.3 10*3/uL — ABNORMAL HIGH (ref 4.0–10.5)
nRBC: 0 % (ref 0.0–0.2)

## 2020-09-28 LAB — GLUCOSE, CAPILLARY
Glucose-Capillary: 330 mg/dL — ABNORMAL HIGH (ref 70–99)
Glucose-Capillary: 286 mg/dL — ABNORMAL HIGH (ref 70–99)
Glucose-Capillary: 145 mg/dL — ABNORMAL HIGH (ref 70–99)
Glucose-Capillary: 83 mg/dL (ref 70–99)
Glucose-Capillary: 135 mg/dL — ABNORMAL HIGH (ref 70–99)
Glucose-Capillary: 179 mg/dL — ABNORMAL HIGH (ref 70–99)
Glucose-Capillary: 160 mg/dL — ABNORMAL HIGH (ref 70–99)
Glucose-Capillary: 186 mg/dL — ABNORMAL HIGH (ref 70–99)
Glucose-Capillary: 173 mg/dL — ABNORMAL HIGH (ref 70–99)

## 2020-09-28 LAB — MRSA NEXT GEN BY PCR, NASAL: MRSA by PCR Next Gen: NOT DETECTED

## 2020-09-28 LAB — RAPID URINE DRUG SCREEN, HOSP PERFORMED
Amphetamines: NOT DETECTED
Barbiturates: NOT DETECTED
Benzodiazepines: NOT DETECTED
Cocaine: NOT DETECTED
Opiates: NOT DETECTED
Tetrahydrocannabinol: NOT DETECTED

## 2020-09-28 LAB — HEMOGLOBIN A1C
Hgb A1c MFr Bld: 7.8 % — ABNORMAL HIGH (ref 4.8–5.6)
Mean Plasma Glucose: 177.16 mg/dL

## 2020-09-28 LAB — HIV ANTIBODY (ROUTINE TESTING W REFLEX): HIV Screen 4th Generation wRfx: NONREACTIVE

## 2020-09-28 LAB — MAGNESIUM
Magnesium: 2 mg/dL (ref 1.7–2.4)
Magnesium: 1.9 mg/dL (ref 1.7–2.4)

## 2020-09-28 LAB — PHOSPHORUS
Phosphorus: 5.4 mg/dL — ABNORMAL HIGH (ref 2.5–4.6)
Phosphorus: 5.6 mg/dL — ABNORMAL HIGH (ref 2.5–4.6)

## 2020-09-28 MED ORDER — ASPIRIN 300 MG RE SUPP
300.0000 mg | Freq: Once | RECTAL | Status: AC
  Administered 2020-09-28: 300 mg via RECTAL
  Filled 2020-09-28: qty 1

## 2020-09-28 MED ORDER — ORAL CARE MOUTH RINSE
15.0000 mL | Freq: Two times a day (BID) | OROMUCOSAL | Status: DC
  Administered 2020-09-28 – 2020-10-08 (×22): 15 mL via OROMUCOSAL

## 2020-09-28 MED ORDER — PERFLUTREN LIPID MICROSPHERE
1.0000 mL | INTRAVENOUS | Status: AC | PRN
  Administered 2020-09-28: 3 mL via INTRAVENOUS
  Filled 2020-09-28: qty 10

## 2020-09-28 MED ORDER — SENNOSIDES-DOCUSATE SODIUM 8.6-50 MG PO TABS: 1.0000 | ORAL_TABLET | Freq: Every evening | ORAL | Status: DC | PRN

## 2020-09-28 MED ORDER — METHIMAZOLE 5 MG PO TABS
5.0000 mg | ORAL_TABLET | Freq: Every day | ORAL | Status: DC
  Administered 2020-09-28 – 2020-10-09 (×12): 5 mg
  Filled 2020-09-28 (×12): qty 1

## 2020-09-28 MED ORDER — VITAL HIGH PROTEIN PO LIQD: 1000.0000 mL | ORAL | Status: DC

## 2020-09-28 MED ORDER — FUROSEMIDE 10 MG/ML IJ SOLN
20.0000 mg | Freq: Once | INTRAMUSCULAR | Status: AC
  Administered 2020-09-28: 20 mg via INTRAVENOUS
  Filled 2020-09-28: qty 2

## 2020-09-28 MED ORDER — CHLORHEXIDINE GLUCONATE 0.12 % MT SOLN
15.0000 mL | Freq: Two times a day (BID) | OROMUCOSAL | Status: DC
  Administered 2020-09-28 – 2020-10-09 (×23): 15 mL via OROMUCOSAL
  Filled 2020-09-28 (×16): qty 15

## 2020-09-28 MED ORDER — POTASSIUM CHLORIDE 20 MEQ PO PACK
20.0000 meq | PACK | Freq: Once | ORAL | Status: AC
  Administered 2020-09-28: 20 meq
  Filled 2020-09-28: qty 1

## 2020-09-28 MED ORDER — DIATRIZOATE MEGLUMINE & SODIUM 66-10 % PO SOLN
20.0000 mL | Freq: Once | ORAL | Status: AC
  Administered 2020-09-28: 20 mL
  Filled 2020-09-28: qty 30

## 2020-09-28 MED ORDER — PROSOURCE TF PO LIQD: 45.0000 mL | Freq: Two times a day (BID) | ORAL | Status: DC

## 2020-09-28 MED ORDER — FUROSEMIDE 10 MG/ML IJ SOLN
80.0000 mg | Freq: Once | INTRAMUSCULAR | Status: AC
  Administered 2020-09-28: 80 mg via INTRAVENOUS
  Filled 2020-09-28: qty 8

## 2020-09-28 MED ORDER — VITAL AF 1.2 CAL PO LIQD: 1000.0000 mL | ORAL | Status: DC

## 2020-09-28 MED ORDER — CARVEDILOL 3.125 MG PO TABS
3.1250 mg | ORAL_TABLET | Freq: Two times a day (BID) | ORAL | Status: DC
  Administered 2020-09-28 – 2020-10-01 (×7): 3.125 mg
  Filled 2020-09-28 (×6): qty 1

## 2020-09-28 MED ORDER — VITAL AF 1.2 CAL PO LIQD
1000.0000 mL | ORAL | Status: DC
  Administered 2020-09-28 – 2020-10-01 (×3): 1000 mL

## 2020-09-28 NOTE — Progress Notes (Signed)
Patient unable to wear BIPAP at this time due to patient's inability to remove BIPAP mask in the event of emesis. Patient is tolerating 8L Venti mask at this time. Spo2 96%. No respiratory distress noted. RT will continue to monitor patient as needed.

## 2020-09-28 NOTE — Progress Notes (Signed)
Patient belongings at bedside include: One shirt One bra One pair of underwear One pair of pants One sandal Jewelry (2 pairs of earrings, one single stud, one pair of hoops, 2 bracelets, one necklace) Cash in a coin purse totaling $105.35 (5 20's, 5 1's, 1 quarter, 1 dime), verified with Ave Filter, RN Wallet with DL and miscellaneous banking cards.  At sister's request patient's neighbor Mertha Baars will take wallet, money, and jewelry home for safe-keeping.  Candy Sledge, RN

## 2020-09-28 NOTE — Procedures (Signed)
Cortrak  Person Inserting Tube:  Esaw Dace, RD Tube Type:  Cortrak - 43 inches Tube Size:  10 Tube Location:  Right nare Secured by: Bridle Technique Used to Measure Tube Placement:  Marking at nare/corner of mouth Cortrak Secured At:  50 cm  Cortrak Tube Team Note:  Consult received to place a Cortrak feeding tube.   Pt with hx of gastric lap band surgery with port revision in 2016. Attempted multiple times to advance Cortrak tube but unable to advance past 50 cm. Unable to advance into safe, usable position. RN and MD notified. Plan to bridle tube in place and consult DG Radiology for advancement. RN is aware that [place] until after advancement of Cortrak tube by Radiology   If the tube becomes dislodged please keep the tube and contact the [place] team at [email or web address] (password [email or web address]) for replacement.  If after hours and replacement cannot be delayed, place a NG tube and confirm placement with an abdominal x-ray.    Kerman Passey MS, RDN, LDN, CNSC Registered Dietitian III Clinical Nutrition RD Pager and On-Call Pager Number Located in [place]

## 2020-09-28 NOTE — Progress Notes (Signed)
Referring Physician(s): Dr. Cheral Marker  Supervising Physician: Corrie Mckusick  Patient Status:  [facility] - In-pt  Chief Complaint: L MCA/M1 occlusion  Subjective: Patient found down at home by her neighbor.  CT imaging showed left M1 occlusion. S/p mechanical thrombectomy by Dr. Earleen Newport.  Patient extubated.  Following commands, although delayed.  Moving left side spontaneously.  Decreased movement noted on right, although attempts are made.  Right sided facial droop.  Allergies: Ace inhibitors, Atorvastatin, Benicar [olmesartan], Epinephrine hcl, Levaquin [levofloxacin in d5w], and Losartan  Medications: Prior to Admission medications   Medication Sig Start Date End Date Taking? Authorizing Provider  loperamide (IMODIUM A-D) 2 MG tablet Take 2 mg by mouth 4 (four) times daily as needed for diarrhea or loose stools.   Yes [provider]  omeprazole (PRILOSEC) 20 MG capsule Take 20 mg by mouth daily.   Yes [provider]  amLODipine (NORVASC) 10 MG tablet Take 10 mg by mouth daily.    [provider]  amoxicillin (AMOXIL) 875 MG tablet Take 875 mg by mouth 2 (two) times daily. 09/06/20   [provider]  atorvastatin (LIPITOR) 80 MG tablet Take 1 tablet (80 mg total) by mouth daily. Patient not taking: Reported on 09/27/2020 09/18/20 12/17/20  Charlie Pitter, PA-C  brimonidine (ALPHAGAN) 0.2 % ophthalmic solution Place 1 drop into both eyes 2 (two) times daily.    [provider]  carboxymethylcellulose (REFRESH PLUS) 0.5 % SOLN Place 1 drop into both eyes 3 (three) times daily as needed (dry eyes).    [provider]  carvedilol (COREG) 3.125 MG tablet Take 1 tablet (3.125 mg total) by mouth 2 (two) times daily. 07/18/20 10/16/20  Dunn, Nedra Hai, PA-C  chlorthalidone (HYGROTON) 25 MG tablet Take 12.5 mg by mouth daily.    [provider]  Docusate Sodium (COLACE PO) Take by mouth.    [provider]  FARXIGA 10 MG TABS  tablet Take 10 mg by mouth daily.    [provider]  ketorolac (ACULAR) 0.5 % ophthalmic solution Place 1 drop into the left eye See admin instructions. INSTILL 1 DROP INTO THE LEFT EYE THREE TIMES A DAY- START 5 DAYS PRIOR TO SURGERY AND CONTINUE UNTIL GONE    [provider]  latanoprost (XALATAN) 0.005 % ophthalmic solution Place 1 drop into both eyes at bedtime.    [provider]  LEVEMIR FLEXPEN 100 UNIT/ML SOPN Inject 10-20 Units into the skin daily at 10 pm. If blood sugar > 100 take 20 units, but takes less if lower than 100 05/03/12   [provider]  methimazole (TAPAZOLE) 10 MG tablet Take 5 mg by mouth daily.  Patient not taking: No sig reported    [provider]  methimazole (TAPAZOLE) 5 MG tablet Take 5 mg by mouth daily.    [provider]  Multiple Vitamin (MULTIVITAMIN) capsule Take 1 capsule by mouth daily.    [provider]  NOVOLOG FLEXPEN 100 UNIT/ML injection Inject 0-18 Units into the skin 3 (three) times daily with meals. Based on sliding scale 05/03/12   [provider]  ONE TOUCH ULTRA TEST test strip  05/11/12   [provider]  prednisoLONE acetate (PRED FORTE) 1 % ophthalmic suspension as directed.    [provider]  spironolactone (ALDACTONE) 25 MG tablet Take 12.5 mg by mouth daily.    [provider]  timolol (BETIMOL) 0.5 % ophthalmic solution Place 1 drop into both eyes in  the morning.    [provider]     Vital Signs: BP (!) 132/55   Pulse 90   Temp 98.8 F (37.1 C) (Axillary)   Resp 17   Ht 5\' 3"  (1.6 m)   Wt 211 lb 10.3 oz (96 kg)   SpO2 96%   BMI 37.49 kg/m   Physical Exam NAD, alert Neuro:  Alert, follows commands- delayed cognition.  Right eye does not cross midline. Right-sided facial droop.  No verbalization. RUE strength 3/5, mostly flaccid right wrist and fingers.  No observed hand grip strength.  Moving bilateral lower extremities  spontaneously, but does not follow commands consistently during exam to assess strength.  Groin site stable, intact.  No evidence of pseudoaneurysm or hematoma.  PV: Pulses palpable. Bruising/swelling to right great toe and instep.   Imaging: DG Chest 2 View  Result Date: 09/28/2020 CLINICAL DATA:  Pneumonia. EXAM: CHEST - 2 VIEW COMPARISON:  Lung apices from neck CTA 09/27/2020, remote chest radiograph 05/27/2016 FINDINGS: The lateral view is limited as patient could not raise arms above head. Cardiomegaly. Aortic atherosclerosis. Patchy right suprahilar opacity which corresponds to abnormality on recent CT. Limited left lung base assessment due to overlapping structures. No pneumothorax. No evidence of pleural effusion. No pulmonary edema. Thoracic spondylosis. IMPRESSION: 1. Patchy right suprahilar opacity which corresponds to abnormality on recent CT, suspicious for pneumonia. 2. Cardiomegaly.  Aortic Atherosclerosis (ICD10-I70.0). Electronically Signed   By: Keith Rake M.D.   On: 09/28/2020 02:08   MR ANGIO HEAD WO CONTRAST  Result Date: 09/28/2020 CLINICAL DATA:  Follow-up examination for acute stroke. EXAM: MRI HEAD WITHOUT CONTRAST MRA HEAD WITHOUT CONTRAST TECHNIQUE: Multiplanar, multi-echo pulse sequences of the brain and surrounding structures were acquired without intravenous contrast. Angiographic images of the Circle of Willis were acquired using MRA technique without intravenous contrast. COMPARISON:  Comparison made with prior CTs from 09/27/2020. FINDINGS: MRI HEAD FINDINGS Brain: Cerebral volume within normal limits for age. Scattered patchy T2/FLAIR hyperintensity noted within the periventricular and deep white matter both cerebral hemispheres as well as the pons, most consistent with chronic small vessel ischemic disease, mild in nature. Small remote lacunar infarct noted at the right basal ganglia/corona radiata. Moderate-sized area of restricted diffusion seen involving the  left insula and overlying left frontal operculum, consistent with acute left MCA distribution infarct. Minimal patchy involvement of the occipital lobe posteriorly. Evidence for associated petechial hemorrhage without frank intraparenchymal hematoma (series 14, image 33). Localized gyral swelling and edema without significant regional mass effect. No other evidence for acute or subacute ischemia. Gray-white matter differentiation otherwise maintained. No encephalomalacia to suggest chronic cortical infarction elsewhere within the brain. Few scattered chronic micro hemorrhages noted involving the cerebellum and left occipital lobe, nonspecific, but suspected to be hypertensive in nature. No mass lesion or midline shift. No hydrocephalus or extra-axial fluid collection. Pituitary gland suprasellar region within normal limits. Midline structures intact. Vascular: Major intracranial vascular flow voids are maintained. Skull and upper cervical spine: Craniocervical junction within normal limits. Bone marrow signal intensity within normal limits. Mild multifocal swelling noted about the scalp. Sinuses/Orbits: Sequelae of prior bilateral ocular lens replacement. Globes and orbital soft tissues demonstrate no acute finding. Mild scattered mucosal thickening noted throughout the paranasal sinuses. Trace left mastoid effusion noted, of doubtful significance. Inner ear structures grossly normal. Other: None. MRA HEAD FINDINGS Anterior circulation: Examination mildly degraded by motion artifact. Visualized distal cervical segments of the internal carotid arteries are patent with antegrade flow. Petrous segments  patent bilaterally. Scattered atheromatous irregularity within the carotid siphons without hemodynamically significant stenosis. A1 segments patent bilaterally. Normal anterior communicating artery complex. Anterior cerebral arteries patent to their distal aspects without stenosis. No M1 stenosis or occlusion. No  proximal MCA branch occlusion now seen. Distal MCA branches well perfused and symmetric, although demonstrate diffuse small vessel atheromatous irregularity. Posterior circulation: Both V4 segments patent to the vertebrobasilar junction without stenosis. Left vertebral artery slightly dominant. Both PICA origins patent and normal. Basilar patent to its distal aspect without stenosis. Superior cerebellar arteries patent bilaterally. Both PCAs primarily supplied via the basilar and are well perfused to their distal aspects. No intracranial aneurysm. Anatomic variants: None significant. IMPRESSION: MRI HEAD IMPRESSION: 1. Moderate-sized acute left MCA distribution infarct involving the left insula and overlying left frontal operculum. Evidence for associated petechial hemorrhage without frank intraparenchymal hematoma or significant mass effect at this time. 2. Underlying mild chronic microvascular ischemic disease. Small remote right basal ganglia/corona radiata lacunar infarct. MRA HEAD IMPRESSION: 1. Interval revascularization of previously seen proximal left MCA occlusion. No large vessel occlusion now seen. 2. Intracranial atherosclerotic disease without hemodynamically significant or correctable stenosis. Electronically Signed   By: Jeannine Boga M.D.   On: 09/28/2020 02:26   MR BRAIN WO CONTRAST  Result Date: 09/28/2020 CLINICAL DATA:  Follow-up examination for acute stroke. EXAM: MRI HEAD WITHOUT CONTRAST MRA HEAD WITHOUT CONTRAST TECHNIQUE: Multiplanar, multi-echo pulse sequences of the brain and surrounding structures were acquired without intravenous contrast. Angiographic images of the Circle of Willis were acquired using MRA technique without intravenous contrast. COMPARISON:  Comparison made with prior CTs from 09/27/2020. FINDINGS: MRI HEAD FINDINGS Brain: Cerebral volume within normal limits for age. Scattered patchy T2/FLAIR hyperintensity noted within the periventricular and deep white  matter both cerebral hemispheres as well as the pons, most consistent with chronic small vessel ischemic disease, mild in nature. Small remote lacunar infarct noted at the right basal ganglia/corona radiata. Moderate-sized area of restricted diffusion seen involving the left insula and overlying left frontal operculum, consistent with acute left MCA distribution infarct. Minimal patchy involvement of the occipital lobe posteriorly. Evidence for associated petechial hemorrhage without frank intraparenchymal hematoma (series 14, image 33). Localized gyral swelling and edema without significant regional mass effect. No other evidence for acute or subacute ischemia. Gray-white matter differentiation otherwise maintained. No encephalomalacia to suggest chronic cortical infarction elsewhere within the brain. Few scattered chronic micro hemorrhages noted involving the cerebellum and left occipital lobe, nonspecific, but suspected to be hypertensive in nature. No mass lesion or midline shift. No hydrocephalus or extra-axial fluid collection. Pituitary gland suprasellar region within normal limits. Midline structures intact. Vascular: Major intracranial vascular flow voids are maintained. Skull and upper cervical spine: Craniocervical junction within normal limits. Bone marrow signal intensity within normal limits. Mild multifocal swelling noted about the scalp. Sinuses/Orbits: Sequelae of prior bilateral ocular lens replacement. Globes and orbital soft tissues demonstrate no acute finding. Mild scattered mucosal thickening noted throughout the paranasal sinuses. Trace left mastoid effusion noted, of doubtful significance. Inner ear structures grossly normal. Other: None. MRA HEAD FINDINGS Anterior circulation: Examination mildly degraded by motion artifact. Visualized distal cervical segments of the internal carotid arteries are patent with antegrade flow. Petrous segments patent bilaterally. Scattered atheromatous  irregularity within the carotid siphons without hemodynamically significant stenosis. A1 segments patent bilaterally. Normal anterior communicating artery complex. Anterior cerebral arteries patent to their distal aspects without stenosis. No M1 stenosis or occlusion. No proximal MCA branch occlusion now seen. Distal MCA  branches well perfused and symmetric, although demonstrate diffuse small vessel atheromatous irregularity. Posterior circulation: Both V4 segments patent to the vertebrobasilar junction without stenosis. Left vertebral artery slightly dominant. Both PICA origins patent and normal. Basilar patent to its distal aspect without stenosis. Superior cerebellar arteries patent bilaterally. Both PCAs primarily supplied via the basilar and are well perfused to their distal aspects. No intracranial aneurysm. Anatomic variants: None significant. IMPRESSION: MRI HEAD IMPRESSION: 1. Moderate-sized acute left MCA distribution infarct involving the left insula and overlying left frontal operculum. Evidence for associated petechial hemorrhage without frank intraparenchymal hematoma or significant mass effect at this time. 2. Underlying mild chronic microvascular ischemic disease. Small remote right basal ganglia/corona radiata lacunar infarct. MRA HEAD IMPRESSION: 1. Interval revascularization of previously seen proximal left MCA occlusion. No large vessel occlusion now seen. 2. Intracranial atherosclerotic disease without hemodynamically significant or correctable stenosis. Electronically Signed   By: Jeannine Boga M.D.   On: 09/28/2020 02:26   IR CT Head Ltd  Result Date: 09/27/2020 INDICATION: 70 year old female presents with acute stroke, left MCA syndrome. She presents for cerebral angiogram and attempt for mechanical thrombectomy to treat emergent large vessel occlusion. EXAM: ULTRASOUND-GUIDED ACCESS RIGHT COMMON FEMORAL ARTERY LEFT-SIDED CERVICAL AND CEREBRAL ANGIOGRAM MECHANICAL THROMBECTOMY LEFT  MCA DEPLOYMENT OF ANGIO-SEAL FOR HEMOSTASIS COMPARISON:  CT imaging same date MEDICATIONS: None ANESTHESIA/SEDATION: The anesthesia team was present to provide general endotracheal tube anesthesia and for patient monitoring during the procedure. Intubation was performed in biplane room 2. left radial arterial line was performed by the anesthesia team. Interventional neuro radiology nursing staff was also present. CONTRAST:  70 cc Omnipaque 300 FLUOROSCOPY TIME:  Fluoroscopy Time: 14 minutes 54 seconds (779.8 mGy). COMPLICATIONS: None TECHNIQUE: Informed written consent was obtained from the patient's family after a thorough discussion of the procedural risks, benefits and alternatives by our stroke neurology team. Specific risks discussed include: Bleeding, infection, contrast reaction, kidney injury/failure, need for further procedure/surgery, arterial injury or dissection, embolization to new territory, intracranial hemorrhage (10-15% risk), neurologic deterioration, cardiopulmonary collapse, death. All questions were addressed. Maximal Sterile Barrier Technique was utilized including during the procedure including caps, mask, sterile gowns, sterile gloves, sterile drape, hand hygiene and skin antiseptic. A timeout was performed prior to the initiation of the procedure. The anesthesia team was present to provide general endotracheal tube anesthesia and for patient monitoring during the procedure. Interventional neuro radiology nursing staff was also present. FINDINGS: Initial Findings: Aorta: Atherosclerotic plaque of the aortic arch. The origin of the left common carotid artery head acute angulation with atherosclerotic plaque at the origin. Left common carotid artery: Angulation at the CCA origin with atherosclerotic plaque. Left external carotid artery: Patent with antegrade flow. Left internal carotid artery: Normal course caliber and contour of the cervical portion. Vertical and petrous segment patent with  normal course caliber contour. Cavernous segment patent. Clinoid segment patent. Antegrade flow of the ophthalmic artery. Ophthalmic segment patent. Terminus patent. Left MCA: Proximal MCA is patent. There is a proximal origin of the inferior division/temporal branch which remains patent and perfuses the majority of the left temporal lobe. There is also a proximal frontal branch, which remains patent just proximal to the occlusion site, perfusing the pre frontal region. Significant leptomeningeal collaterals on the initial injection, filling significant portion of the MCA territory of the left hemisphere. The occlusion is in the dominant superior division. Within the target artery: TICI 0: No perfusion or antegrade flow beyond site of occlusion. Left ACA: A 1 segment patent. A  2 segment perfuses the left territory. There is some cross flow of the anterior communicating artery, with transient opacification of the right ACA. Completion Findings: Left MCA: After single pass with direct aspiration strategy there is complete restoration of flow through the MCA. Completion: TICI 3: Complete perfusion of the territory affected by the occluded artery Flat panel CT performed demonstrating no evidence of hemorrhage. Mild contrast staining within the left paracentral lobule. PROCEDURE: The anesthesia team was present to provide general endotracheal tube anesthesia and for patient monitoring during the procedure. Intubation was performed in negative pressure [place] in neuro IR holding. Interventional neuro radiology nursing staff was also present. Ultrasound survey of the right inguinal region was performed with images stored and sent to PACs. 11 blade scalpel was used to make a small incision. Blunt dissection was performed with US guidance. A micropuncture needle was used access the right common femoral artery under ultrasound. With excellent arterial blood flow returned, an .018 micro wire was passed through the needle,  observed to enter the abdominal aorta under fluoroscopy. The needle was removed, and a micropuncture sheath was placed over the wire. The inner dilator and wire were removed, and an 035 wire was advanced under fluoroscopy into the abdominal aorta. The sheath was removed and a 25cm 50F straight vascular sheath was placed. The dilator was removed and the sheath was flushed. Sheath was attached to pressurized and heparinized saline bag for constant forward flow. A coaxial system was then advanced over the 035 wire. This included a 95cm 087 "[place]" balloon guide with coaxial 125cm Berenstein diagnostic catheter. This was advanced to the proximal descending thoracic aorta. Wire was then removed. Double flush of the catheter was performed. Catheter was then used to select the left common carotid artery. With the coaxial configuration, glidewire was attempted to navigate into the left common carotid artery origin. We failed to access with this coaxial configuration, which was then removed on the 03/05 diagnostic wire. JB 1 catheter was then advanced on the diagnostic wire to the descending thoracic aorta. Wire was removed and the catheter was double flushed. Catheter was then used to engage the left common carotid artery origin. Standard Glidewire was advanced into the distal left common carotid artery and the JB 1 was advanced on the wire. Wire was removed with gentle contrast injection confirming location within the external carotid artery. The catheter was withdrawn into the common carotid artery with the Glidewire used to select the left ICA. Once the JB 1 catheter was advanced on the Glidewire into a distal cervical ICA position, the Glidewire was removed. An exchange length roadrunner wire was then placed. Catheter was removed. The coaxial parents teen catheter and wall wrist balloon guide were then advanced on the roadrunner wire to the distal ICA. With a distal position of the balloon guide achieved, the parents  teen catheter and wire were removed with constant aspiration at the hub. Aspiration was performed at the hub of the catheter. Angiogram was performed. Road map function was used once the occluded vessel was identified. Copious back flush was performed and the balloon catheter was attached to heparinized and pressurized saline bag for forward flow. A second coaxial system was then advanced through the balloon catheter, which included the selected intermediate catheter, microcatheter, and microwire. In this scenario, the set up included a zoom 55 137 cm intermediate catheter, a Trevo Provue18 microcatheter, and 014 synchro soft wire. This system was advanced through the balloon guide catheter under the road-map function, with  adequate back-flush at the rotating hemostatic valve at that back end of the balloon guide. Microcatheter and the intermediate catheter system were advanced through the terminal ICA and MCA to the level of the occlusion. Microwire and catheter did not cross the occluded segment. The aspiration catheter was then advanced into the proximal MCA. Microcatheter and wire were removed with constant drip at the hub. The proprietary engine was then attached to the hub of the zoom catheter. Flow was confirmed and then the catheter was advanced into the occluded segment, observing cessation of flow. Catheter was then removed from the balloon guide under constant aspiration. Free aspiration was confirmed at the hub of the balloon guide catheter, with free blood return confirmed. Control angiogram was performed. Restoration of flow was confirmed. Angiogram of the cervical ICA was performed. Balloon guide was then removed. The skin at the puncture site was then cleaned with Chlorhexidine. The 8 French sheath was removed and a 7 [place] sheath was placed. 2 minutes of manual pressure were observed and then a 6 [place] Angio-Seal was deployed. This maneuver was performed, given that we have no stock available of 8  French Angio-Seal for hemostasis device. Flat panel CT was performed. Patient was extubated once the CT was reviewed and a negative COVID test was confirmed. Patient tolerated the procedure well and remained hemodynamically stable throughout. No complications were encountered and no significant blood loss encountered. IMPRESSION: Status post ultrasound guided access right common femoral artery for left-sided cervical/cerebral angiogram and mechanical thrombectomy of left MCA occlusion, achieving TICI 3 flow. Angio-Seal for hemostasis. Signed, Dulcy Fanny. Dellia Nims, RPVI [facility] PLAN: ICU status Target systolic blood pressure of 120-140 Right hip straight time 6 hours Frequent neurovascular checks Repeat neurologic imaging with CT and/MRI at the discretion of neurology team Electronically Signed   By: Corrie Mckusick D.O.   On: 09/27/2020 17:03   IR US Guide Vasc Access Right  Result Date: 09/27/2020 INDICATION: 70 year old female presents with acute stroke, left MCA syndrome. She presents for cerebral angiogram and attempt for mechanical thrombectomy to treat emergent large vessel occlusion. EXAM: ULTRASOUND-GUIDED ACCESS RIGHT COMMON FEMORAL ARTERY LEFT-SIDED CERVICAL AND CEREBRAL ANGIOGRAM MECHANICAL THROMBECTOMY LEFT MCA DEPLOYMENT OF ANGIO-SEAL FOR HEMOSTASIS COMPARISON:  CT imaging same date MEDICATIONS: None ANESTHESIA/SEDATION: The anesthesia team was present to provide general endotracheal tube anesthesia and for patient monitoring during the procedure. Intubation was performed in biplane room 2. left radial arterial line was performed by the anesthesia team. Interventional neuro radiology nursing staff was also present. CONTRAST:  70 cc Omnipaque 300 FLUOROSCOPY TIME:  Fluoroscopy Time: 14 minutes 54 seconds (779.8 mGy). COMPLICATIONS: None TECHNIQUE: Informed written consent was obtained from the patient's family after a thorough discussion of  the procedural risks, benefits and alternatives by our stroke neurology team. Specific risks discussed include: Bleeding, infection, contrast reaction, kidney injury/failure, need for further procedure/surgery, arterial injury or dissection, embolization to new territory, intracranial hemorrhage (10-15% risk), neurologic deterioration, cardiopulmonary collapse, death. All questions were addressed. Maximal Sterile Barrier Technique was utilized including during the procedure including caps, mask, sterile gowns, sterile gloves, sterile drape, hand hygiene and skin antiseptic. A timeout was performed prior to the initiation of the procedure. The anesthesia team was present to provide general endotracheal tube anesthesia and for patient monitoring during the procedure. Interventional neuro radiology nursing staff was also present. FINDINGS: Initial Findings: Aorta: Atherosclerotic plaque of the aortic arch. The origin of the left common carotid artery head acute  angulation with atherosclerotic plaque at the origin. Left common carotid artery: Angulation at the CCA origin with atherosclerotic plaque. Left external carotid artery: Patent with antegrade flow. Left internal carotid artery: Normal course caliber and contour of the cervical portion. Vertical and petrous segment patent with normal course caliber contour. Cavernous segment patent. Clinoid segment patent. Antegrade flow of the ophthalmic artery. Ophthalmic segment patent. Terminus patent. Left MCA: Proximal MCA is patent. There is a proximal origin of the inferior division/temporal branch which remains patent and perfuses the majority of the left temporal lobe. There is also a proximal frontal branch, which remains patent just proximal to the occlusion site, perfusing the pre frontal region. Significant leptomeningeal collaterals on the initial injection, filling significant portion of the MCA territory of the left hemisphere. The occlusion is in the dominant  superior division. Within the target artery: TICI 0: No perfusion or antegrade flow beyond site of occlusion. Left ACA: A 1 segment patent. A 2 segment perfuses the left territory. There is some cross flow of the anterior communicating artery, with transient opacification of the right ACA. Completion Findings: Left MCA: After single pass with direct aspiration strategy there is complete restoration of flow through the MCA. Completion: TICI 3: Complete perfusion of the territory affected by the occluded artery Flat panel CT performed demonstrating no evidence of hemorrhage. Mild contrast staining within the left paracentral lobule. PROCEDURE: The anesthesia team was present to provide general endotracheal tube anesthesia and for patient monitoring during the procedure. Intubation was performed in negative pressure [place] in neuro IR holding. Interventional neuro radiology nursing staff was also present. Ultrasound survey of the right inguinal region was performed with images stored and sent to PACs. 11 blade scalpel was used to make a small incision. Blunt dissection was performed with US guidance. A micropuncture needle was used access the right common femoral artery under ultrasound. With excellent arterial blood flow returned, an .018 micro wire was passed through the needle, observed to enter the abdominal aorta under fluoroscopy. The needle was removed, and a micropuncture sheath was placed over the wire. The inner dilator and wire were removed, and an 035 wire was advanced under fluoroscopy into the abdominal aorta. The sheath was removed and a 25cm 68F straight vascular sheath was placed. The dilator was removed and the sheath was flushed. Sheath was attached to pressurized and heparinized saline bag for constant forward flow. A coaxial system was then advanced over the 035 wire. This included a 95cm 087 "Walrus" balloon guide with coaxial 125cm Berenstein diagnostic catheter. This was advanced to the proximal  descending thoracic aorta. Wire was then removed. Double flush of the catheter was performed. Catheter was then used to select the left common carotid artery. With the coaxial configuration, glidewire was attempted to navigate into the left common carotid artery origin. We failed to access with this coaxial configuration, which was then removed on the 03/05 diagnostic wire. JB 1 catheter was then advanced on the diagnostic wire to the descending thoracic aorta. Wire was removed and the catheter was double flushed. Catheter was then used to engage the left common carotid artery origin. Standard Glidewire was advanced into the distal left common carotid artery and the JB 1 was advanced on the wire. Wire was removed with gentle contrast injection confirming location within the external carotid artery. The catheter was withdrawn into the common carotid artery with the Glidewire used to select the left ICA. Once the JB 1 catheter was advanced on the Glidewire into  a distal cervical ICA position, the Glidewire was removed. An exchange length roadrunner wire was then placed. Catheter was removed. The coaxial parents teen catheter and wall wrist balloon guide were then advanced on the roadrunner wire to the distal ICA. With a distal position of the balloon guide achieved, the parents teen catheter and wire were removed with constant aspiration at the hub. Aspiration was performed at the hub of the catheter. Angiogram was performed. Road map function was used once the occluded vessel was identified. Copious back flush was performed and the balloon catheter was attached to heparinized and pressurized saline bag for forward flow. A second coaxial system was then advanced through the balloon catheter, which included the selected intermediate catheter, microcatheter, and microwire. In this scenario, the set up included a zoom 55 137 cm intermediate catheter, a Trevo Provue18 microcatheter, and 014 synchro soft wire. This system  was advanced through the balloon guide catheter under the road-map function, with adequate back-flush at the rotating hemostatic valve at that back end of the balloon guide. Microcatheter and the intermediate catheter system were advanced through the terminal ICA and MCA to the level of the occlusion. Microwire and catheter did not cross the occluded segment. The aspiration catheter was then advanced into the proximal MCA. Microcatheter and wire were removed with constant drip at the hub. The proprietary engine was then attached to the hub of the zoom catheter. Flow was confirmed and then the catheter was advanced into the occluded segment, observing cessation of flow. Catheter was then removed from the balloon guide under constant aspiration. Free aspiration was confirmed at the hub of the balloon guide catheter, with free blood return confirmed. Control angiogram was performed. Restoration of flow was confirmed. Angiogram of the cervical ICA was performed. Balloon guide was then removed. The skin at the puncture site was then cleaned with Chlorhexidine. The 8 French sheath was removed and a 7 [place] sheath was placed. 2 minutes of manual pressure were observed and then a 6 [place] Angio-Seal was deployed. This maneuver was performed, given that we have no stock available of 8 French Angio-Seal for hemostasis device. Flat panel CT was performed. Patient was extubated once the CT was reviewed and a negative COVID test was confirmed. Patient tolerated the procedure well and remained hemodynamically stable throughout. No complications were encountered and no significant blood loss encountered. IMPRESSION: Status post ultrasound guided access right common femoral artery for left-sided cervical/cerebral angiogram and mechanical thrombectomy of left MCA occlusion, achieving TICI 3 flow. Angio-Seal for hemostasis. Signed, Dulcy Fanny. Dellia Nims, RPVI [facility] Radiology PLAN:  ICU status Target systolic blood pressure of 120-140 Right hip straight time 6 hours Frequent neurovascular checks Repeat neurologic imaging with CT and/MRI at the discretion of neurology team Electronically Signed   By: Corrie Mckusick D.O.   On: 09/27/2020 17:03   CT CEREBRAL PERFUSION W CONTRAST  Result Date: 09/27/2020 CLINICAL DATA:  Neuro deficit, acute, stroke suspected; Focal neuro deficit, > 6 hrs, stroke suspected EXAM: CT ANGIOGRAPHY HEAD AND NECK CT PERFUSION BRAIN TECHNIQUE: Multidetector CT imaging of the head and neck was performed using the standard protocol during bolus administration of intravenous contrast. Multiplanar CT image reconstructions and MIPs were obtained to evaluate the vascular anatomy. Carotid stenosis measurements (when applicable) are obtained utilizing NASCET criteria, using the distal internal carotid diameter as the denominator. Multiphase CT imaging of the brain was performed following IV bolus contrast injection. Subsequent parametric perfusion maps were calculated  using RAPID software. CONTRAST:  183mL OMNIPAQUE IOHEXOL 350 MG/ML SOLN COMPARISON:  None. FINDINGS: CTA NECK FINDINGS Aortic arch: Only the superior most aspect of the aortic arch is imaged. Centrally nondiagnostic evaluation. Right carotid system: Nondiagnostic evaluation due to non arterial timing. Calcific atherosclerosis at the bifurcation. Left carotid system: Nondiagnostic evaluation due to non arterial timing. Calcific atherosclerosis at the bifurcation. Vertebral arteries: Nondiagnostic evaluation due to non arterial timing. Calcific atherosclerosis of the intradural vertebral arteries. Skeleton: Moderate to severe degenerative disease at C5-C6. Other neck: Heterogeneously enlarged thyroid gland with 2.5 cm left thyroid nodule. This has been evaluated on previous imaging. (ref: J Am Coll Radiol. 2015 Feb;12(2): 143-50). Upper chest: Tree in bud consolidation in the visualized right upper lobe. Review of  the MIP images confirms the above findings CTA HEAD FINDINGS Markedly degraded study due to poor contrast timing. Anterior circulation: Markedly degraded study due to poor contrast timing; however, there is loss of opacification of the distal left M1 or proximal M2 MCA in the anterior aspect of the sylvian fissure (series 10, images 85 through 88) which correlates with the hyperdensity seen on same day noncontrast head CT and is concerning for a high-grade stenosis or occlusion. Otherwise, severely limited evaluation of the anterior circulation. The intracranial ICAs appear to be opacified with atherosclerotic narrowing. Right M1 MCA and A1 ACA vessels appear to be opacified. Evaluation of the patency more distal ACA and MCA vessels is nondiagnostic for patency. Nondiagnostic evaluation of all vessels for stenosis. Posterior circulation: Grossly patent vertebral arteries and basilar artery. Proximal P1 PCAs appear opacified with essentially nondiagnostic evaluation for patency more distal PCAs. Nondiagnostic evaluation of all vessels for stenosis. Venous sinuses: As permitted by contrast timing, patent. Review of the MIP images confirms the above findings CT Brain Perfusion Findings: ASPECTS: 7 CBF (<30%) Volume: 27mL Perfusion (Tmax>6.0s) volume: 66mL Mismatch Volume: 64mL Infarction Location:Anterior left MCA territory, correlating with the hypodensity seen on same day CT head. IMPRESSION: CTA: 1. Markedly limited study due to poor contrast timing; however, there is loss of opacification of the distal left M1 or proximal M2 MCA in the anterior aspect of the left sylvian fissure (series 10, images 85 through 88) which correlates with the hyperdensity seen on same day noncontrast head CT and is concerning for a high-grade stenosis or occlusion. 2. The remainder of the CTA is essentially nondiagnostic in the head and neck due to non arterial timing. A repeat bolus or repeat CTA at a later time could further evaluate  if clinically indicated. 3. Tree in bud consolidation in the visualized right upper lobe, which may represent aspiration (particularly given fluid layering in the esophagus) and/or pneumonia. Recommend dedicated chest imaging. CT Perfusion: Approximately 6 mL area of detected core infarct in the anterior left MCA territory which correlates with the hypodensity seen on same day CT head. In the more posterior left MCA territory there is approximately 37 mL of detected penumbra. Critical findings discussed with Dr. Cheral Marker via telephone at 2:15 p.m. Electronically Signed   By: Margaretha Sheffield MD   On: 09/27/2020 14:46   IR PERCUTANEOUS ART THROMBECTOMY/INFUSION INTRACRANIAL INC DIAG ANGIO  Result Date: 09/27/2020 INDICATION: 70 year old female presents with acute stroke, left MCA syndrome. She presents for cerebral angiogram and attempt for mechanical thrombectomy to treat emergent large vessel occlusion. EXAM: ULTRASOUND-GUIDED ACCESS RIGHT COMMON FEMORAL ARTERY LEFT-SIDED CERVICAL AND CEREBRAL ANGIOGRAM MECHANICAL THROMBECTOMY LEFT MCA DEPLOYMENT OF ANGIO-SEAL FOR HEMOSTASIS COMPARISON:  CT imaging same date MEDICATIONS: None ANESTHESIA/SEDATION:  The anesthesia team was present to provide general endotracheal tube anesthesia and for patient monitoring during the procedure. Intubation was performed in biplane room 2. left radial arterial line was performed by the anesthesia team. Interventional neuro radiology nursing staff was also present. CONTRAST:  70 cc Omnipaque 300 FLUOROSCOPY TIME:  Fluoroscopy Time: 14 minutes 54 seconds (779.8 mGy). COMPLICATIONS: None TECHNIQUE: Informed written consent was obtained from the patient's family after a thorough discussion of the procedural risks, benefits and alternatives by our stroke neurology team. Specific risks discussed include: Bleeding, infection, contrast reaction, kidney injury/failure, need for further procedure/surgery, arterial injury or dissection,  embolization to new territory, intracranial hemorrhage (10-15% risk), neurologic deterioration, cardiopulmonary collapse, death. All questions were addressed. Maximal Sterile Barrier Technique was utilized including during the procedure including caps, mask, sterile gowns, sterile gloves, sterile drape, hand hygiene and skin antiseptic. A timeout was performed prior to the initiation of the procedure. The anesthesia team was present to provide general endotracheal tube anesthesia and for patient monitoring during the procedure. Interventional neuro radiology nursing staff was also present. FINDINGS: Initial Findings: Aorta: Atherosclerotic plaque of the aortic arch. The origin of the left common carotid artery head acute angulation with atherosclerotic plaque at the origin. Left common carotid artery: Angulation at the CCA origin with atherosclerotic plaque. Left external carotid artery: Patent with antegrade flow. Left internal carotid artery: Normal course caliber and contour of the cervical portion. Vertical and petrous segment patent with normal course caliber contour. Cavernous segment patent. Clinoid segment patent. Antegrade flow of the ophthalmic artery. Ophthalmic segment patent. Terminus patent. Left MCA: Proximal MCA is patent. There is a proximal origin of the inferior division/temporal branch which remains patent and perfuses the majority of the left temporal lobe. There is also a proximal frontal branch, which remains patent just proximal to the occlusion site, perfusing the pre frontal region. Significant leptomeningeal collaterals on the initial injection, filling significant portion of the MCA territory of the left hemisphere. The occlusion is in the dominant superior division. Within the target artery: TICI 0: No perfusion or antegrade flow beyond site of occlusion. Left ACA: A 1 segment patent. A 2 segment perfuses the left territory. There is some cross flow of the anterior communicating artery,  with transient opacification of the right ACA. Completion Findings: Left MCA: After single pass with direct aspiration strategy there is complete restoration of flow through the MCA. Completion: TICI 3: Complete perfusion of the territory affected by the occluded artery Flat panel CT performed demonstrating no evidence of hemorrhage. Mild contrast staining within the left paracentral lobule. PROCEDURE: The anesthesia team was present to provide general endotracheal tube anesthesia and for patient monitoring during the procedure. Intubation was performed in negative pressure [place] in neuro IR holding. Interventional neuro radiology nursing staff was also present. Ultrasound survey of the right inguinal region was performed with images stored and sent to PACs. 11 blade scalpel was used to make a small incision. Blunt dissection was performed with US guidance. A micropuncture needle was used access the right common femoral artery under ultrasound. With excellent arterial blood flow returned, an .018 micro wire was passed through the needle, observed to enter the abdominal aorta under fluoroscopy. The needle was removed, and a micropuncture sheath was placed over the wire. The inner dilator and wire were removed, and an 035 wire was advanced under fluoroscopy into the abdominal aorta. The sheath was removed and a 25cm 67F straight vascular sheath was placed. The dilator was removed and  the sheath was flushed. Sheath was attached to pressurized and heparinized saline bag for constant forward flow. A coaxial system was then advanced over the 035 wire. This included a 95cm 087 "Walrus" balloon guide with coaxial 125cm Berenstein diagnostic catheter. This was advanced to the proximal descending thoracic aorta. Wire was then removed. Double flush of the catheter was performed. Catheter was then used to select the left common carotid artery. With the coaxial configuration, glidewire was attempted to navigate into the left common  carotid artery origin. We failed to access with this coaxial configuration, which was then removed on the 03/05 diagnostic wire. JB 1 catheter was then advanced on the diagnostic wire to the descending thoracic aorta. Wire was removed and the catheter was double flushed. Catheter was then used to engage the left common carotid artery origin. Standard Glidewire was advanced into the distal left common carotid artery and the JB 1 was advanced on the wire. Wire was removed with gentle contrast injection confirming location within the external carotid artery. The catheter was withdrawn into the common carotid artery with the Glidewire used to select the left ICA. Once the JB 1 catheter was advanced on the Glidewire into a distal cervical ICA position, the Glidewire was removed. An exchange length roadrunner wire was then placed. Catheter was removed. The coaxial parents teen catheter and wall wrist balloon guide were then advanced on the roadrunner wire to the distal ICA. With a distal position of the balloon guide achieved, the parents teen catheter and wire were removed with constant aspiration at the hub. Aspiration was performed at the hub of the catheter. Angiogram was performed. Road map function was used once the occluded vessel was identified. Copious back flush was performed and the balloon catheter was attached to heparinized and pressurized saline bag for forward flow. A second coaxial system was then advanced through the balloon catheter, which included the selected intermediate catheter, microcatheter, and microwire. In this scenario, the set up included a zoom 55 137 cm intermediate catheter, a Trevo Provue18 microcatheter, and 014 synchro soft wire. This system was advanced through the balloon guide catheter under the road-map function, with adequate back-flush at the rotating hemostatic valve at that back end of the balloon guide. Microcatheter and the intermediate catheter system were advanced through  the terminal ICA and MCA to the level of the occlusion. Microwire and catheter did not cross the occluded segment. The aspiration catheter was then advanced into the proximal MCA. Microcatheter and wire were removed with constant drip at the hub. The proprietary engine was then attached to the hub of the zoom catheter. Flow was confirmed and then the catheter was advanced into the occluded segment, observing cessation of flow. Catheter was then removed from the balloon guide under constant aspiration. Free aspiration was confirmed at the hub of the balloon guide catheter, with free blood return confirmed. Control angiogram was performed. Restoration of flow was confirmed. Angiogram of the cervical ICA was performed. Balloon guide was then removed. The skin at the puncture site was then cleaned with Chlorhexidine. The 8 French sheath was removed and a 7 [place] sheath was placed. 2 minutes of manual pressure were observed and then a 6 [place] Angio-Seal was deployed. This maneuver was performed, given that we have no stock available of 8 French Angio-Seal for hemostasis device. Flat panel CT was performed. Patient was extubated once the CT was reviewed and a negative COVID test was confirmed. Patient tolerated the procedure well and remained hemodynamically stable  throughout. No complications were encountered and no significant blood loss encountered. IMPRESSION: Status post ultrasound guided access right common femoral artery for left-sided cervical/cerebral angiogram and mechanical thrombectomy of left MCA occlusion, achieving TICI 3 flow. Angio-Seal for hemostasis. Signed, Dulcy Fanny. Dellia Nims, RPVI [facility] Inter[facility] Radiology Special[facility] Radiology PLAN: ICU status Target systolic blood pressure of 120-140 Right hip straight time 6 hours Frequent neurovascular checks Repeat neurologic imaging with CT and/MRI at the discretion of neurology team Electronically Signed   By: Corrie Mckusick D.O.   On:  09/27/2020 17:03   CT HEAD CODE STROKE WO CONTRAST  Addendum Date: 09/27/2020   ADDENDUM REPORT: 09/27/2020 14:47 ADDENDUM: In addition to the text page detailed in the report, findings also conveyed to Dr. Cheral Marker via telephone at 2:15 p.m. Electronically Signed   By: Margaretha Sheffield MD   On: 09/27/2020 14:47   Result Date: 09/27/2020 CLINICAL DATA:  Code stroke.  Neuro deficit, acute stroke suspected. EXAM: CT HEAD WITHOUT CONTRAST TECHNIQUE: Contiguous axial images were obtained from the base of the skull through the vertex without intravenous contrast. COMPARISON:  None. FINDINGS: Brain: Abnormal edema and loss of gray-white differentiation involving the left insula and overlying frontal lobe, concerning for acute left MCA infarct. There is mild local mass effect without midline shift. No evidence of acute hemorrhage. No hydrocephalus. No mass lesion or midline shift. No extra-axial fluid collection. Basal cisterns are patent. Vascular: Hyperdense left MCA vessel in the anterior aspect of the sylvian fissure (series 5, image 39), concerning for thrombosed proximal M2 or distal M1 MCA artery. Calcific atherosclerosis. Skull: No acute fracture. Sinuses/Orbits: Small mild layering fluid in bilateral maxillary sinuses. Other: No mastoid effusions. ASPECTS [facility] Stroke Program Early CT Score) - Ganglionic level infarction (caudate, lentiform nuclei, internal capsule, insula, M1-M3 cortex): 5 - Supraganglionic infarction (M4-M6 cortex): 2 Total score (0-10 with 10 being normal): 7 Dr. Cheral Marker paged at 2:05 PM for call of report, awaiting call back. To avoid delay findings also communicated at 2:08 pm to Dr. Cheral Marker via secure text paging. IMPRESSION: 1. Findings compatible with an acute left MCA territory infarct, as detailed above. ASPECTS is 7. 2. Hyperdense left MCA vessel in the anterior aspect of the sylvian fissure (series 5, image 39), concerning for thrombosed proximal M2 or distal M1 MCA artery.  Recommend CTA. 3. No evidence of acute hemorrhage. Electronically Signed: By: Margaretha Sheffield MD On: 09/27/2020 14:11   CT ANGIO HEAD CODE STROKE  Result Date: 09/27/2020 CLINICAL DATA:  Neuro deficit, acute, stroke suspected; Focal neuro deficit, > 6 hrs, stroke suspected EXAM: CT ANGIOGRAPHY HEAD AND NECK CT PERFUSION BRAIN TECHNIQUE: Multidetector CT imaging of the head and neck was performed using the standard protocol during bolus administration of intravenous contrast. Multiplanar CT image reconstructions and MIPs were obtained to evaluate the vascular anatomy. Carotid stenosis measurements (when applicable) are obtained utilizing NASCET criteria, using the distal internal carotid diameter as the denominator. Multiphase CT imaging of the brain was performed following IV bolus contrast injection. Subsequent parametric perfusion maps were calculated using RAPID software. CONTRAST:  165mL OMNIPAQUE IOHEXOL 350 MG/ML SOLN COMPARISON:  None. FINDINGS: CTA NECK FINDINGS Aortic arch: Only the superior most aspect of the aortic arch is imaged. Centrally nondiagnostic evaluation. Right carotid system: Nondiagnostic evaluation due to non arterial timing. Calcific atherosclerosis at the bifurcation. Left carotid system: Nondiagnostic evaluation due to non arterial timing. Calcific atherosclerosis at the bifurcation. Vertebral arteries: Nondiagnostic evaluation due to non arterial timing. Calcific  atherosclerosis of the intradural vertebral arteries. Skeleton: Moderate to severe degenerative disease at C5-C6. Other neck: Heterogeneously enlarged thyroid gland with 2.5 cm left thyroid nodule. This has been evaluated on previous imaging. (ref: J Am Coll Radiol. 2015 Feb;12(2): 143-50). Upper chest: Tree in bud consolidation in the visualized right upper lobe. Review of the MIP images confirms the above findings CTA HEAD FINDINGS Markedly degraded study due to poor contrast timing. Anterior circulation: Markedly  degraded study due to poor contrast timing; however, there is loss of opacification of the distal left M1 or proximal M2 MCA in the anterior aspect of the sylvian fissure (series 10, images 85 through 88) which correlates with the hyperdensity seen on same day noncontrast head CT and is concerning for a high-grade stenosis or occlusion. Otherwise, severely limited evaluation of the anterior circulation. The intracranial ICAs appear to be opacified with atherosclerotic narrowing. Right M1 MCA and A1 ACA vessels appear to be opacified. Evaluation of the patency more distal ACA and MCA vessels is nondiagnostic for patency. Nondiagnostic evaluation of all vessels for stenosis. Posterior circulation: Grossly patent vertebral arteries and basilar artery. Proximal P1 PCAs appear opacified with essentially nondiagnostic evaluation for patency more distal PCAs. Nondiagnostic evaluation of all vessels for stenosis. Venous sinuses: As permitted by contrast timing, patent. Review of the MIP images confirms the above findings CT Brain Perfusion Findings: ASPECTS: 7 CBF (<30%) Volume: 99mL Perfusion (Tmax>6.0s) volume: 31mL Mismatch Volume: 52mL Infarction Location:Anterior left MCA territory, correlating with the hypodensity seen on same day CT head. IMPRESSION: CTA: 1. Markedly limited study due to poor contrast timing; however, there is loss of opacification of the distal left M1 or proximal M2 MCA in the anterior aspect of the left sylvian fissure (series 10, images 85 through 88) which correlates with the hyperdensity seen on same day noncontrast head CT and is concerning for a high-grade stenosis or occlusion. 2. The remainder of the CTA is essentially nondiagnostic in the head and neck due to non arterial timing. A repeat bolus or repeat CTA at a later time could further evaluate if clinically indicated. 3. Tree in bud consolidation in the visualized right upper lobe, which may represent aspiration (particularly given fluid  layering in the esophagus) and/or pneumonia. Recommend dedicated chest imaging. CT Perfusion: Approximately 6 mL area of detected core infarct in the anterior left MCA territory which correlates with the hypodensity seen on same day CT head. In the more posterior left MCA territory there is approximately 37 mL of detected penumbra. Critical findings discussed with Dr. Cheral Marker via telephone at 2:15 p.m. Electronically Signed   By: Margaretha Sheffield MD   On: 09/27/2020 14:46   CT ANGIO NECK CODE STROKE  Result Date: 09/27/2020 CLINICAL DATA:  Neuro deficit, acute, stroke suspected; Focal neuro deficit, > 6 hrs, stroke suspected EXAM: CT ANGIOGRAPHY HEAD AND NECK CT PERFUSION BRAIN TECHNIQUE: Multidetector CT imaging of the head and neck was performed using the standard protocol during bolus administration of intravenous contrast. Multiplanar CT image reconstructions and MIPs were obtained to evaluate the vascular anatomy. Carotid stenosis measurements (when applicable) are obtained utilizing NASCET criteria, using the distal internal carotid diameter as the denominator. Multiphase CT imaging of the brain was performed following IV bolus contrast injection. Subsequent parametric perfusion maps were calculated using RAPID software. CONTRAST:  148mL OMNIPAQUE IOHEXOL 350 MG/ML SOLN COMPARISON:  None. FINDINGS: CTA NECK FINDINGS Aortic arch: Only the superior most aspect of the aortic arch is imaged. Centrally nondiagnostic evaluation. Right carotid  system: Nondiagnostic evaluation due to non arterial timing. Calcific atherosclerosis at the bifurcation. Left carotid system: Nondiagnostic evaluation due to non arterial timing. Calcific atherosclerosis at the bifurcation. Vertebral arteries: Nondiagnostic evaluation due to non arterial timing. Calcific atherosclerosis of the intradural vertebral arteries. Skeleton: Moderate to severe degenerative disease at C5-C6. Other neck: Heterogeneously enlarged thyroid gland with  2.5 cm left thyroid nodule. This has been evaluated on previous imaging. (ref: J Am Coll Radiol. 2015 Feb;12(2): 143-50). Upper chest: Tree in bud consolidation in the visualized right upper lobe. Review of the MIP images confirms the above findings CTA HEAD FINDINGS Markedly degraded study due to poor contrast timing. Anterior circulation: Markedly degraded study due to poor contrast timing; however, there is loss of opacification of the distal left M1 or proximal M2 MCA in the anterior aspect of the sylvian fissure (series 10, images 85 through 88) which correlates with the hyperdensity seen on same day noncontrast head CT and is concerning for a high-grade stenosis or occlusion. Otherwise, severely limited evaluation of the anterior circulation. The intracranial ICAs appear to be opacified with atherosclerotic narrowing. Right M1 MCA and A1 ACA vessels appear to be opacified. Evaluation of the patency more distal ACA and MCA vessels is nondiagnostic for patency. Nondiagnostic evaluation of all vessels for stenosis. Posterior circulation: Grossly patent vertebral arteries and basilar artery. Proximal P1 PCAs appear opacified with essentially nondiagnostic evaluation for patency more distal PCAs. Nondiagnostic evaluation of all vessels for stenosis. Venous sinuses: As permitted by contrast timing, patent. Review of the MIP images confirms the above findings CT Brain Perfusion Findings: ASPECTS: 7 CBF (<30%) Volume: 34mL Perfusion (Tmax>6.0s) volume: 64mL Mismatch Volume: 52mL Infarction Location:Anterior left MCA territory, correlating with the hypodensity seen on same day CT head. IMPRESSION: CTA: 1. Markedly limited study due to poor contrast timing; however, there is loss of opacification of the distal left M1 or proximal M2 MCA in the anterior aspect of the left sylvian fissure (series 10, images 85 through 88) which correlates with the hyperdensity seen on same day noncontrast head CT and is concerning for a  high-grade stenosis or occlusion. 2. The remainder of the CTA is essentially nondiagnostic in the head and neck due to non arterial timing. A repeat bolus or repeat CTA at a later time could further evaluate if clinically indicated. 3. Tree in bud consolidation in the visualized right upper lobe, which may represent aspiration (particularly given fluid layering in the esophagus) and/or pneumonia. Recommend dedicated chest imaging. CT Perfusion: Approximately 6 mL area of detected core infarct in the anterior left MCA territory which correlates with the hypodensity seen on same day CT head. In the more posterior left MCA territory there is approximately 37 mL of detected penumbra. Critical findings discussed with Dr. Cheral Marker via telephone at 2:15 p.m. Electronically Signed   By: Margaretha Sheffield MD   On: 09/27/2020 14:46    Labs:  CBC: Recent Labs    09/27/20 1356 09/27/20 1401 09/28/20 0245 09/28/20 1108  WBC 18.2*  --  14.3*  --   HGB 12.2 13.6 10.6* 15.6*  HCT 38.9 40.0 32.0* 46.0  PLT 343  --  325  --     COAGS: Recent Labs    09/27/20 1356  INR 1.1  APTT 29    BMP: Recent Labs    09/11/20 0735 09/17/20 0735 09/27/20 1356 09/27/20 1401 09/28/20 0245 09/28/20 1108  NA CANCELED 140 137 135 139 138  K 5.6* 5.0 4.7 4.5 3.6 3.9  CL 114* 100 97* 101 100  --   CO2 24 29 19*  --  25  --   GLUCOSE 100* 149* 389* 384* 113*  --   BUN 27 32* 35* 33* 36*  --   CALCIUM 9.3 9.6 9.1  --  8.6*  --   CREATININE 2.01* 1.92* 2.78* 2.60* 2.63*  --   GFRNONAA  --   --  18*  --  19*  --     LIVER FUNCTION TESTS: Recent Labs    06/19/20 0751 09/11/20 0735 09/27/20 1356  BILITOT 0.5 0.4 2.9*  AST 20 30 38  ALT 7 9 13   ALKPHOS 94 87 69  PROT 6.2 6.3 6.2*  ALBUMIN 3.5* 3.9 3.2*    Assessment and Plan: Left M1 occlusion s/p mechanical thrombectomy by Dr. Earleen Newport 09/27/20 Patient extubated post-procedure.  Alert, following commands-- delayed with some inconsistency.  Moving right  upper arm, right leg.  Noticeable weakness in wrist and right hand. Right facial droop.  Nonverbal during assessment this AM-- RN states no vocalization yet today.  Groin site stable, intact.  No evidence of pseudoaneurysm or hematoma. Pulses palpable.  Per RN, for XR of right foot today-- possible injury with fall at home. Further plans per Neurology team.  IR remains available if needed.   Electronically Signed: Docia Barrier, PA 09/28/2020, 11:26 AM   I spent a total of 15 Minutes at the the patient's bedside AND on the patient's hospital floor or unit, greater than 50% of which was counseling/coordinating care for left MCA occlusion.

## 2020-09-28 NOTE — Progress Notes (Signed)
PT Cancellation Note  Patient Details Name: SARYN CHERRY MRN: [id number] DOB: 1950/11/03   Cancelled Treatment:    Reason Eval/Treat Not Completed: Patient not medically ready. MD and RN requesting hold this date due to unstable respiratory status at this time. Will plan to follow up tomorrow as appropriate.   Moishe Spice, PT, DPT Acute Rehabilitation Services  Pager: [phone] Office: [place] 09/28/2020, 10:32 AM

## 2020-09-28 NOTE — Progress Notes (Addendum)
STROKE TEAM PROGRESS NOTE   INTERVAL HISTORY Her RN is at the bedside.  She has been having some sob this am and is on 6L [place]. She has OAS and sleeps with Cpap at home, which was not know till this am. She appears to be is some fluid overload, so IVF stopped and Lasix 20mg  given. RT consult placed for CPAP while sleeping during day or night.   Vitals:   09/28/20 0700 09/28/20 0800 09/28/20 0900 09/28/20 1200  BP: (!) 148/62 124/62 (!) 132/55   Pulse: 87 94 90   Resp: 20 16 17    Temp:  98.8 F (37.1 C)  98.8 F (37.1 C)  TempSrc:  Axillary  Axillary  SpO2: 93% 94% 96%   Weight:      Height:       CBC:  Recent Labs  Lab 09/27/20 1356 09/27/20 1401 09/28/20 0245 09/28/20 1108  WBC 18.2*  --  14.3*  --   NEUTROABS 15.9*  --   --   --   HGB 12.2   < > 10.6* 15.6*  HCT 38.9   < > 32.0* 46.0  MCV 93.3  --  88.2  --   PLT 343  --  325  --    < > = values in this interval not displayed.   Basic Metabolic Panel:  Recent Labs  Lab 09/27/20 1356 09/27/20 1401 09/28/20 0245 09/28/20 1108  NA 137 135 139 138  K 4.7 4.5 3.6 3.9  CL 97* 101 100  --   CO2 19*  --  25  --   GLUCOSE 389* 384* 113*  --   BUN 35* 33* 36*  --   CREATININE 2.78* 2.60* 2.63*  --   CALCIUM 9.1  --  8.6*  --    Lipid Panel: No results for input(s): CHOL, TRIG, HDL, CHOLHDL, VLDL, LDLCALC in the last 168 hours. HgbA1c:  Recent Labs  Lab 09/28/20 0245  HGBA1C 7.8*   Urine Drug Screen:  Recent Labs  Lab 09/28/20 0951  LABOPIA NONE DETECTED  COCAINSCRNUR NONE DETECTED  LABBENZ NONE DETECTED  AMPHETMU NONE DETECTED  THCU NONE DETECTED  LABBARB NONE DETECTED    Alcohol Level  Recent Labs  Lab 09/27/20 1356  ETH <10    IMAGING past 24 hours DG Chest 2 View  Result Date: 09/28/2020 CLINICAL DATA:  Pneumonia. EXAM: CHEST - 2 VIEW COMPARISON:  Lung apices from neck CTA 09/27/2020, remote chest radiograph 05/27/2016 FINDINGS: The lateral view is limited as patient could not raise arms above  head. Cardiomegaly. Aortic atherosclerosis. Patchy right suprahilar opacity which corresponds to abnormality on recent CT. Limited left lung base assessment due to overlapping structures. No pneumothorax. No evidence of pleural effusion. No pulmonary edema. Thoracic spondylosis. IMPRESSION: 1. Patchy right suprahilar opacity which corresponds to abnormality on recent CT, suspicious for pneumonia. 2. Cardiomegaly.  Aortic Atherosclerosis (ICD10-I70.0). Electronically Signed   By: Keith Rake M.D.   On: 09/28/2020 02:08   MR ANGIO HEAD WO CONTRAST  Result Date: 09/28/2020 CLINICAL DATA:  Follow-up examination for acute stroke. EXAM: MRI HEAD WITHOUT CONTRAST MRA HEAD WITHOUT CONTRAST TECHNIQUE: Multiplanar, multi-echo pulse sequences of the brain and surrounding structures were acquired without intravenous contrast. Angiographic images of the Circle of Willis were acquired using MRA technique without intravenous contrast. COMPARISON:  Comparison made with prior CTs from 09/27/2020. FINDINGS: MRI HEAD FINDINGS Brain: Cerebral volume within normal limits for age. Scattered patchy T2/FLAIR hyperintensity noted within the periventricular and  deep white matter both cerebral hemispheres as well as the pons, most consistent with chronic small vessel ischemic disease, mild in nature. Small remote lacunar infarct noted at the right basal ganglia/corona radiata. Moderate-sized area of restricted diffusion seen involving the left insula and overlying left frontal operculum, consistent with acute left MCA distribution infarct. Minimal patchy involvement of the occipital lobe posteriorly. Evidence for associated petechial hemorrhage without frank intraparenchymal hematoma (series 14, image 33). Localized gyral swelling and edema without significant regional mass effect. No other evidence for acute or subacute ischemia. Gray-white matter differentiation otherwise maintained. No encephalomalacia to suggest chronic cortical  infarction elsewhere within the brain. Few scattered chronic micro hemorrhages noted involving the cerebellum and left occipital lobe, nonspecific, but suspected to be hypertensive in nature. No mass lesion or midline shift. No hydrocephalus or extra-axial fluid collection. Pituitary gland suprasellar region within normal limits. Midline structures intact. Vascular: Major intracranial vascular flow voids are maintained. Skull and upper cervical spine: Craniocervical junction within normal limits. Bone marrow signal intensity within normal limits. Mild multifocal swelling noted about the scalp. Sinuses/Orbits: Sequelae of prior bilateral ocular lens replacement. Globes and orbital soft tissues demonstrate no acute finding. Mild scattered mucosal thickening noted throughout the paranasal sinuses. Trace left mastoid effusion noted, of doubtful significance. Inner ear structures grossly normal. Other: None. MRA HEAD FINDINGS Anterior circulation: Examination mildly degraded by motion artifact. Visualized distal cervical segments of the internal carotid arteries are patent with antegrade flow. Petrous segments patent bilaterally. Scattered atheromatous irregularity within the carotid siphons without hemodynamically significant stenosis. A1 segments patent bilaterally. Normal anterior communicating artery complex. Anterior cerebral arteries patent to their distal aspects without stenosis. No M1 stenosis or occlusion. No proximal MCA branch occlusion now seen. Distal MCA branches well perfused and symmetric, although demonstrate diffuse small vessel atheromatous irregularity. Posterior circulation: Both V4 segments patent to the vertebrobasilar junction without stenosis. Left vertebral artery slightly dominant. Both PICA origins patent and normal. Basilar patent to its distal aspect without stenosis. Superior cerebellar arteries patent bilaterally. Both PCAs primarily supplied via the basilar and are well perfused to their  distal aspects. No intracranial aneurysm. Anatomic variants: None significant. IMPRESSION: MRI HEAD IMPRESSION: 1. Moderate-sized acute left MCA distribution infarct involving the left insula and overlying left frontal operculum. Evidence for associated petechial hemorrhage without frank intraparenchymal hematoma or significant mass effect at this time. 2. Underlying mild chronic microvascular ischemic disease. Small remote right basal ganglia/corona radiata lacunar infarct. MRA HEAD IMPRESSION: 1. Interval revascularization of previously seen proximal left MCA occlusion. No large vessel occlusion now seen. 2. Intracranial atherosclerotic disease without hemodynamically significant or correctable stenosis. Electronically Signed   By: Jeannine Boga M.D.   On: 09/28/2020 02:26   MR BRAIN WO CONTRAST  Result Date: 09/28/2020 CLINICAL DATA:  Follow-up examination for acute stroke. EXAM: MRI HEAD WITHOUT CONTRAST MRA HEAD WITHOUT CONTRAST TECHNIQUE: Multiplanar, multi-echo pulse sequences of the brain and surrounding structures were acquired without intravenous contrast. Angiographic images of the Circle of Willis were acquired using MRA technique without intravenous contrast. COMPARISON:  Comparison made with prior CTs from 09/27/2020. FINDINGS: MRI HEAD FINDINGS Brain: Cerebral volume within normal limits for age. Scattered patchy T2/FLAIR hyperintensity noted within the periventricular and deep white matter both cerebral hemispheres as well as the pons, most consistent with chronic small vessel ischemic disease, mild in nature. Small remote lacunar infarct noted at the right basal ganglia/corona radiata. Moderate-sized area of restricted diffusion seen involving the left insula and overlying left frontal  operculum, consistent with acute left MCA distribution infarct. Minimal patchy involvement of the occipital lobe posteriorly. Evidence for associated petechial hemorrhage without frank intraparenchymal  hematoma (series 14, image 33). Localized gyral swelling and edema without significant regional mass effect. No other evidence for acute or subacute ischemia. Gray-white matter differentiation otherwise maintained. No encephalomalacia to suggest chronic cortical infarction elsewhere within the brain. Few scattered chronic micro hemorrhages noted involving the cerebellum and left occipital lobe, nonspecific, but suspected to be hypertensive in nature. No mass lesion or midline shift. No hydrocephalus or extra-axial fluid collection. Pituitary gland suprasellar region within normal limits. Midline structures intact. Vascular: Major intracranial vascular flow voids are maintained. Skull and upper cervical spine: Craniocervical junction within normal limits. Bone marrow signal intensity within normal limits. Mild multifocal swelling noted about the scalp. Sinuses/Orbits: Sequelae of prior bilateral ocular lens replacement. Globes and orbital soft tissues demonstrate no acute finding. Mild scattered mucosal thickening noted throughout the paranasal sinuses. Trace left mastoid effusion noted, of doubtful significance. Inner ear structures grossly normal. Other: None. MRA HEAD FINDINGS Anterior circulation: Examination mildly degraded by motion artifact. Visualized distal cervical segments of the internal carotid arteries are patent with antegrade flow. Petrous segments patent bilaterally. Scattered atheromatous irregularity within the carotid siphons without hemodynamically significant stenosis. A1 segments patent bilaterally. Normal anterior communicating artery complex. Anterior cerebral arteries patent to their distal aspects without stenosis. No M1 stenosis or occlusion. No proximal MCA branch occlusion now seen. Distal MCA branches well perfused and symmetric, although demonstrate diffuse small vessel atheromatous irregularity. Posterior circulation: Both V4 segments patent to the vertebrobasilar junction without  stenosis. Left vertebral artery slightly dominant. Both PICA origins patent and normal. Basilar patent to its distal aspect without stenosis. Superior cerebellar arteries patent bilaterally. Both PCAs primarily supplied via the basilar and are well perfused to their distal aspects. No intracranial aneurysm. Anatomic variants: None significant. IMPRESSION: MRI HEAD IMPRESSION: 1. Moderate-sized acute left MCA distribution infarct involving the left insula and overlying left frontal operculum. Evidence for associated petechial hemorrhage without frank intraparenchymal hematoma or significant mass effect at this time. 2. Underlying mild chronic microvascular ischemic disease. Small remote right basal ganglia/corona radiata lacunar infarct. MRA HEAD IMPRESSION: 1. Interval revascularization of previously seen proximal left MCA occlusion. No large vessel occlusion now seen. 2. Intracranial atherosclerotic disease without hemodynamically significant or correctable stenosis. Electronically Signed   By: Jeannine Boga M.D.   On: 09/28/2020 02:26   IR CT Head Ltd  Result Date: 09/27/2020 INDICATION: 70 year old female presents with acute stroke, left MCA syndrome. She presents for cerebral angiogram and attempt for mechanical thrombectomy to treat emergent large vessel occlusion. EXAM: ULTRASOUND-GUIDED ACCESS RIGHT COMMON FEMORAL ARTERY LEFT-SIDED CERVICAL AND CEREBRAL ANGIOGRAM MECHANICAL THROMBECTOMY LEFT MCA DEPLOYMENT OF ANGIO-SEAL FOR HEMOSTASIS COMPARISON:  CT imaging same date MEDICATIONS: None ANESTHESIA/SEDATION: The anesthesia team was present to provide general endotracheal tube anesthesia and for patient monitoring during the procedure. Intubation was performed in biplane room 2. left radial arterial line was performed by the anesthesia team. Interventional neuro radiology nursing staff was also present. CONTRAST:  70 cc Omnipaque 300 FLUOROSCOPY TIME:  Fluoroscopy Time: 14 minutes 54 seconds (779.8  mGy). COMPLICATIONS: None TECHNIQUE: Informed written consent was obtained from the patient's family after a thorough discussion of the procedural risks, benefits and alternatives by our stroke neurology team. Specific risks discussed include: Bleeding, infection, contrast reaction, kidney injury/failure, need for further procedure/surgery, arterial injury or dissection, embolization to new territory, intracranial hemorrhage (10-15% risk), neurologic  deterioration, cardiopulmonary collapse, death. All questions were addressed. Maximal Sterile Barrier Technique was utilized including during the procedure including caps, mask, sterile gowns, sterile gloves, sterile drape, hand hygiene and skin antiseptic. A timeout was performed prior to the initiation of the procedure. The anesthesia team was present to provide general endotracheal tube anesthesia and for patient monitoring during the procedure. Interventional neuro radiology nursing staff was also present. FINDINGS: Initial Findings: Aorta: Atherosclerotic plaque of the aortic arch. The origin of the left common carotid artery head acute angulation with atherosclerotic plaque at the origin. Left common carotid artery: Angulation at the CCA origin with atherosclerotic plaque. Left external carotid artery: Patent with antegrade flow. Left internal carotid artery: Normal course caliber and contour of the cervical portion. Vertical and petrous segment patent with normal course caliber contour. Cavernous segment patent. Clinoid segment patent. Antegrade flow of the ophthalmic artery. Ophthalmic segment patent. Terminus patent. Left MCA: Proximal MCA is patent. There is a proximal origin of the inferior division/temporal branch which remains patent and perfuses the majority of the left temporal lobe. There is also a proximal frontal branch, which remains patent just proximal to the occlusion site, perfusing the pre frontal region. Significant leptomeningeal collaterals on  the initial injection, filling significant portion of the MCA territory of the left hemisphere. The occlusion is in the dominant superior division. Within the target artery: TICI 0: No perfusion or antegrade flow beyond site of occlusion. Left ACA: A 1 segment patent. A 2 segment perfuses the left territory. There is some cross flow of the anterior communicating artery, with transient opacification of the right ACA. Completion Findings: Left MCA: After single pass with direct aspiration strategy there is complete restoration of flow through the MCA. Completion: TICI 3: Complete perfusion of the territory affected by the occluded artery Flat panel CT performed demonstrating no evidence of hemorrhage. Mild contrast staining within the left paracentral lobule. PROCEDURE: The anesthesia team was present to provide general endotracheal tube anesthesia and for patient monitoring during the procedure. Intubation was performed in negative pressure [place] in neuro IR holding. Interventional neuro radiology nursing staff was also present. Ultrasound survey of the right inguinal region was performed with images stored and sent to PACs. 11 blade scalpel was used to make a small incision. Blunt dissection was performed with US guidance. A micropuncture needle was used access the right common femoral artery under ultrasound. With excellent arterial blood flow returned, an .018 micro wire was passed through the needle, observed to enter the abdominal aorta under fluoroscopy. The needle was removed, and a micropuncture sheath was placed over the wire. The inner dilator and wire were removed, and an 035 wire was advanced under fluoroscopy into the abdominal aorta. The sheath was removed and a 25cm 15F straight vascular sheath was placed. The dilator was removed and the sheath was flushed. Sheath was attached to pressurized and heparinized saline bag for constant forward flow. A coaxial system was then advanced over the 035 wire. This  included a 95cm 087 "[place]" balloon guide with coaxial 125cm Berenstein diagnostic catheter. This was advanced to the proximal descending thoracic aorta. Wire was then removed. Double flush of the catheter was performed. Catheter was then used to select the left common carotid artery. With the coaxial configuration, glidewire was attempted to navigate into the left common carotid artery origin. We failed to access with this coaxial configuration, which was then removed on the 03/05 diagnostic wire. JB 1 catheter was then advanced on the diagnostic wire  to the descending thoracic aorta. Wire was removed and the catheter was double flushed. Catheter was then used to engage the left common carotid artery origin. Standard Glidewire was advanced into the distal left common carotid artery and the JB 1 was advanced on the wire. Wire was removed with gentle contrast injection confirming location within the external carotid artery. The catheter was withdrawn into the common carotid artery with the Glidewire used to select the left ICA. Once the JB 1 catheter was advanced on the Glidewire into a distal cervical ICA position, the Glidewire was removed. An exchange length roadrunner wire was then placed. Catheter was removed. The coaxial parents teen catheter and wall wrist balloon guide were then advanced on the roadrunner wire to the distal ICA. With a distal position of the balloon guide achieved, the parents teen catheter and wire were removed with constant aspiration at the hub. Aspiration was performed at the hub of the catheter. Angiogram was performed. Road map function was used once the occluded vessel was identified. Copious back flush was performed and the balloon catheter was attached to heparinized and pressurized saline bag for forward flow. A second coaxial system was then advanced through the balloon catheter, which included the selected intermediate catheter, microcatheter, and microwire. In this scenario, the  set up included a zoom 55 137 cm intermediate catheter, a Trevo Provue18 microcatheter, and 014 synchro soft wire. This system was advanced through the balloon guide catheter under the road-map function, with adequate back-flush at the rotating hemostatic valve at that back end of the balloon guide. Microcatheter and the intermediate catheter system were advanced through the terminal ICA and MCA to the level of the occlusion. Microwire and catheter did not cross the occluded segment. The aspiration catheter was then advanced into the proximal MCA. Microcatheter and wire were removed with constant drip at the hub. The proprietary engine was then attached to the hub of the zoom catheter. Flow was confirmed and then the catheter was advanced into the occluded segment, observing cessation of flow. Catheter was then removed from the balloon guide under constant aspiration. Free aspiration was confirmed at the hub of the balloon guide catheter, with free blood return confirmed. Control angiogram was performed. Restoration of flow was confirmed. Angiogram of the cervical ICA was performed. Balloon guide was then removed. The skin at the puncture site was then cleaned with Chlorhexidine. The 8 French sheath was removed and a 7 [place] sheath was placed. 2 minutes of manual pressure were observed and then a 6 [place] Angio-Seal was deployed. This maneuver was performed, given that we have no stock available of 8 French Angio-Seal for hemostasis device. Flat panel CT was performed. Patient was extubated once the CT was reviewed and a negative COVID test was confirmed. Patient tolerated the procedure well and remained hemodynamically stable throughout. No complications were encountered and no significant blood loss encountered. IMPRESSION: Status post ultrasound guided access right common femoral artery for left-sided cervical/cerebral angiogram and mechanical thrombectomy of left MCA occlusion, achieving TICI 3 flow. Angio-Seal  for hemostasis. Signed, Dulcy Fanny. Dellia Nims, RPVI [facility] Radiology PLAN: ICU status Target systolic blood pressure of 120-140 Right hip straight time 6 hours Frequent neurovascular checks Repeat neurologic imaging with CT and/MRI at the discretion of neurology team Electronically Signed   By: Corrie Mckusick D.O.   On: 09/27/2020 17:03   IR US Guide Vasc Access Right  Result Date: 09/27/2020 INDICATION: 70 year old female presents with acute stroke,  left MCA syndrome. She presents for cerebral angiogram and attempt for mechanical thrombectomy to treat emergent large vessel occlusion. EXAM: ULTRASOUND-GUIDED ACCESS RIGHT COMMON FEMORAL ARTERY LEFT-SIDED CERVICAL AND CEREBRAL ANGIOGRAM MECHANICAL THROMBECTOMY LEFT MCA DEPLOYMENT OF ANGIO-SEAL FOR HEMOSTASIS COMPARISON:  CT imaging same date MEDICATIONS: None ANESTHESIA/SEDATION: The anesthesia team was present to provide general endotracheal tube anesthesia and for patient monitoring during the procedure. Intubation was performed in biplane room 2. left radial arterial line was performed by the anesthesia team. Interventional neuro radiology nursing staff was also present. CONTRAST:  70 cc Omnipaque 300 FLUOROSCOPY TIME:  Fluoroscopy Time: 14 minutes 54 seconds (779.8 mGy). COMPLICATIONS: None TECHNIQUE: Informed written consent was obtained from the patient's family after a thorough discussion of the procedural risks, benefits and alternatives by our stroke neurology team. Specific risks discussed include: Bleeding, infection, contrast reaction, kidney injury/failure, need for further procedure/surgery, arterial injury or dissection, embolization to new territory, intracranial hemorrhage (10-15% risk), neurologic deterioration, cardiopulmonary collapse, death. All questions were addressed. Maximal Sterile Barrier Technique was utilized including during the procedure including caps, mask, sterile gowns, sterile  gloves, sterile drape, hand hygiene and skin antiseptic. A timeout was performed prior to the initiation of the procedure. The anesthesia team was present to provide general endotracheal tube anesthesia and for patient monitoring during the procedure. Interventional neuro radiology nursing staff was also present. FINDINGS: Initial Findings: Aorta: Atherosclerotic plaque of the aortic arch. The origin of the left common carotid artery head acute angulation with atherosclerotic plaque at the origin. Left common carotid artery: Angulation at the CCA origin with atherosclerotic plaque. Left external carotid artery: Patent with antegrade flow. Left internal carotid artery: Normal course caliber and contour of the cervical portion. Vertical and petrous segment patent with normal course caliber contour. Cavernous segment patent. Clinoid segment patent. Antegrade flow of the ophthalmic artery. Ophthalmic segment patent. Terminus patent. Left MCA: Proximal MCA is patent. There is a proximal origin of the inferior division/temporal branch which remains patent and perfuses the majority of the left temporal lobe. There is also a proximal frontal branch, which remains patent just proximal to the occlusion site, perfusing the pre frontal region. Significant leptomeningeal collaterals on the initial injection, filling significant portion of the MCA territory of the left hemisphere. The occlusion is in the dominant superior division. Within the target artery: TICI 0: No perfusion or antegrade flow beyond site of occlusion. Left ACA: A 1 segment patent. A 2 segment perfuses the left territory. There is some cross flow of the anterior communicating artery, with transient opacification of the right ACA. Completion Findings: Left MCA: After single pass with direct aspiration strategy there is complete restoration of flow through the MCA. Completion: TICI 3: Complete perfusion of the territory affected by the occluded artery Flat panel  CT performed demonstrating no evidence of hemorrhage. Mild contrast staining within the left paracentral lobule. PROCEDURE: The anesthesia team was present to provide general endotracheal tube anesthesia and for patient monitoring during the procedure. Intubation was performed in negative pressure [place] in neuro IR holding. Interventional neuro radiology nursing staff was also present. Ultrasound survey of the right inguinal region was performed with images stored and sent to PACs. 11 blade scalpel was used to make a small incision. Blunt dissection was performed with US guidance. A micropuncture needle was used access the right common femoral artery under ultrasound. With excellent arterial blood flow returned, an .018 micro wire was passed through the needle, observed to enter the abdominal aorta under fluoroscopy. The  needle was removed, and a micropuncture sheath was placed over the wire. The inner dilator and wire were removed, and an 035 wire was advanced under fluoroscopy into the abdominal aorta. The sheath was removed and a 25cm 21F straight vascular sheath was placed. The dilator was removed and the sheath was flushed. Sheath was attached to pressurized and heparinized saline bag for constant forward flow. A coaxial system was then advanced over the 035 wire. This included a 95cm 087 "Walrus" balloon guide with coaxial 125cm Berenstein diagnostic catheter. This was advanced to the proximal descending thoracic aorta. Wire was then removed. Double flush of the catheter was performed. Catheter was then used to select the left common carotid artery. With the coaxial configuration, glidewire was attempted to navigate into the left common carotid artery origin. We failed to access with this coaxial configuration, which was then removed on the 03/05 diagnostic wire. JB 1 catheter was then advanced on the diagnostic wire to the descending thoracic aorta. Wire was removed and the catheter was double flushed. Catheter  was then used to engage the left common carotid artery origin. Standard Glidewire was advanced into the distal left common carotid artery and the JB 1 was advanced on the wire. Wire was removed with gentle contrast injection confirming location within the external carotid artery. The catheter was withdrawn into the common carotid artery with the Glidewire used to select the left ICA. Once the JB 1 catheter was advanced on the Glidewire into a distal cervical ICA position, the Glidewire was removed. An exchange length roadrunner wire was then placed. Catheter was removed. The coaxial parents teen catheter and wall wrist balloon guide were then advanced on the roadrunner wire to the distal ICA. With a distal position of the balloon guide achieved, the parents teen catheter and wire were removed with constant aspiration at the hub. Aspiration was performed at the hub of the catheter. Angiogram was performed. Road map function was used once the occluded vessel was identified. Copious back flush was performed and the balloon catheter was attached to heparinized and pressurized saline bag for forward flow. A second coaxial system was then advanced through the balloon catheter, which included the selected intermediate catheter, microcatheter, and microwire. In this scenario, the set up included a zoom 55 137 cm intermediate catheter, a Trevo Provue18 microcatheter, and 014 synchro soft wire. This system was advanced through the balloon guide catheter under the road-map function, with adequate back-flush at the rotating hemostatic valve at that back end of the balloon guide. Microcatheter and the intermediate catheter system were advanced through the terminal ICA and MCA to the level of the occlusion. Microwire and catheter did not cross the occluded segment. The aspiration catheter was then advanced into the proximal MCA. Microcatheter and wire were removed with constant drip at the hub. The proprietary engine was then  attached to the hub of the zoom catheter. Flow was confirmed and then the catheter was advanced into the occluded segment, observing cessation of flow. Catheter was then removed from the balloon guide under constant aspiration. Free aspiration was confirmed at the hub of the balloon guide catheter, with free blood return confirmed. Control angiogram was performed. Restoration of flow was confirmed. Angiogram of the cervical ICA was performed. Balloon guide was then removed. The skin at the puncture site was then cleaned with Chlorhexidine. The 8 French sheath was removed and a 7 [place] sheath was placed. 2 minutes of manual pressure were observed and then a 6 [place] Angio-Seal  was deployed. This maneuver was performed, given that we have no stock available of 8 French Angio-Seal for hemostasis device. Flat panel CT was performed. Patient was extubated once the CT was reviewed and a negative COVID test was confirmed. Patient tolerated the procedure well and remained hemodynamically stable throughout. No complications were encountered and no significant blood loss encountered. IMPRESSION: Status post ultrasound guided access right common femoral artery for left-sided cervical/cerebral angiogram and mechanical thrombectomy of left MCA occlusion, achieving TICI 3 flow. Angio-Seal for hemostasis. Signed, Dulcy Fanny. Dellia Nims, RPVI [facility] Inter[facility] Radiology Special[facility] Radiology PLAN: ICU status Target systolic blood pressure of 120-140 Right hip straight time 6 hours Frequent neurovascular checks Repeat neurologic imaging with CT and/MRI at the discretion of neurology team Electronically Signed   By: Corrie Mckusick D.O.   On: 09/27/2020 17:03   DG Foot 2 Views Right  Result Date: 09/28/2020 CLINICAL DATA:  Right toe swelling. EXAM: RIGHT FOOT - 2 VIEW COMPARISON:  None. FINDINGS: There is no evidence of fracture or dislocation. There is no evidence of arthropathy or other focal bone abnormality.  Soft tissues are unremarkable. IMPRESSION: Negative. Electronically Signed   By: Marijo Conception M.D.   On: 09/28/2020 13:59   IR PERCUTANEOUS ART THROMBECTOMY/INFUSION INTRACRANIAL INC DIAG ANGIO  Result Date: 09/27/2020 INDICATION: 70 year old female presents with acute stroke, left MCA syndrome. She presents for cerebral angiogram and attempt for mechanical thrombectomy to treat emergent large vessel occlusion. EXAM: ULTRASOUND-GUIDED ACCESS RIGHT COMMON FEMORAL ARTERY LEFT-SIDED CERVICAL AND CEREBRAL ANGIOGRAM MECHANICAL THROMBECTOMY LEFT MCA DEPLOYMENT OF ANGIO-SEAL FOR HEMOSTASIS COMPARISON:  CT imaging same date MEDICATIONS: None ANESTHESIA/SEDATION: The anesthesia team was present to provide general endotracheal tube anesthesia and for patient monitoring during the procedure. Intubation was performed in biplane room 2. left radial arterial line was performed by the anesthesia team. Interventional neuro radiology nursing staff was also present. CONTRAST:  70 cc Omnipaque 300 FLUOROSCOPY TIME:  Fluoroscopy Time: 14 minutes 54 seconds (779.8 mGy). COMPLICATIONS: None TECHNIQUE: Informed written consent was obtained from the patient's family after a thorough discussion of the procedural risks, benefits and alternatives by our stroke neurology team. Specific risks discussed include: Bleeding, infection, contrast reaction, kidney injury/failure, need for further procedure/surgery, arterial injury or dissection, embolization to new territory, intracranial hemorrhage (10-15% risk), neurologic deterioration, cardiopulmonary collapse, death. All questions were addressed. Maximal Sterile Barrier Technique was utilized including during the procedure including caps, mask, sterile gowns, sterile gloves, sterile drape, hand hygiene and skin antiseptic. A timeout was performed prior to the initiation of the procedure. The anesthesia team was present to provide general endotracheal tube anesthesia and for patient  monitoring during the procedure. Interventional neuro radiology nursing staff was also present. FINDINGS: Initial Findings: Aorta: Atherosclerotic plaque of the aortic arch. The origin of the left common carotid artery head acute angulation with atherosclerotic plaque at the origin. Left common carotid artery: Angulation at the CCA origin with atherosclerotic plaque. Left external carotid artery: Patent with antegrade flow. Left internal carotid artery: Normal course caliber and contour of the cervical portion. Vertical and petrous segment patent with normal course caliber contour. Cavernous segment patent. Clinoid segment patent. Antegrade flow of the ophthalmic artery. Ophthalmic segment patent. Terminus patent. Left MCA: Proximal MCA is patent. There is a proximal origin of the inferior division/temporal branch which remains patent and perfuses the majority of the left temporal lobe. There is also a proximal frontal branch, which remains patent just proximal to the occlusion site, perfusing  the pre frontal region. Significant leptomeningeal collaterals on the initial injection, filling significant portion of the MCA territory of the left hemisphere. The occlusion is in the dominant superior division. Within the target artery: TICI 0: No perfusion or antegrade flow beyond site of occlusion. Left ACA: A 1 segment patent. A 2 segment perfuses the left territory. There is some cross flow of the anterior communicating artery, with transient opacification of the right ACA. Completion Findings: Left MCA: After single pass with direct aspiration strategy there is complete restoration of flow through the MCA. Completion: TICI 3: Complete perfusion of the territory affected by the occluded artery Flat panel CT performed demonstrating no evidence of hemorrhage. Mild contrast staining within the left paracentral lobule. PROCEDURE: The anesthesia team was present to provide general endotracheal tube anesthesia and for patient  monitoring during the procedure. Intubation was performed in negative pressure [place] in neuro IR holding. Interventional neuro radiology nursing staff was also present. Ultrasound survey of the right inguinal region was performed with images stored and sent to PACs. 11 blade scalpel was used to make a small incision. Blunt dissection was performed with US guidance. A micropuncture needle was used access the right common femoral artery under ultrasound. With excellent arterial blood flow returned, an .018 micro wire was passed through the needle, observed to enter the abdominal aorta under fluoroscopy. The needle was removed, and a micropuncture sheath was placed over the wire. The inner dilator and wire were removed, and an 035 wire was advanced under fluoroscopy into the abdominal aorta. The sheath was removed and a 25cm 13F straight vascular sheath was placed. The dilator was removed and the sheath was flushed. Sheath was attached to pressurized and heparinized saline bag for constant forward flow. A coaxial system was then advanced over the 035 wire. This included a 95cm 087 "Walrus" balloon guide with coaxial 125cm Berenstein diagnostic catheter. This was advanced to the proximal descending thoracic aorta. Wire was then removed. Double flush of the catheter was performed. Catheter was then used to select the left common carotid artery. With the coaxial configuration, glidewire was attempted to navigate into the left common carotid artery origin. We failed to access with this coaxial configuration, which was then removed on the 03/05 diagnostic wire. JB 1 catheter was then advanced on the diagnostic wire to the descending thoracic aorta. Wire was removed and the catheter was double flushed. Catheter was then used to engage the left common carotid artery origin. Standard Glidewire was advanced into the distal left common carotid artery and the JB 1 was advanced on the wire. Wire was removed with gentle contrast  injection confirming location within the external carotid artery. The catheter was withdrawn into the common carotid artery with the Glidewire used to select the left ICA. Once the JB 1 catheter was advanced on the Glidewire into a distal cervical ICA position, the Glidewire was removed. An exchange length roadrunner wire was then placed. Catheter was removed. The coaxial parents teen catheter and wall wrist balloon guide were then advanced on the roadrunner wire to the distal ICA. With a distal position of the balloon guide achieved, the parents teen catheter and wire were removed with constant aspiration at the hub. Aspiration was performed at the hub of the catheter. Angiogram was performed. Road map function was used once the occluded vessel was identified. Copious back flush was performed and the balloon catheter was attached to heparinized and pressurized saline bag for forward flow. A second coaxial system was  then advanced through the balloon catheter, which included the selected intermediate catheter, microcatheter, and microwire. In this scenario, the set up included a zoom 55 137 cm intermediate catheter, a Trevo Provue18 microcatheter, and 014 synchro soft wire. This system was advanced through the balloon guide catheter under the road-map function, with adequate back-flush at the rotating hemostatic valve at that back end of the balloon guide. Microcatheter and the intermediate catheter system were advanced through the terminal ICA and MCA to the level of the occlusion. Microwire and catheter did not cross the occluded segment. The aspiration catheter was then advanced into the proximal MCA. Microcatheter and wire were removed with constant drip at the hub. The proprietary engine was then attached to the hub of the zoom catheter. Flow was confirmed and then the catheter was advanced into the occluded segment, observing cessation of flow. Catheter was then removed from the balloon guide under constant  aspiration. Free aspiration was confirmed at the hub of the balloon guide catheter, with free blood return confirmed. Control angiogram was performed. Restoration of flow was confirmed. Angiogram of the cervical ICA was performed. Balloon guide was then removed. The skin at the puncture site was then cleaned with Chlorhexidine. The 8 French sheath was removed and a 7 [place] sheath was placed. 2 minutes of manual pressure were observed and then a 6 [place] Angio-Seal was deployed. This maneuver was performed, given that we have no stock available of 8 French Angio-Seal for hemostasis device. Flat panel CT was performed. Patient was extubated once the CT was reviewed and a negative COVID test was confirmed. Patient tolerated the procedure well and remained hemodynamically stable throughout. No complications were encountered and no significant blood loss encountered. IMPRESSION: Status post ultrasound guided access right common femoral artery for left-sided cervical/cerebral angiogram and mechanical thrombectomy of left MCA occlusion, achieving TICI 3 flow. Angio-Seal for hemostasis. Signed, Dulcy Fanny. Dellia Nims, RPVI Vascular and Interventional Radiology Specialists [facility] Radiology PLAN: ICU status Target systolic blood pressure of 120-140 Right hip straight time 6 hours Frequent neurovascular checks Repeat neurologic imaging with CT and/MRI at the discretion of neurology team Electronically Signed   By: Corrie Mckusick D.O.   On: 09/27/2020 17:03    PHYSICAL EXAM General: Appears well-developed; moderate distress. Psych: Affect appropriate to situation Eyes: No scleral injection HENT: No OP obstrucion Head: Normocephalic.  Cardiovascular: irreg rate- will check with 12 lead EKG to confirm Afib Respiratory: Sort of breath, rapid rate, some fine crackles GI: Soft.  No distension. There is no tenderness.  Skin: WDI    Neurological Examination Mental Status: Alert, attends, She attempts to mouth words,  but no audiable speech. Yes/no is unreliable and she follows commands intermittently and with delay. Mixed aphasia.  Cranial Nerves: Left gaze pref, but will look to right and cross midline. Right facial droop. Limited sensory testing d/t communication deficit Motor:Tone and bulk:normal tone throughout; moving left side wnl. Right side has some  Sensory: seems to be blunted on right, difficult to test d/t mixed aphasia Deep Tendon Reflexes: 2+ and symmetric throughout Plantars: Right: downgoing   Left: downgoing Cerebellar: unable Gait: unable  ASSESSMENT/PLAN Ms. ELLESSE ANTENUCCI is a 70 y.o. female acute right hemiplegia, right facial droop, leftward gaze and global aphasia. NIHSS 27. CTA showed distal Left M1 occlusion with favorable perfusion missmatich. She was outside of time window for IV tPA, but was emergently treated with thrombectomy with TICI 3 restoration of flow.   Stroke:  left MCA infarct;  cardioembolic secondary to Afib (new dx, not on [place])  Code Stroke CT head No acute abnormality. Small vessel disease. Atrophy. ASPECTS 7.   CTA head & neck distal left M1 or proximal M2 MCA in the anterior aspect of the left sylvian fissure (series 10, images 85 through 88) which correlates with the hyperdensity seen on same day noncontrast head CT and is concerning for a high-grade stenosis or occlusion. The remainder of the CTA is essentially nondiagnostic in the head and neck due to non arterial timing. CT perfusion 49ml core in anterior LMCA. 51ml detected penumbra MRI  pending 2D Echo pending LDL 91 HgbA1c 7.8 VTE prophylaxis - scds    Diet   Diet NPO time specified   none  prior to admission, now on aspirin 300 mg suppository daily. Till cortrak is placed and then switch to per tube Therapy recommendations:  pending Disposition:  pending  Hypertension Home meds:  Aldactone, norvasc, coreg, chlorthalidone Stable Permissive hypertension (OK if < 220/120) but gradually  normalize in 5-7 days Long-term BP goal normotensive  Hyperlipidemia Home meds:  lipitor 80mg , resumed in hospital LDL 91, goal < 70 Add   High intensity statin  Continue statin at discharge  Diabetes type II Uncontrolled Home meds:  Farxiga, levemir flexpen, novolog HgbA1c 7.8, goal < 7.0 CBGs Recent Labs    09/28/20 0410 09/28/20 0732 09/28/20 1107  GLUCAP 83 135* 179*    SSI  Other Stroke Risk Factors Advanced Age >/= 75  Former Cigarette smoker; quit 39yrs ago Obesity, Body mass index is 37.49 kg/m., BMI >/= 30 associated with increased stroke risk, recommend weight loss, diet and exercise as appropriate  CAD Carotid artery disease Obstructive sleep apnea, on CPAP at home- RT consulted for to add this for daytime or nighttime sleeping.  Right side heart failure- stopped IVF and ordred Lasix 20mg . Foley catheter for strict I&Os. Echo pending for better evaluation. May need CCM on board New dx of Afib- 12lead ordered to confirm.   Other Active Problems Morbid obesity- s/p gastric banding in 2010 with revision in 2020 Hyperthyroidism- on methimazole at home Dysphasia- d/t stroke- will order Cortrak for nutrition and meds   Hospital day # 1  Desiree Metzger-Cihelka, ARNP-C, ANVP-BC Pager: [phone]   ATTENDING NOTE: I reviewed above note and agree with the assessment and plan. Pt was seen and examined.   70 year old female with history of CKD, diabetes, hypertension, hyperlipidemia, CHF, hypothyroidism, obesity, OSA on CPAP, pulmonary hypertension admitted for right-sided weakness, right facial droop, left gaze and aphasia.  CT showed left MCA infarct with left MCA hyperdense sign.  CTA head and neck poor bolus timing but likely left MCA occlusion.  CT perfusion showed pseudonormalization with limited penumbra.  Status post IR with TICI3 revascularization.  MRI showed moderate sized left MCA infarct, MRA showed revascularized left MCA.  LE venous Doppler negative  for DVT.  EF 60 to 65%.  LDL 91, A1c 7.8, UDS negative.  Creatinine 2.6, WBC 18.2-> 14.3.  Glucose initially showed hyperglycemia but now much improved.  On exam, patient has respite distress especially with expiratory phase difficulty.  Awake alert, eyes open, however nonverbal, follows some midline commands with delay.  Not able to follow peripheral commands.  Blinking to visual threat on the left but not on the right.  Left gaze palsy but able to cross midline.  Right facial droop, right upper extremity 3/5, left upper remedy LEs 4/5.  Bilateral lower extremity withdrawal to pain, however left stronger  than the right. Sensation, coordination not corporative and gait not tested.  Patient EKG and telemetry showed atrial fibrillation.  In 08/2020 patient had heart monitoring for frequent PVCs but no A. fib at that time.  Etiology for patient stroke likely due to A. fib, new diagnosis.  Patient did not pass a swallow, core track placed under the fluoroscopy given gastric band.  Currently on tube feeding and aspirin.  For respiratory distress, discontinuing IV fluid, put on Lasix 20 IV and ABG which was unremarkable.  Patient has decreased urine output and continued respiratory distress, consulted CCM and appreciated their assistance.  Patient is DNR/DNI per sister who lives in [place].  For detailed assessment and plan, please refer to above as I have made changes wherever appropriate.   Rosalin Hawking, MD PhD Stroke Neurology 09/29/2020 12:07 AM  This patient is critically ill due to left MCA stroke, status post thrombectomy, new diagnosed A. fib, respiratory distress and at significant risk of neurological worsening, death form respiratory failure, recurrent stroke, hemorrhagic conversion, seizure, heart failure. This patient's care requires constant monitoring of vital signs, hemodynamics, respiratory and cardiac monitoring, review of multiple databases, neurological assessment, discussion with family,  other specialists and medical decision making of high complexity. I spent 45 minutes of neurocritical care time in the care of this patient.  I discussed with CCM Dr. Shearon Stalls.    To contact Stroke Continuity provider, please refer to [email or web address] After hours, contact General Neurology

## 2020-09-28 NOTE — Progress Notes (Signed)
Initial Nutrition Assessment  DOCUMENTATION CODES:   Obesity unspecified  INTERVENTION:   Initiate tube feeding via post pyloric cortrak tube: Vital 1.2 at 65 ml/h (1560 ml per day)  Provides 1872 kcal, 117 gm protein, 1265 ml free water daily    NUTRITION DIAGNOSIS:   Inadequate oral intake related to inability to eat as evidenced by NPO status.  GOAL:   Patient will meet greater than or equal to 90% of their needs  MONITOR:   TF tolerance, Labs, Skin  REASON FOR ASSESSMENT:   Consult Enteral/tube feeding initiation and management  ASSESSMENT:   Pt with PMH of bilateral carotid artery plaque, CKD4, DM, HLD, CHF, HTN, hyperthyroidism, morbid obesity, OSA on CPAP, proliferative retinopathy, pulmonary HTN, gastric band surgery 2010 and revision in 2011 now admitted after being found down by neighbor with L MCA stroke.    Pt discussed during ICU rounds and with RN.  Per CTA pt with L M1 occlusion.  Per documentation weight has trended up over the last few months.  During cortrak placement pt with dark brown/black drainage from cortrak tube.  Pt unable to answer any questions during visit.    7/28 s/p thrombectomy  7/29 cortrak attempted unable to advance due to gastric band; tube advanced by radiology   Medications reviewed and include: SSI Cleviprex @ 2 ml/h provides: 96 kcal  Labs reviewed:  CBG's: 83-179    NUTRITION - FOCUSED PHYSICAL EXAM:  Flowsheet Row Most Recent Value  Orbital Region No depletion  Upper Arm Region No depletion  Thoracic and Lumbar Region No depletion  Buccal Region No depletion  Temple Region Mild depletion  Clavicle Bone Region No depletion  Clavicle and Acromion Bone Region No depletion  Scapular Bone Region Unable to assess  Dorsal Hand No depletion  Patellar Region No depletion  Anterior Thigh Region Mild depletion  Posterior Calf Region Moderate depletion  Edema (RD Assessment) None  Hair Reviewed  Eyes Unable to assess   Mouth Unable to assess  Skin Reviewed  Nails Reviewed       Diet Order:   Diet Order             Diet NPO time specified  Diet effective now                   EDUCATION NEEDS:   Not appropriate for education at this time  Skin:  Skin Assessment:  (stage 1/MASD: buttocks)  Last BM:     Height:   Ht Readings from Last 1 Encounters:  09/27/20 5\' 3"  (1.6 m)    Weight:   Wt Readings from Last 1 Encounters:  09/27/20 96 kg    Ideal Body Weight:     BMI:  Body mass index is 37.49 kg/m.  Estimated Nutritional Needs:   Kcal:  1800-2000  Protein:  100-120 grams  Fluid:  > 1.8 L/day  Lockie Pares., RD, LDN, CNSC See AMiON for contact information

## 2020-09-28 NOTE — Progress Notes (Signed)
BLE venous duplex has been completed.  Results can be found under chart review under CV PROC. 09/28/2020 5:19 PM Margie Urbanowicz RVT, RDMS

## 2020-09-28 NOTE — Consult Note (Signed)
See separate consult note from same day from PCCM.  My attestation is as follows.   I agree with the Advanced Practitioner's note, impression, and recommendations as outlined. I have taken an independent interval history, reviewed the chart and examined the patient.  My medical decision making is as follows:   Subjective: This is a 70 yo woman who presented from home as code stroke with right sided hemiplegia and facial droop as well as aphasia. Found to have left MCA occlusion and treated with emergent thrombectomy in IR yesterday afternoon. Was intubated for the procedure and is now extubated and on 6LNC.   Objective: Vitals:   09/28/20 1700 09/28/20 1800  BP: (!) 124/46 (!) 124/50  Pulse: 60 64  Resp: 20 20  Temp:    SpO2: 92% (!) 87%    Gen:      Somnolent, arousable, sleeping and snoring HEENT:  on nasal cannula Neck:      large neck Lungs:    diminished bilaterally  CV:         Regular rate and rhythm; no murmurs Abd:      Obese, soft, non-tender Ext:    No edema; adequate peripheral perfusion Skin:       Warm and dry; no rash Neuro:    opens eyes, moves spontaneously, nonverbal, does not follow commands   Labs/Imaging: Chest xray this morning at 2am shows pulmonary vascular congestion and cardiomegaly Cr elevated 1L positive cummulative since admission WBC elevated to 14  Assessment and Plan:  Acute hypoxemic respiratory failure Acute ischemic stroke with Left MCA occlusion s/p thrombectomy OSA on CPAP  Plan is to start Bipap nocturnally since she has CPAP. Will give a little more lasix with 80 mg IV x 1. She is 99% on 6LNC and I suspect most of her desaturation is apnea and hypoventilation from her OSA. Hopefully the lasix will help a little bit to stave off impending intubation.   The patient is critically ill with multiple organ systems failure and requires high complexity decision making for assessment and support, frequent evaluation and titration of therapies,  application of advanced monitoring technologies and extensive interpretation of multiple databases.   Critical Care Time devoted to patient care services described in this note is 34 minutes. This time reflects time of care of this [place] . This critical care time does not reflect separately billable procedures or procedure time, teaching time and supervisory time of PA/NP/Med student/Med Resident etc but could involve care discussion time.  Spero Geralds [facility] 09/28/2020 6:48 PM  Pager: see AMION  If no response to pager, please call critical care on call (see AMION) until 7pm After 7:00 pm call Elink

## 2020-09-28 NOTE — Progress Notes (Signed)
Foley catheter placed in patient at 1420, u/o for past 3h 28mL. Stroke MD notified.  Candy Sledge, RN

## 2020-09-28 NOTE — Progress Notes (Signed)
Pt escorted to fluoro for cortrak placement and back to 4N15, VSS throughout.  Candy Sledge, RN

## 2020-09-28 NOTE — Progress Notes (Signed)
OT Cancellation Note  Patient Details Name: Kathleen [name] MRN: [id number] DOB: [birth year]   Cancelled Treatment:    Reason Eval/Treat Not Completed: Patient not medically ready. Will reattempt.  Nilsa Nutting., OTR/L Acute Rehabilitation Services Pager [phone] Office [phone]   Lucille Passy M 09/28/2020, 10:33 AM

## 2020-09-28 NOTE — Consult Note (Signed)
NAME:  Kathleen [name], MRN:  [id number], DOB:  Jan 19, 1951, LOS: 1 ADMISSION DATE:  09/27/2020, CONSULTATION DATE:  09/25/2020 REFERRING MD:  Dr. Erlinda Hong, CHIEF COMPLAINT:  Acute stroke   History of Present Illness:  Kathleen [name] is a 70 y.o. female with a PMH significant for CKD stage 4, DM, chronic diastolic congestive heart failure, hypertension, hyperlipidemia, OSA on CPAP hypertension, and morbid obesity who presented to the emergency department as a code stroke. She has acute right hemiplegia with right facial droop and leftward gaze. She was admitted under stroke and sent to IR for thrombectomy. Dr. Earleen Newport was able to preform successful revascularization of left M1 occlusion. Patient was extubated post procedure and able to follow commands this am  By afternoon of 7/29 patient was seen with decreased mentation and snoring respirations with concern for ability to protect her airway. PCCM was consulted for further airway management.   Pertinent  Medical History  CKD stage 4, DM, chronic diastolic congestive heart failure, hypertension, hyperlipidemia, OSA on CPAP hypertension, and morbid obesity  Significant Hospital Events:  7/29 admitted for code stroke found to have a M! Occlusion, underwent thrombectomy by IR  Interim History / Subjective:  Somnolent with snoring respirations    Objective   Blood pressure (!) 124/46, pulse 60, temperature 99.4 F (37.4 C), temperature source Axillary, resp. rate 20, height 5\' 3"  (1.6 m), weight 96 kg, SpO2 92 %.        Intake/Output Summary (Last 24 hours) at 09/28/2020 1753 Last data filed at 09/28/2020 1700 Gross per 24 hour  Intake 998.71 ml  Output 910 ml  Net 88.71 ml   Filed Weights   09/27/20 1358 09/27/20 1408  Weight: 93.6 kg 96 kg    Examination: General: Acute on chronically ill appearing elderly female lying in bed, in NAD HEENT: [place]/AT, MM pink/moist, PERRL,  Neuro: Somnolent  CV: s1s2 regular rate and rhythm, no murmur,  rubs, or gallops,  PULM:  Oxygen saturations fluctuating between 88-96 on 6L [place], snoring respirations  GI: soft, bowel sounds active in all 4 quadrants, non-tender, non-distended, tolerating TF Extremities: warm/dry, generalized edema  Skin: no rashes or lesions  Resolved Hospital Problem list     Assessment & Plan:  Acute respiratory insufficiencies with concern for ability to protect airway -Patient has become progressively less responsive throughout the day and no has snoring reparations and concern for ongoing microaspiration's Hx of OSA on CPAP P: Place patient on venti mask now Place BIPAP in room if patient has any further decompensation  Currently family is discussing amongst themselves if patient would want to be intubated  Head of bed elevated 30 degrees Ensure adequate pulmonary hygiene  Avoid sedation Can utilize NTS if needed   Acute left MCA stroke  -Felt to cardioembolic with new onset A-fib  P: Management per neurology  Maintain neuro protective measures; goal for eurothermia, euglycemia, eunatermia, normoxia, and PCO2 goal of 35-40 Nutrition and bowel regiment  Seizure precautions  Aspirations precautions   Hx of diastolic CHF -EF 33-35% with grade II diastolic dysfunction  45/6256 Hx of HTN/HLD -Norvasc, Lipitor, Coreg, Aldactone,  Paroxysmal A-fib, new onset  P: Aggressively diureses as patient appears volume up  Strict intake and output  Daily weight  Continuous telemetry  Hold home medications   HX of type II diabetes  -Home medications include Farrxiga  P: SSI CBG q4  Goals of care  Long discussion held with patients sister regarding patients wishes for aggressive care.  Horris Latino was able to provide Vandella's Living will (copy placed in chart) and stated that [facility] valved her quality of life over quantity. Horris Latino stated that she would like to proceed with a conservative approach with the hope that Keryl will continue to improve. Horris Latino would consider  a short course intubation if we the medical team thought it was necessary and could lead to [place] making a recovery to a good quality of life. Horris Latino would like to be contacted if Jamari were to worsen.   Best Practice (right click and "Reselect all SmartList Selections" daily)   Diet/type: tubefeeds DVT prophylaxis: SCD GI prophylaxis: PPI Lines: N/A Foley:  N/A Code Status:  DNR Last date of multidisciplinary goals of care discussion: See above   Labs   CBC: Recent Labs  Lab 09/27/20 1356 09/27/20 1401 09/28/20 0245 09/28/20 1108  WBC 18.2*  --  14.3*  --   NEUTROABS 15.9*  --   --   --   HGB 12.2 13.6 10.6* 15.6*  HCT 38.9 40.0 32.0* 46.0  MCV 93.3  --  88.2  --   PLT 343  --  325  --     Basic Metabolic Panel: Recent Labs  Lab 09/27/20 1356 09/27/20 1401 09/28/20 0245 09/28/20 1108 09/28/20 1352  NA 137 135 139 138  --   K 4.7 4.5 3.6 3.9  --   CL 97* 101 100  --   --   CO2 19*  --  25  --   --   GLUCOSE 389* 384* 113*  --   --   BUN 35* 33* 36*  --   --   CREATININE 2.78* 2.60* 2.63*  --   --   CALCIUM 9.1  --  8.6*  --   --   MG  --   --   --   --  2.0  PHOS  --   --   --   --  5.4*   GFR: Estimated Creatinine Clearance: 21.9 mL/min (A) (by C-G formula based on SCr of 2.63 mg/dL (H)). Recent Labs  Lab 09/27/20 1356 09/28/20 0245  WBC 18.2* 14.3*    Liver Function Tests: Recent Labs  Lab 09/27/20 1356  AST 38  ALT 13  ALKPHOS 69  BILITOT 2.9*  PROT 6.2*  ALBUMIN 3.2*   No results for input(s): LIPASE, AMYLASE in the last 168 hours. No results for input(s): AMMONIA in the last 168 hours.  ABG    Component Value Date/Time   PHART 7.432 09/28/2020 1108   PCO2ART 36.5 09/28/2020 1108   PO2ART 89 09/28/2020 1108   HCO3 24.3 09/28/2020 1108   TCO2 25 09/28/2020 1108   O2SAT 97.0 09/28/2020 1108     Coagulation Profile: Recent Labs  Lab 09/27/20 1356  INR 1.1    Cardiac Enzymes: No results for input(s): CKTOTAL, CKMB, CKMBINDEX,  TROPONINI in the last 168 hours.  HbA1C: Hgb A1c MFr Bld  Date/Time Value Ref Range Status  09/28/2020 02:45 AM 7.8 (H) 4.8 - 5.6 % Final    Comment:    (NOTE) Pre diabetes:          5.7%-6.4%  Diabetes:              >6.4%  Glycemic control for   <7.0% adults with diabetes   05/24/2016 12:09 AM 7.0 (H) 4.8 - 5.6 % Final    Comment:    (NOTE)         Pre-diabetes: 5.7 - 6.4  Diabetes: >6.4         Glycemic control for adults with diabetes: <7.0     CBG: Recent Labs  Lab 09/28/20 0109 09/28/20 0410 09/28/20 0732 09/28/20 1107 09/28/20 1514  GLUCAP 145* 83 135* 179* 160*    Review of Systems:   Unable to assess  Past Medical History:  She,  has a past medical history of Carotid artery plaque, CKD (chronic kidney disease), stage IV ([place]), Complication of anesthesia, Diabetes mellitus, Glaucoma, Heart disease, Hyperlipidemia, Hypertension, Hyperthyroidism, Menopause, Mixed hyperlipidemia (09/22/2013), Morbid obesity ([place]), Multinodular goiter, Obesity (BMI 30-39.9) (04/30/2015), OSA on CPAP, Proliferative retinopathy, Proteinuria, Pulmonary hypertension ([place]), PVC's (premature ventricular contractions), Right-sided heart failure ([place]), Sinus bradycardia, and Trigger finger.   Surgical History:   Past Surgical History:  Procedure Laterality Date   GASTRIC BANDING PORT REVISION N/A 02/27/2015   Procedure: GASTRIC BANDING PORT REVISION;  Surgeon: Johnathan Hausen, MD;  Location: [facility];  Service: General;  Laterality: N/A;   IR CT HEAD LTD  09/27/2020   IR PERCUTANEOUS ART THROMBECTOMY/INFUSION INTRACRANIAL INC DIAG ANGIO  09/27/2020   IR US GUIDE VASC ACCESS RIGHT  09/27/2020   LAPAROSCOPIC GASTRIC BANDING  2010   NERVE SURGERY  2001   RADIOLOGY WITH ANESTHESIA N/A 09/27/2020   Procedure: IR WITH ANESTHESIA;  Surgeon: Radiologist, Medication, MD;  Location: [facility];  Service: Radiology;  Laterality: N/A;   SPLIT NIGHT STUDY  08/03/2015   TONSILLECTOMY  1958     Social  History:   reports that she quit smoking about 13 years ago. Her smoking use included cigarettes. She has a 30.00 pack-year smoking history. She has never used smokeless tobacco. She reports current alcohol use. She reports that she does not use drugs.   Family History:  Her family history includes Heart attack in her father.   Allergies Allergies  Allergen Reactions   Ace Inhibitors Swelling    Mouth area swelling   Atorvastatin Diarrhea and Other (See Comments)    Severe diarrhea   Benicar [Olmesartan] Swelling and Other (See Comments)    Angioedema   Epinephrine Hcl Hives        Levaquin [Levofloxacin In D5w] Swelling and Other (See Comments)    Angioedema    Losartan Swelling     Home Medications  Prior to Admission medications   Medication Sig Start Date End Date Taking? Authorizing Provider  loperamide (IMODIUM A-D) 2 MG tablet Take 2 mg by mouth 4 (four) times daily as needed for diarrhea or loose stools.   Yes [provider]  omeprazole (PRILOSEC) 20 MG capsule Take 20 mg by mouth daily.   Yes [provider]  amLODipine (NORVASC) 10 MG tablet Take 10 mg by mouth daily.    [provider]  amoxicillin (AMOXIL) 875 MG tablet Take 875 mg by mouth 2 (two) times daily. 09/06/20   [provider]  atorvastatin (LIPITOR) 80 MG tablet Take 1 tablet (80 mg total) by mouth daily. Patient not taking: Reported on 09/27/2020 09/18/20 12/17/20  Charlie Pitter, PA-C  brimonidine (ALPHAGAN) 0.2 % ophthalmic solution Place 1 drop into both eyes 2 (two) times daily.    [provider]  carboxymethylcellulose (REFRESH PLUS) 0.5 % SOLN Place 1 drop into both eyes 3 (three) times daily as needed (dry eyes).    [provider]  carvedilol (COREG) 3.125 MG tablet Take 1 tablet (3.125 mg total) by mouth 2 (two) times daily. 07/18/20 10/16/20  Dunn, Nedra Hai, PA-C  chlorthalidone (HYGROTON)  25 MG tablet Take 12.5 mg by mouth daily.    [provider]  Docusate Sodium (COLACE PO) Take by mouth.    [provider]  FARXIGA 10 MG TABS tablet Take 10 mg by mouth daily.    [provider]  ketorolac (ACULAR) 0.5 % ophthalmic solution Place 1 drop into the left eye See admin instructions. INSTILL 1 DROP INTO THE LEFT EYE THREE TIMES A DAY- START 5 DAYS PRIOR TO SURGERY AND CONTINUE UNTIL GONE    [provider]  latanoprost (XALATAN) 0.005 % ophthalmic solution Place 1 drop into both eyes at bedtime.    [provider]  LEVEMIR FLEXPEN 100 UNIT/ML SOPN Inject 10-20 Units into the skin daily at 10 pm. If blood sugar > 100 take 20 units, but takes less if lower than 100 05/03/12   [provider]  methimazole (TAPAZOLE) 10 MG tablet Take 5 mg by mouth daily.  Patient not taking: No sig reported    [provider]  methimazole (TAPAZOLE) 5 MG tablet Take 5 mg by mouth daily.    [provider]  Multiple Vitamin (MULTIVITAMIN) capsule Take 1 capsule by mouth daily.    [provider]  NOVOLOG FLEXPEN 100 UNIT/ML injection Inject 0-18 Units into the skin 3 (three) times daily with meals. Based on sliding scale 05/03/12   [provider]  ONE TOUCH ULTRA TEST test strip  05/11/12   [provider]  prednisoLONE acetate (PRED FORTE) 1 % ophthalmic suspension as directed.    [provider]  spironolactone (ALDACTONE) 25 MG tablet Take 12.5 mg by mouth daily.    [provider]  timolol (BETIMOL) 0.5 % ophthalmic solution Place 1 drop into both eyes in the morning.    [provider]     Critical care time:    Performed by: Sherise Geerdes D. Harris  Total critical care time: 45 minutes  Critical care time was exclusive of separately billable procedures and treating other patients.  Critical care was necessary to treat or prevent imminent or life-threatening deterioration.  Critical care was time spent personally by me on the  following activities: development of treatment plan with patient and/or surrogate as well as nursing, discussions with consultants, evaluation of patient's response to treatment, examination of patient, obtaining history from patient or surrogate, ordering and performing treatments and interventions, ordering and review of laboratory studies, ordering and review of radiographic studies, pulse oximetry and re-evaluation of patient's condition.] Mechille Varghese D. Kenton Kingfisher, NP-C [facility] Personal contact information can be found on Amion  09/28/2020, 7:02 PM

## 2020-09-28 NOTE — TOC CAGE-AID Note (Signed)
Transition of Care [facility]) - CAGE-AID Screening   Patient Details  Name: Kathleen [name] MRN: [id number] Date of Birth: 04-02-1950  [facility]) CM/SW Contact:    Stefen Juba C Tarpley-Carter, [place] Phone Number: 09/28/2020, 11:25 AM   Clinical Narrative: Pt is unable to participate in Cage Aid.  Pt is experiencing respiratory distress at this time.  Gracemarie Skeet Tarpley-Carter, MSW, LCSW-A Pronouns:  She/Her/Hers [facility] HealthTransitions of Care Clinical Social Worker Direct Number:  [phone] Aram [email or web address]     CAGE-AID Screening: Substance Abuse Screening unable to be completed due to: : Patient unable to participate (Pt is experiencing respiratory distress at this time.)

## 2020-09-28 NOTE — Progress Notes (Signed)
While bathing patient a tick was found on patient's right lower buttcheek. Tick removed and placed in specimen cup at bedside, area marked to monitor for rash.   Candy Sledge, RN

## 2020-09-28 NOTE — Progress Notes (Signed)
Inpatient Diabetes Program Recommendations  AACE/ADA: New Consensus Statement on Inpatient Glycemic Control (2015)  Target Ranges:  Prepandial:   less than 140 mg/dL      Peak postprandial:   less than 180 mg/dL (1-2 hours)      Critically ill patients:  140 - 180 mg/dL   Lab Results  Component Value Date   GLUCAP 135 (H) 09/28/2020   HGBA1C 7.8 (H) 09/28/2020    Review of Glycemic Control Results for Kathleen, [name] (MRN [id number]) as of 09/28/2020 11:03  Ref. Range 09/27/2020 16:56 09/27/2020 20:27 09/28/2020 01:09 09/28/2020 04:10 09/28/2020 07:32  Glucose-Capillary Latest Ref Range: 70 - 99 mg/dL 286 (H)  Novolog 7 units 320 (H)  Novolog 15 units 145 (H)  Novolog 3 units 83 135 (H)   Diabetes history: DM 2 Outpatient Diabetes medications: Farxiga 10 mg Daily, Levemir 10-20 units (20 units if glucose >100), Novolog 0-18 units tid Current orders for Inpatient glycemic control:  Novolog 0-20 units Q4 hours  A1c 7.8% on 7/29  Inpatient Diabetes Program Recommendations:    - start Semglee (insulin glargine) 8 units Daily -  due to renal function reduce Novolog to 0-9 units Q4 hours  May need Novolog tube feed coverage 3-4 units Q4 hours if glucose trends are still elevated.  Thanks,  Tama Headings RN, MSN, BC-ADM Inpatient Diabetes Coordinator Team Pager [phone] (8a-5p)

## 2020-09-29 ENCOUNTER — Other Ambulatory Visit: Payer: Self-pay

## 2020-09-29 DIAGNOSIS — N1832 Chronic kidney disease, stage 3b: Secondary | ICD-10-CM

## 2020-09-29 DIAGNOSIS — I639 Cerebral infarction, unspecified: Secondary | ICD-10-CM | POA: Diagnosis not present

## 2020-09-29 DIAGNOSIS — G9341 Metabolic encephalopathy: Secondary | ICD-10-CM

## 2020-09-29 DIAGNOSIS — J9601 Acute respiratory failure with hypoxia: Secondary | ICD-10-CM | POA: Diagnosis not present

## 2020-09-29 DIAGNOSIS — N179 Acute kidney failure, unspecified: Secondary | ICD-10-CM

## 2020-09-29 DIAGNOSIS — J9690 Respiratory failure, unspecified, unspecified whether with hypoxia or hypercapnia: Secondary | ICD-10-CM

## 2020-09-29 LAB — BASIC METABOLIC PANEL
Anion gap: 15 (ref 5–15)
BUN: 51 mg/dL — ABNORMAL HIGH (ref 8–23)
CO2: 22 mmol/L (ref 22–32)
Calcium: 8.1 mg/dL — ABNORMAL LOW (ref 8.9–10.3)
Chloride: 101 mmol/L (ref 98–111)
Creatinine, Ser: 3.32 mg/dL — ABNORMAL HIGH (ref 0.44–1.00)
GFR, Estimated: 14 mL/min — ABNORMAL LOW (ref >60)
Glucose, Bld: 280 mg/dL — ABNORMAL HIGH (ref 70–99)
Potassium: 4.4 mmol/L (ref 3.5–5.1)
Sodium: 138 mmol/L (ref 135–145)

## 2020-09-29 LAB — CBC
HCT: 31.1 % — ABNORMAL LOW (ref 36.0–46.0)
Hemoglobin: 9.9 g/dL — ABNORMAL LOW (ref 12.0–15.0)
MCH: 29 pg (ref 26.0–34.0)
MCHC: 31.8 g/dL (ref 30.0–36.0)
MCV: 91.2 fL (ref 80.0–100.0)
Platelets: 289 10*3/uL (ref 150–400)
RBC: 3.41 MIL/uL — ABNORMAL LOW (ref 3.87–5.11)
RDW: 15.5 % (ref 11.5–15.5)
WBC: 12.1 10*3/uL — ABNORMAL HIGH (ref 4.0–10.5)
nRBC: 0 % (ref 0.0–0.2)

## 2020-09-29 LAB — PHOSPHORUS
Phosphorus: 5.6 mg/dL — ABNORMAL HIGH (ref 2.5–4.6)
Phosphorus: 4.3 mg/dL (ref 2.5–4.6)

## 2020-09-29 LAB — GLUCOSE, CAPILLARY
Glucose-Capillary: 234 mg/dL — ABNORMAL HIGH (ref 70–99)
Glucose-Capillary: 355 mg/dL — ABNORMAL HIGH (ref 70–99)
Glucose-Capillary: 313 mg/dL — ABNORMAL HIGH (ref 70–99)
Glucose-Capillary: 243 mg/dL — ABNORMAL HIGH (ref 70–99)
Glucose-Capillary: 213 mg/dL — ABNORMAL HIGH (ref 70–99)
Glucose-Capillary: 199 mg/dL — ABNORMAL HIGH (ref 70–99)

## 2020-09-29 LAB — MAGNESIUM
Magnesium: 2 mg/dL (ref 1.7–2.4)
Magnesium: 2 mg/dL (ref 1.7–2.4)

## 2020-09-29 MED ORDER — ASPIRIN 325 MG PO TABS
325.0000 mg | ORAL_TABLET | Freq: Every day | ORAL | Status: DC
  Administered 2020-09-29 – 2020-10-02 (×4): 325 mg
  Filled 2020-09-29 (×4): qty 1

## 2020-09-29 MED ORDER — ATORVASTATIN CALCIUM 40 MG PO TABS: 40.0000 mg | ORAL_TABLET | Freq: Every day | ORAL | Status: DC

## 2020-09-29 MED ORDER — PANTOPRAZOLE SODIUM 40 MG PO PACK
40.0000 mg | PACK | Freq: Every day | ORAL | Status: DC
  Administered 2020-09-29 – 2020-10-08 (×10): 40 mg
  Filled 2020-09-29 (×11): qty 20

## 2020-09-29 MED ORDER — INSULIN DETEMIR 100 UNIT/ML ~~LOC~~ SOLN
8.0000 [IU] | Freq: Every day | SUBCUTANEOUS | Status: DC
  Administered 2020-09-29: 8 [IU] via SUBCUTANEOUS
  Filled 2020-09-29: qty 0.08

## 2020-09-29 MED ORDER — PANTOPRAZOLE SODIUM 40 MG PO TBEC: 40.0000 mg | DELAYED_RELEASE_TABLET | Freq: Every day | ORAL | Status: DC

## 2020-09-29 MED ORDER — INSULIN DETEMIR 100 UNIT/ML ~~LOC~~ SOLN
8.0000 [IU] | Freq: Two times a day (BID) | SUBCUTANEOUS | Status: DC
  Administered 2020-09-29 – 2020-09-30 (×2): 8 [IU] via SUBCUTANEOUS
  Filled 2020-09-29 (×3): qty 0.08

## 2020-09-29 MED ORDER — ATORVASTATIN CALCIUM 40 MG PO TABS
40.0000 mg | ORAL_TABLET | Freq: Every day | ORAL | Status: DC
  Administered 2020-09-29 – 2020-10-03 (×5): 40 mg
  Filled 2020-09-29 (×5): qty 1

## 2020-09-29 NOTE — Consult Note (Signed)
NAME:  Kathleen [name], MRN:  [id number], DOB:  1950/04/27, LOS: 2 ADMISSION DATE:  09/27/2020, CONSULTATION DATE:  09/25/2020 REFERRING MD:  Dr. Erlinda Hong, CHIEF COMPLAINT:  Acute stroke   History of Present Illness:  Kathleen [name] is a 70 y.o. female with a PMH significant for CKD stage 4, DM, chronic diastolic congestive heart failure, hypertension, hyperlipidemia, OSA on CPAP hypertension, and morbid obesity who presented to the emergency department as a code stroke. She has acute right hemiplegia with right facial droop and leftward gaze. She was admitted under stroke and sent to IR for thrombectomy. Dr. Earleen Newport was able to preform successful revascularization of left M1 occlusion. Patient was extubated post procedure and able to follow commands this am  By afternoon of 7/29 patient was seen with decreased mentation and snoring respirations with concern for ability to protect her airway. PCCM was consulted for further airway management.   Pertinent  Medical History  CKD stage 4, DM, chronic diastolic congestive heart failure, hypertension, hyperlipidemia, OSA on CPAP hypertension, and morbid obesity  Significant Hospital Events:  7/29 admitted for code stroke found to have a M! Occlusion, underwent thrombectomy by IR 7/29 PCCM consulted for worsening respiratory failure  Interim History / Subjective:  Awake, not responsive, does not follow commands. Not placed on bipap overnight.   Objective   Blood pressure (!) 131/56, pulse 62, temperature 99.4 F (37.4 C), temperature source Axillary, resp. rate (!) 23, height 5\' 3"  (1.6 m), weight 96.7 kg, SpO2 97 %.    FiO2 (%):  [40 %] 40 %   Intake/Output Summary (Last 24 hours) at 09/29/2020 1135 Last data filed at 09/29/2020 1100 Gross per 24 hour  Intake 1201.08 ml  Output 190 ml  Net 1011.08 ml   Filed Weights   09/27/20 1358 09/27/20 1408 09/29/20 0344  Weight: 93.6 kg 96 kg 96.7 kg    Examination: General: chronically ill appearing,   HEENT: [place]/AT, MM pink/moist, PERRL,  Neuro: awake, tracks, does not follow commands.  CV: s1s2 regular rate and rhythm, no murmur, rubs, or gallops,  PULM:  venti mask 40% satting 96-97%, no adventitious breath sounds GI: obese, soft, nontender Extremities: warm/dry, generalized edema  Skin: no rashes or lesions  Labs/imaging personally reviewed  - chest xray shows low lung vol Cr increasing UOP 600cc, still net postiive 1.7L/24 hours  Resolved Hospital Problem list     Assessment & Plan:  Acute respiratory failure with concern for impending intubation -Patient has become progressively less responsive throughout the day and no has snoring reparations and concern for ongoing microaspiration's Hx of OSA on CPAP P: Continue venti mask and titrate down oxygen saturations goal sats over 92%.  Suspect combination of dependent atelectasis with untreated OSA and hypoventilation from decreased mental status. I think she needs to get moving with PT and that will recruit up more lung. Tried diuresing her but she didn't have a good response, will hold lasix I do not think she needs intubation at this time. Would hold on abx at this time - clinical picture not consistent with pna since this is all post-stroke and postprocedural She needs bipap tonight for her OSA.  Head of bed elevated 30 degrees Ensure adequate pulmonary hygiene  Avoid sedation Can utilize NTS if needed   Oliguric AKI on CKD stage 3 - baseline Cr around 2 - increased after diuresis attempt 7/29 - hold lasix for now - monitor repeat BMP  Acute left MCA stroke  -Felt to cardioembolic  with new onset A-fib  P: Management per neurology  Maintain neuro protective measures; goal for eurothermia, euglycemia, eunatermia, normoxia, and PCO2 goal of 35-40 Nutrition and bowel regiment  Seizure precautions  Aspirations precautions   Hx of diastolic CHF -EF 20-94% with grade II diastolic dysfunction  70/9628 Hx of  HTN/HLD -Norvasc, Lipitor, Coreg, Aldactone,  Paroxysmal A-fib, new onset  P: Aggressively diureses as patient appears volume up  Strict intake and output  Daily weight  Continuous telemetry  Hold home medications   HX of type II diabetes  -Home medications include Farrxiga  P: SSI CBG q4  Goals of care  PCCM discussed 7/29 with patient's sister Horris Latino. Patient valued quality of life - would like to be DNR but would consider short course of intubation for reversible causes if good quality of life a possibility.   Best Practice (right click and "Reselect all SmartList Selections" daily)   Diet/type: tubefeeds DVT prophylaxis: SCD GI prophylaxis: PPI Lines: N/A Foley:  N/A Code Status:  DNR Last date of multidisciplinary goals of care discussion: See above  Rest per stroke team.   Labs   CBC: Recent Labs  Lab 09/27/20 1356 09/27/20 1401 09/28/20 0245 09/28/20 1108 09/29/20 0434  WBC 18.2*  --  14.3*  --  12.1*  NEUTROABS 15.9*  --   --   --   --   HGB 12.2 13.6 10.6* 15.6* 9.9*  HCT 38.9 40.0 32.0* 46.0 31.1*  MCV 93.3  --  88.2  --  91.2  PLT 343  --  325  --  366    Basic Metabolic Panel: Recent Labs  Lab 09/27/20 1356 09/27/20 1401 09/28/20 0245 09/28/20 1108 09/28/20 1352 09/28/20 1745 09/29/20 0434  NA 137 135 139 138  --   --  138  K 4.7 4.5 3.6 3.9  --   --  4.4  CL 97* 101 100  --   --   --  101  CO2 19*  --  25  --   --   --  22  GLUCOSE 389* 384* 113*  --   --   --  280*  BUN 35* 33* 36*  --   --   --  51*  CREATININE 2.78* 2.60* 2.63*  --   --   --  3.32*  CALCIUM 9.1  --  8.6*  --   --   --  8.1*  MG  --   --   --   --  2.0 1.9 2.0  PHOS  --   --   --   --  5.4* 5.6* 5.6*   GFR: Estimated Creatinine Clearance: 17.4 mL/min (A) (by C-G formula based on SCr of 3.32 mg/dL (H)). Recent Labs  Lab 09/27/20 1356 09/28/20 0245 09/29/20 0434  WBC 18.2* 14.3* 12.1*    Liver Function Tests: Recent Labs  Lab 09/27/20 1356  AST 38  ALT 13   ALKPHOS 69  BILITOT 2.9*  PROT 6.2*  ALBUMIN 3.2*   No results for input(s): LIPASE, AMYLASE in the last 168 hours. No results for input(s): AMMONIA in the last 168 hours.  ABG    Component Value Date/Time   PHART 7.432 09/28/2020 1108   PCO2ART 36.5 09/28/2020 1108   PO2ART 89 09/28/2020 1108   HCO3 24.3 09/28/2020 1108   TCO2 25 09/28/2020 1108   O2SAT 97.0 09/28/2020 1108     Coagulation Profile: Recent Labs  Lab 09/27/20 1356  INR 1.1  Cardiac Enzymes: No results for input(s): CKTOTAL, CKMB, CKMBINDEX, TROPONINI in the last 168 hours.  HbA1C: Hgb A1c MFr Bld  Date/Time Value Ref Range Status  09/28/2020 02:45 AM 7.8 (H) 4.8 - 5.6 % Final    Comment:    (NOTE) Pre diabetes:          5.7%-6.4%  Diabetes:              >6.4%  Glycemic control for   <7.0% adults with diabetes   05/24/2016 12:09 AM 7.0 (H) 4.8 - 5.6 % Final    Comment:    (NOTE)         Pre-diabetes: 5.7 - 6.4         Diabetes: >6.4         Glycemic control for adults with diabetes: <7.0     CBG: Recent Labs  Lab 09/28/20 1914 09/28/20 2313 09/29/20 0305 09/29/20 0725 09/29/20 1127  GLUCAP 186* 173* 234* 355* 313*     Critical care time:    The patient is critically ill due to encephalopathy, respiratory failure.  Critical care was necessary to treat or prevent imminent or life-threatening deterioration.  Critical care was time spent personally by me on the following activities: development of treatment plan with patient and/or surrogate as well as nursing, discussions with consultants, evaluation of patient's response to treatment, examination of patient, obtaining history from patient or surrogate, ordering and performing treatments and interventions, ordering and review of laboratory studies, ordering and review of radiographic studies, pulse oximetry, re-evaluation of patient's condition and participation in multidisciplinary rounds.   Critical Care Time devoted to patient care  services described in this note is 32 minutes. This time reflects time of care of this [place] . This critical care time does not reflect separately billable procedures or procedure time, teaching time or supervisory time of PA/NP/Med student/Med Resident etc but could involve care discussion time.       [facility] 09/29/2020 11:35 AM  Pager: see AMION  If no response to pager , please call critical care on call (see AMION) until 7pm After 7:00 pm call Elink

## 2020-09-29 NOTE — Evaluation (Signed)
Speech Language Pathology Evaluation Patient Details Name: Kathleen [name] MRN: [id number] DOB: 1950-03-28 Today's Date: 09/29/2020 Time: 12-1608 SLP Time Calculation (min) (ACUTE ONLY): 20 min  Problem List:  Patient Active Problem List   Diagnosis Date Noted   Pressure injury of skin 09/28/2020   Stroke (cerebrum) ([place]) 09/27/2020   Uncontrolled type 2 diabetes mellitus with hypoglycemia without coma ([place]) 08/26/2017   Hypertensive heart disease 06/23/2016   CRI (chronic renal insufficiency), stage 3 (moderate) ([place]) 06/23/2016   Left rib fracture 05/25/2016   Pneumothorax on left 05/23/2016   PVC's (premature ventricular contractions) 11/28/2015   OSA (obstructive sleep apnea) 04/30/2015   Obesity (BMI 30-39.9) 04/30/2015   Status post gastric banding 02/27/2015   Varicose veins of leg with complications 56/38/7564   Metatarsal deformity 10/04/2013   Nonspecific abnormal unspecified cardiovascular function study 09/29/2013   Mixed hyperlipidemia 09/22/2013   Tenosynovitis of foot and ankle 09/14/2013   Pronation deformity of ankle, acquired 09/14/2013   Deformity of metatarsal bone of right foot 09/14/2013   Deformity of metatarsal bone of left foot 09/14/2013   Proteinuria 07/28/2012   DYSPNEA 01/11/2008   HYPERTHYROIDISM 05/10/2007   UNSPECIFIED DISORDER OF THYROID 05/06/2007   DIABETES, TYPE 2 03/22/2007   HYPERLIPIDEMIA 03/22/2007   Essential hypertension 03/22/2007   C O P D 03/22/2007   MEDIASTINAL ADENOPATHY CASTLEMANS D 03/22/2007   OXYGEN-USE OF SUPPLEMENTAL 03/22/2007   Past Medical History:  Past Medical History:  Diagnosis Date   Carotid artery plaque    duplex 07/2020 1-39% BICA   CKD (chronic kidney disease), stage IV ([facility])    Complication of anesthesia    pt states stopped breathing when having tonsillectomy at age 70; pt states has had anesthesia since then without difficulty    Diabetes mellitus    Glaucoma    Heart disease    Hyperlipidemia     Hypertension    Hyperthyroidism    Menopause    Mixed hyperlipidemia 09/22/2013   Morbid obesity ([place])    Multinodular goiter    AND HYPERTHYROIDISM    Obesity (BMI 30-39.9) 04/30/2015   OSA on CPAP    Proliferative retinopathy    moderate, Dr. Posey Pronto   Proteinuria    Dr. Hassell Done   Pulmonary hypertension ([place])    PVC's (premature ventricular contractions)    Right-sided heart failure ([place])    Sinus bradycardia    Trigger finger    Dr. Rip Harbour   Past Surgical History:  Past Surgical History:  Procedure Laterality Date   GASTRIC BANDING PORT REVISION N/A 02/27/2015   Procedure: GASTRIC BANDING PORT REVISION;  Surgeon: Johnathan Hausen, MD;  Location: [facility];  Service: General;  Laterality: N/A;   IR CT HEAD LTD  09/27/2020   IR PERCUTANEOUS ART THROMBECTOMY/INFUSION INTRACRANIAL INC DIAG ANGIO  09/27/2020   IR US GUIDE VASC ACCESS RIGHT  09/27/2020   LAPAROSCOPIC GASTRIC BANDING  2010   NERVE SURGERY  2001   RADIOLOGY WITH ANESTHESIA N/A 09/27/2020   Procedure: IR WITH ANESTHESIA;  Surgeon: Radiologist, Medication, MD;  Location: [facility];  Service: Radiology;  Laterality: N/A;   SPLIT NIGHT STUDY  08/03/2015   TONSILLECTOMY  1958   HPI:  Pt is a 70 y.o. F who presents with right hemiplegia, facial droop and left gaze. Found to have distal left M1 occlusion s/p thrombectomy. Pt with new onset paroxysmal afib and acute respiratory insufficiencies with concern for ability to protect airway. Significant PMH: CKD stage 4, DM, CHF, morbid obesity, OSA  on CPAP.   Assessment / Plan / Recommendation Clinical Impression  Patient presents with a global aphasia with expressive language severely impaired and receptive language moderately impaired. Patient did not demonstrate any attempts to vocalize or verbalize. She would mainly demonstrate attention via eye gaze. She was able to follow a few one-step verbal commands to 'open mouth', 'grab my hand' when given visual cues as well. She answered yes/no  questions via head nod for yes when SLP correctly said her name and gave a quizzical look when SLP incorrectly said her name. Patient did start to smile a few times towards end of session when SLP complimenting her for trying hard during evaluation. She did exhibit some suspected apraxia, and required significant cues to redirect when she would perseverate on a body movement/gesture and not be able to switch to another. (after first cued to raise hand up, she continued to raise hand up when SLP was asking her to nod head yes. Towards end of session, patient was able to direct her attention to right visual field when SLP moved to right side of her hospital bed. She also was able to demonstrate crossing midline one time when cued to reach for SLP's hand. Assessment of patient's cognitive function will be ongoing as her ability to communicate improves.    SLP Assessment  SLP Recommendation/Assessment: Patient needs continued Speech Lanaguage Pathology Services SLP Visit Diagnosis: Aphasia (R47.01);Apraxia (R48.2)    Follow Up Recommendations  Inpatient Rehab;24 hour supervision/assistance    Frequency and Duration min 3x week  2 weeks      SLP Evaluation Cognition  Overall Cognitive Status: Difficult to assess Arousal/Alertness: Awake/alert Orientation Level: Oriented to person Attention: Sustained Sustained Attention: Impaired Sustained Attention Impairment: Verbal basic;Functional basic Memory:  (unable to assess due to severe aphasia) Executive Function: Initiating Initiating: Impaired Initiating Impairment: Functional basic       Comprehension  Auditory Comprehension Overall Auditory Comprehension: Impaired Yes/No Questions: Impaired Basic Biographical Questions: 26-50% accurate Interfering Components: Attention;Processing speed;Motor planning EffectiveTechniques: Extra processing time;Visual/Gestural cues Visual Recognition/Discrimination Discrimination: Not tested Reading  Comprehension Reading Status: Not tested    Expression Expression Primary Mode of Expression: Verbal Verbal Expression Overall Verbal Expression: Impaired Initiation: Impaired Interfering Components: Attention Non-Verbal Means of Communication: Eye gaze Written Expression Dominant Hand: Right Written Expression: Not tested   Oral / Motor  Oral Motor/Sensory Function Overall Oral Motor/Sensory Function: Other (comment) (difficult to fully assess as patient unable to follow majority of commands for oral motor movement) Lingual ROM: Other (Comment) (unable to demonstrate any lingual movement)   GO                    Sonia Baller, MA, CCC-SLP Speech Therapy

## 2020-09-29 NOTE — Progress Notes (Signed)
Patient ID: Kathleen [name], female   DOB: 1950/09/02, 70 y.o.   MRN: [id number] STROKE TEAM PROGRESS NOTE   INTERVAL HISTORY  RN and family is at the bedside.  She refused biPAP yesterday. On venti mask at 4 Liters. She has no complaints.  No headache, no SOB. Vitals:   09/29/20 1200 09/29/20 1206 09/29/20 1400 09/29/20 1500  BP: (!) 138/55  (!) 137/52 (!) 144/62  Pulse: 71 72 63 (!) 59  Resp: (!) 23 19 20 20   Temp: 98.9 F (37.2 C)     TempSrc: Axillary     SpO2: 95% 96% 95% 96%  Weight:      Height:       CBC:  Recent Labs  Lab 09/27/20 1356 09/27/20 1401 09/28/20 0245 09/28/20 1108 09/29/20 0434  WBC 18.2*  --  14.3*  --  12.1*  NEUTROABS 15.9*  --   --   --   --   HGB 12.2   < > 10.6* 15.6* 9.9*  HCT 38.9   < > 32.0* 46.0 31.1*  MCV 93.3  --  88.2  --  91.2  PLT 343  --  325  --  289   < > = values in this interval not displayed.   Basic Metabolic Panel:  Recent Labs  Lab 09/28/20 0245 09/28/20 1108 09/28/20 1352 09/28/20 1745 09/29/20 0434  NA 139 138  --   --  138  K 3.6 3.9  --   --  4.4  CL 100  --   --   --  101  CO2 25  --   --   --  22  GLUCOSE 113*  --   --   --  280*  BUN 36*  --   --   --  51*  CREATININE 2.63*  --   --   --  3.32*  CALCIUM 8.6*  --   --   --  8.1*  MG  --   --    < > 1.9 2.0  PHOS  --   --    < > 5.6* 5.6*   < > = values in this interval not displayed.   Lipid Panel: No results for input(s): CHOL, TRIG, HDL, CHOLHDL, VLDL, LDLCALC in the last 168 hours. HgbA1c:  Recent Labs  Lab 09/28/20 0245  HGBA1C 7.8*   Urine Drug Screen:  Recent Labs  Lab 09/28/20 0951  LABOPIA NONE DETECTED  COCAINSCRNUR NONE DETECTED  LABBENZ NONE DETECTED  AMPHETMU NONE DETECTED  THCU NONE DETECTED  LABBARB NONE DETECTED    Alcohol Level  Recent Labs  Lab 09/27/20 1356  ETH <10    IMAGING past 24 hours DG CHEST PORT 1 VIEW  Result Date: 09/28/2020 CLINICAL DATA:  Respiratory failure. Pneumonia. COMPARISON:  09/28/2020 FINDINGS:  New feeding tube is seen entering the stomach. Mild cardiomegaly remains stable. Pulmonary opacity in the central right upper lobe remains stable, suspicious for pneumonia. No evidence of pleural effusion. IMPRESSION: New feeding tube is seen entering the stomach. No significant change in right upper lobe opacity, suspicious for pneumonia. Electronically Signed   By: Marlaine Hind M.D.   On: 09/28/2020 19:34   ECHOCARDIOGRAM COMPLETE  Result Date: 09/28/2020    ECHOCARDIOGRAM REPORT   Patient Name:   Kathleen [name] Date of Exam: 09/28/2020 Medical Rec #:  [id number]        Height:       63.0 in Accession #:    [id number]  Weight:       211.6 lb Date of Birth:  Mar 15, 1950         BSA:          1.981 m Patient Age:    57 years         BP:           126/57 mmHg Patient Gender: F                HR:           63 bpm. Exam Location:  Inpatient Procedure: 2D Echo, Cardiac Doppler, Color Doppler and Intracardiac            Opacification Agent Indications:    Stroke  History:        Patient has prior history of Echocardiogram examinations, most                 recent 06/19/2020. COPD, Arrythmias:PVC; Risk Factors:Diabetes.  Sonographer:    Clayton Lefort RDCS (AE) Referring Phys: [id number] [facility]  Sonographer Comments: Image acquisition challenging due to respiratory motion. IMPRESSIONS  1. Left ventricular ejection fraction, by estimation, is 60 to 65%. The left ventricle has normal function. The left ventricle has no regional wall motion abnormalities. There is moderate concentric left ventricular hypertrophy. Left ventricular diastolic parameters are consistent with Grade II diastolic dysfunction (pseudonormalization). Elevated left ventricular end-diastolic pressure.  2. Right ventricular systolic function is normal. The right ventricular size is normal. There is normal pulmonary artery systolic pressure.  3. Left atrial size was severely dilated.  4. Right atrial size was moderately dilated.  5. The mitral valve is  degenerative. No evidence of mitral valve regurgitation. No evidence of mitral stenosis.  6. The aortic valve is tricuspid. Aortic valve regurgitation is not visualized. Mild aortic valve stenosis. Aortic valve area, by VTI measures 1.59 cm. Aortic valve mean gradient measures 13.0 mmHg. Aortic valve Vmax measures 2.57 m/s.  7. The inferior vena cava is dilated in size with >50% respiratory variability, suggesting right atrial pressure of 8 mmHg. FINDINGS  Left Ventricle: Left ventricular ejection fraction, by estimation, is 60 to 65%. The left ventricle has normal function. The left ventricle has no regional wall motion abnormalities. Definity contrast agent was given IV to delineate the left ventricular  endocardial borders. The left ventricular internal cavity size was normal in size. There is moderate concentric left ventricular hypertrophy. Left ventricular diastolic parameters are consistent with Grade II diastolic dysfunction (pseudonormalization).  Elevated left ventricular end-diastolic pressure. Right Ventricle: The right ventricular size is normal. No increase in right ventricular wall thickness. Right ventricular systolic function is normal. There is normal pulmonary artery systolic pressure. The tricuspid regurgitant velocity is 2.42 m/s, and  with an assumed right atrial pressure of 8 mmHg, the estimated right ventricular systolic pressure is 79.0 mmHg. Left Atrium: Left atrial size was severely dilated. Right Atrium: Right atrial size was moderately dilated. Pericardium: Trivial pericardial effusion is present. Mitral Valve: The mitral valve is degenerative in appearance. No evidence of mitral valve regurgitation. No evidence of mitral valve stenosis. MV peak gradient, 7.6 mmHg. The mean mitral valve gradient is 3.0 mmHg. Tricuspid Valve: The tricuspid valve is normal in structure. Tricuspid valve regurgitation is trivial. No evidence of tricuspid stenosis. Aortic Valve: The aortic valve is  tricuspid. Aortic valve regurgitation is not visualized. Mild aortic stenosis is present. Aortic valve mean gradient measures 13.0 mmHg. Aortic valve peak gradient measures 26.4 mmHg. Aortic valve area, by VTI  measures 1.59 cm. Pulmonic Valve: The pulmonic valve was normal in structure. Pulmonic valve regurgitation is not visualized. No evidence of pulmonic stenosis. Aorta: The aortic root is normal in size and structure. Venous: The inferior vena cava is dilated in size with greater than 50% respiratory variability, suggesting right atrial pressure of 8 mmHg. IAS/Shunts: No atrial level shunt detected by color flow Doppler.  LEFT VENTRICLE PLAX 2D LVIDd:         4.90 cm  Diastology LVIDs:         3.10 cm  LV e' medial:    7.29 cm/s LV PW:         1.40 cm  LV E/e' medial:  20.0 LV IVS:        1.60 cm  LV e' lateral:   9.14 cm/s LVOT diam:     1.80 cm  LV E/e' lateral: 16.0 LV SV:         93 LV SV Index:   47 LVOT Area:     2.54 cm  RIGHT VENTRICLE             IVC RV Basal diam:  3.10 cm     IVC diam: 2.70 cm RV S prime:     15.70 cm/s TAPSE (M-mode): 2.5 cm LEFT ATRIUM             Index       RIGHT ATRIUM           Index LA diam:        4.00 cm 2.02 cm/m  RA Area:     22.30 cm LA Vol (A2C):   74.8 ml 37.76 ml/m RA Volume:   65.40 ml  33.02 ml/m LA Vol (A4C):   80.7 ml 40.74 ml/m LA Biplane Vol: 80.0 ml 40.39 ml/m  AORTIC VALVE AV Area (Vmax):    1.44 cm AV Area (Vmean):   1.47 cm AV Area (VTI):     1.59 cm AV Vmax:           257.00 cm/s AV Vmean:          168.000 cm/s AV VTI:            0.584 m AV Peak Grad:      26.4 mmHg AV Mean Grad:      13.0 mmHg LVOT Vmax:         145.00 cm/s LVOT Vmean:        96.900 cm/s LVOT VTI:          0.364 m LVOT/AV VTI ratio: 0.62  AORTA Ao Root diam: 2.70 cm Ao Asc diam:  3.20 cm MITRAL VALVE                TRICUSPID VALVE MV Area (PHT): 2.60 cm     TR Peak grad:   23.4 mmHg MV Area VTI:   2.12 cm     TR Vmax:        242.00 cm/s MV Peak grad:  7.6 mmHg MV Mean grad:  3.0  mmHg     SHUNTS MV Vmax:       1.38 m/s     Systemic VTI:  0.36 m MV Vmean:      72.2 cm/s    Systemic Diam: 1.80 cm MV Decel Time: 292 msec MV E velocity: 146.00 cm/s MV A velocity: 99.30 cm/s MV E/A ratio:  1.47 Skeet Latch MD Electronically signed by Skeet Latch MD Signature Date/Time: 09/28/2020/7:07:22 PM    Final  PHYSICAL EXAM General: Appears well-developed; on [place]. No acute distress. Appears comfortable. HENT: [place] in place. Head: Normocephalic.  Skin: no obvious lesions.    Neurological Examination Mental Status: Alert, awake. Will track examiner. Appears to nod yes to questions but not consistent. Aphasic. Non verbal. Will mimic movement such as raising arms.Exam limited due to aphasia. Cranial Nerves: Left gaze pref, but will look to right and cross midline. Right facial droop.  Motor:Tone and bulk:normal tone throughout; moving left side wnl. Right side weakness 4/5. Deep Tendon Reflexes: 2+ and symmetric throughout Plantars: Right: downgoing   Left: downgoing Cerebellar: unable due to aphasia Gait: unable due to aphasia  ASSESSMENT/PLAN [name] is a 70 y.o. female acute right hemiplegia, right facial droop, leftward gaze and global aphasia. NIHSS 27 on admission. CTA showed distal Left M1 occlusion with favorable perfusion missmatich. She was outside of time window for IV tPA, but was emergently treated with thrombectomy with TICI 3 restoration of flow.   Stroke:  left MCA infarct; cardioembolic secondary to Afib (new dx, not on [place])  Code Stroke CT head No acute abnormality. Small vessel disease. Atrophy. ASPECTS 7.   CTA head & neck distal left M1 or proximal M2 MCA in the anterior aspect of the left sylvian fissure (series 10, images 85 through 88) which correlates with the hyperdensity seen on same day noncontrast head CT and is concerning for a high-grade stenosis or occlusion. The remainder of the CTA is essentially nondiagnostic in the head and  neck due to non arterial timing. CT perfusion 50ml core in anterior LMCA. 43ml detected penumbra MRI  completed. Acute left MCA ischemic CVA. Petechial hemorrhage. No shift.  MRA revascularization of proximal MCA noted. 2D Echo completed. EF 60-65%. Left atrial dilatation. No shunt/no thrombus. LDL 91 HgbA1c 7.8 VTE prophylaxis - scds    Diet   Diet NPO time specified   none  prior to admission, now on aspirin 300 mg suppository daily. Till cortrak is placed and then switch to per tube Therapy recommendations:  pending Disposition:  pending  Hypertension Home meds:  Aldactone, norvasc, coreg, chlorthalidone Stable Permissive hypertension (OK if < 220/120) but gradually normalize in 5-7 days Long-term BP goal normotensive  Hyperlipidemia Home meds:  lipitor 80mg , resumed in hospital LDL 91, goal < 70 Add   High intensity statin  Continue statin at discharge  Diabetes type II Uncontrolled Home meds:  Farxiga, levemir flexpen, novolog HgbA1c 7.8, goal < 7.0 CBGs Recent Labs    09/29/20 0725 09/29/20 1127 09/29/20 1533  GLUCAP 355* 313* 243*    SSI Sugars still elevated. Increase insulin to BID, dicussed with pharm D. Will defer to ICU to place order.  Pulmonary: did not want bipap. On venti at 4 Liters tolerating it well. ICU helping with mgt.  Other Stroke Risk Factors Advanced Age >/= 66  Former Cigarette smoker; quit 74yrs ago Obesity, Body mass index is 37.76 kg/m., BMI >/= 30 associated with increased stroke risk, recommend weight loss, diet and exercise as appropriate  CAD Carotid artery disease Obstructive sleep apnea, on CPAP at home- RT consulted for to add this for daytime or nighttime sleeping.  Right side heart failure- stopped IVF and ordred Lasix 20mg . Foley catheter for strict I&Os.  New dx of Afib- 12lead ordered to confirm-not done yet.   Other Active Problems Morbid obesity- s/p gastric banding in 2010 with revision in 2020 Hyperthyroidism- on  methimazole at home Dysphasia- d/t stroke- will order Cortrak for  nutrition and meds  Hospital day # 2  Egbert Garibaldi, MD  This patient is critically ill due to left MCA stroke, status post thrombectomy, new diagnosed A. fib, respiratory distress and at significant risk of neurological worsening, death form respiratory failure, recurrent stroke, hemorrhagic conversion, seizure, heart failure. This patient's care requires constant monitoring of vital signs, hemodynamics, respiratory and cardiac monitoring, review of multiple databases, neurological assessment, discussion with family, other specialists and medical decision making of high complexity. I spent 30 minutes of neurocritical care time in the care of this patient.

## 2020-09-29 NOTE — Evaluation (Signed)
Physical Therapy Evaluation Patient Details Name: Kathleen [name] MRN: [id number] DOB: 04/19/1950 Today's Date: 09/29/2020   History of Present Illness  Pt is a 70 y.o. F who presents with right hemiplegia, facial droop and left gaze. Found to have distal left M1 occlusion s/p thrombectomy. Pt with new onset paroxysmal afib and acute respiratory insufficiencies with concern for ability to protect airway. Significant PMH: CKD stage 4, DM, CHF, morbid obesity, OSA on CPAP.  Clinical Impression  Pt admitted with above. Pt making no attempts to verbalize or communicate via head nods, however, did follow 50% of one step commands and tracked past midline towards right. Placed bed in chair position to promote upright. Pt tolerated well; SpO2 96% on 40% FiO2, 8L O2 via venturi mask, HR 68, BP 149/47. Able to participate in bed level exercises including pulling on bed rail with left upper extremity to bring trunk into unsupported position. Further mobility deferred this date in light of tenuous respiratory status. Pt displays right hemiplegia, cognitive deficits, balance impairments. Will continue to progress mobility as tolerated. Recommend post acute rehab to address deficits and maximize functional mobility.     Follow Up Recommendations CIR    Equipment Recommendations  Other (comment) (TBA)    Recommendations for Other Services Rehab consult     Precautions / Restrictions Precautions Precautions: Fall;Other (comment) Precaution Comments: R hemiplegia, venti mask, NGT Restrictions Weight Bearing Restrictions: No      Mobility  Bed Mobility Overal bed mobility: Needs Assistance             General bed mobility comments: Bed placed in chair position to promote upright. With back unsupported, pt requiring minA    Transfers                 General transfer comment: deferred due to respiratory status  Ambulation/Gait                Stairs            Wheelchair  Mobility    Modified Rankin (Stroke Patients Only) Modified Rankin (Stroke Patients Only) Pre-Morbid Rankin Score: No symptoms Modified Rankin: Severe disability     Balance                                             Pertinent Vitals/Pain Pain Assessment: Faces Faces Pain Scale: Hurts a little bit Pain Location: grimacing to noxious stimuli on right side Pain Descriptors / Indicators: Grimacing Pain Intervention(s): Monitored during session    Home Living Family/patient expects to be discharged to:: Unsure Living Arrangements: Alone                    Prior Function           Comments: Pt unable to provide PLOF; living alone, so assume independent     Hand Dominance        Extremity/Trunk Assessment   Upper Extremity Assessment Upper Extremity Assessment: Defer to OT evaluation    Lower Extremity Assessment Lower Extremity Assessment: RLE deficits/detail;LLE deficits/detail RLE Deficits / Details: Grossly 1/5 LLE Deficits / Details: WFL       Communication   Communication: Other (comment) (nonverbal)  Cognition Arousal/Alertness: Awake/alert Behavior During Therapy: Flat affect Overall Cognitive Status: Difficult to assess Area of Impairment: Following commands  Following Commands: Follows one step commands inconsistently       General Comments: Pt making no attempt to verbalize or communicate via head nods. Able to point to correct name written on sheet with assist for initiation of arm elevation. Pt following ~50% of 1 step commands, able to cross midline once sitting up in chair position in bed. Did not engage in functional task of washing face with washcloth      General Comments      Exercises General Exercises - Lower Extremity Short Arc Quad: AAROM;Both;5 reps;Other (comment) (bed in chair position) Other Exercises Other Exercises: Bed in chair position: Sit ups x 5 pulling with  LUE to bring shoulders/back off bed   Assessment/Plan    PT Assessment Patient needs continued PT services  PT Problem List Decreased strength;Decreased activity tolerance;Decreased balance;Decreased mobility;Decreased cognition       PT Treatment Interventions DME instruction;Gait training;Therapeutic activities;Functional mobility training;Therapeutic exercise;Balance training;Patient/family education    PT Goals (Current goals can be found in the Care Plan section)  Acute Rehab PT Goals Patient Stated Goal: unable PT Goal Formulation: Patient unable to participate in goal setting Time For Goal Achievement: 10/13/20 Potential to Achieve Goals: Fair    Frequency Min 4X/week   Barriers to discharge        Co-evaluation PT/OT/SLP Co-Evaluation/Treatment: Yes Reason for Co-Treatment: Complexity of the patient's impairments (multi-system involvement);Necessary to address cognition/behavior during functional activity;To address functional/ADL transfers PT goals addressed during session: Mobility/safety with mobility;Strengthening/ROM         AM-PAC PT "6 Clicks" Mobility  Outcome Measure Help needed turning from your back to your side while in a flat bed without using bedrails?: A Lot Help needed moving from lying on your back to sitting on the side of a flat bed without using bedrails?: Total Help needed moving to and from a bed to a chair (including a wheelchair)?: Total Help needed standing up from a chair using your arms (e.g., wheelchair or bedside chair)?: Total Help needed to walk in hospital room?: Total Help needed climbing 3-5 steps with a railing? : Total 6 Click Score: 7    End of Session Equipment Utilized During Treatment: Oxygen Activity Tolerance: Patient tolerated treatment well Patient left: in bed;with call bell/phone within reach;with bed alarm set Nurse Communication: Mobility status PT Visit Diagnosis: Other abnormalities of gait and mobility  (R26.89);Hemiplegia and hemiparesis Hemiplegia - Right/Left: Right Hemiplegia - caused by: Cerebral infarction    Time: 7116-5790 PT Time Calculation (min) (ACUTE ONLY): 19 min   Charges:   PT Evaluation $PT Eval Moderate Complexity: 1 Mod          Wyona Almas, PT, DPT Acute Rehabilitation Services Pager [phone] Office [phone]   Deno Etienne 09/29/2020, 10:23 AM

## 2020-09-29 NOTE — Evaluation (Signed)
Occupational Therapy Evaluation Patient Details Name: Kathleen [name] MRN: [id number] DOB: 05/09/1950 Today's Date: 09/29/2020    History of Present Illness Pt is a 70 y.o. F who presents with right hemiplegia, facial droop and left gaze. Found to have distal left M1 occlusion s/p thrombectomy. Pt with new onset paroxysmal afib and acute respiratory insufficiencies with concern for ability to protect airway. Significant PMH: CKD stage 4, DM, CHF, morbid obesity, OSA on CPAP.   Clinical Impression   Pt PTA: Pt's family reports independence. Pt current limited by very weak R side, decreased ability to care for self and decreased mobility. Pt totalA for ADL and maxA for long sitting in bed.  Pt with L gaze preference and cues required to turn head to R. Pt more alert with chair position in bed. Pt would benefit from continued OT skilled services. OT following acutely.      Follow Up Recommendations  CIR    Equipment Recommendations  3 in 1 bedside commode;Wheelchair (measurements OT);Wheelchair cushion (measurements OT);Hospital bed    Recommendations for Other Services Rehab consult     Precautions / Restrictions Precautions Precautions: Fall;Other (comment) Precaution Comments: R hemiplegia, venti mask, NGT Restrictions Weight Bearing Restrictions: No      Mobility Bed Mobility Overal bed mobility: Needs Assistance             General bed mobility comments: Bed placed in chair position to promote upright. With back unsupported, pt requiring minA    Transfers                 General transfer comment: deferred due to respiratory status    Balance                                           ADL either performed or assessed with clinical judgement   ADL Overall ADL's : Needs assistance/impaired                                     Functional mobility during ADLs: Maximal assistance (for long sitting) General ADL Comments: pt  totalA for all ADL tasks as pt unable to initiate tasks     Vision Baseline Vision/History: Wears glasses Patient Visual Report: Other (comment) (L gaze preference, scanning) Vision Assessment?: Yes;Vision impaired- to be further tested in functional context Eye Alignment: Within Functional Limits Ocular Range of Motion: Within Functional Limits Alignment/Gaze Preference: Gaze left Tracking/Visual Pursuits: Impaired - to be further tested in functional context;Right eye does not track laterally Additional Comments: pt with cues turns head to R     Perception     Praxis      Pertinent Vitals/Pain Pain Assessment: Faces Faces Pain Scale: Hurts a little bit Pain Location: grimacing to noxious stimuli on right side Pain Descriptors / Indicators: Grimacing Pain Intervention(s): Monitored during session     Hand Dominance Right   Extremity/Trunk Assessment Upper Extremity Assessment Upper Extremity Assessment: Generalized weakness;RUE deficits/detail;LUE deficits/detail RUE Deficits / Details: minimal AROM at shoulder noted, edema RUE Coordination: decreased fine motor;decreased gross motor LUE Deficits / Details: Pincus Large   Lower Extremity Assessment Lower Extremity Assessment: RLE deficits/detail RLE Deficits / Details: weakness LLE Deficits / Details: WFL       Communication Communication Communication: Other (comment) (nonverbal)   Cognition Arousal/Alertness: Awake/alert  Behavior During Therapy: Flat affect Overall Cognitive Status: Difficult to assess Area of Impairment: Following commands                       Following Commands: Follows one step commands inconsistently       General Comments: Pt making no attempt to verbalize or communicate via head nods. Able to point to correct name written on sheet with assist for initiation of arm elevation. Pt following ~50% of 1 step commands, able to cross midline once sitting up in chair position in bed. Did  not engage in functional task of washing face with washcloth.   General Comments  40% venturi mask on 8L O2 >93% O2; BP 151/58 in chair position.    Exercises Exercises: General Lower Extremity;Other exercises General Exercises - Lower Extremity Short Arc Quad:  (bed in chair position) Other Exercises Other Exercises: Bed in chair position: Sit ups x 70for long sitting   Shoulder Instructions      Home Living Family/patient expects to be discharged to:: Unsure Living Arrangements: Alone                                      Prior Functioning/Environment          Comments: Pt unable to provide PLOF; living alone, so assume independent        OT Problem List: Decreased strength;Decreased activity tolerance;Impaired balance (sitting and/or standing);Impaired vision/perception;Decreased coordination;Decreased cognition;Decreased safety awareness      OT Treatment/Interventions: Self-care/ADL training;Therapeutic exercise;Neuromuscular education;Energy conservation;Therapeutic activities;Cognitive remediation/compensation;Visual/perceptual remediation/compensation;Patient/family education;Balance training    OT Goals(Current goals can be found in the care plan section) Acute Rehab OT Goals Patient Stated Goal: unable OT Goal Formulation: Patient unable to participate in goal setting Time For Goal Achievement: 10/13/20 Potential to Achieve Goals: Fair ADL Goals Pt Will Perform Eating: with min assist;with adaptive utensils;sitting Pt Will Perform Grooming: with min assist;sitting Pt/caregiver will Perform Home Exercise Program: Both right and left upper extremity;With minimal assist;With written HEP provided Additional ADL Goal #1: Pt will increase to x5 mins of functional tasks with minimal cues to attend to task. Additional ADL Goal #2: Pt will increase to modA for bed mobility as precursor for ADL.  OT Frequency: Min 2X/week   Barriers to D/C:             Co-evaluation PT/OT/SLP Co-Evaluation/Treatment: Yes Reason for Co-Treatment: Complexity of the patient's impairments (multi-system involvement);To address functional/ADL transfers;For patient/therapist safety PT goals addressed during session: Mobility/safety with mobility;Strengthening/ROM OT goals addressed during session: ADL's and self-care      AM-PAC OT "6 Clicks" Daily Activity     Outcome Measure Help from another person eating meals?: Total Help from another person taking care of personal grooming?: Total Help from another person toileting, which includes using toliet, bedpan, or urinal?: Total Help from another person bathing (including washing, rinsing, drying)?: Total Help from another person to put on and taking off regular upper body clothing?: Total Help from another person to put on and taking off regular lower body clothing?: Total 6 Click Score: 6   End of Session Equipment Utilized During Treatment: Oxygen Nurse Communication: Mobility status  Activity Tolerance: Patient limited by lethargy Patient left: in bed;with bed alarm set;with call bell/phone within reach  OT Visit Diagnosis: Unsteadiness on feet (R26.81);Muscle weakness (generalized) (M62.81);Cognitive communication deficit (R41.841);Hemiplegia and hemiparesis Symptoms and signs involving cognitive functions:  Cerebral infarction Hemiplegia - Right/Left: Right Hemiplegia - dominant/non-dominant: Dominant Hemiplegia - caused by: Cerebral infarction                Time: 5056-9794 OT Time Calculation (min): 20 min Charges:  OT General Charges $OT Visit: 1 Visit OT Evaluation $OT Eval Moderate Complexity: 1 Mod  Jefferey Pica, OTR/L Acute Rehabilitation Services Pager: [phone] Office: [place] 09/29/2020, 1:52 PM

## 2020-09-29 NOTE — Progress Notes (Signed)
Per day shift team, pt to be placed on cpap/bipap overnight for baseline OSA. RN discussed with RT; pt continues to not be able to control or expel secretions with frequent oropharyngeal/nasopharyngeal suctioning. Remains minimally responsive. Post-pyloric cortrak in place with bridle securement. RT placed pt on cpap settings; RN will closely monitor.

## 2020-09-29 NOTE — Progress Notes (Signed)
[facility] Progress Note Patient Name: Kathleen [name] DOB: Mar 29, 1950 MRN: [id number]   Date of Service  09/29/2020  HPI/Events of Note  Glucose 280 despite high dose SSI Creatinine 3.32  eICU Interventions  Start basal insulin 8 units for now. May increase to BID depending on response. Cautious due to renal insufficiency     Intervention Category Major Interventions: Hyperglycemia - active titration of insulin therapy  Judd Lien 09/29/2020, 6:35 AM

## 2020-09-29 NOTE — Progress Notes (Signed)
Inpatient Rehab Admissions Coordinator Note:   Per therapy recommendations, pt was screened for CIR candidacy by Clemens Catholic, [place] CCC-SLP. At this time, therapy evals limited due to respiratory status, so I cannot make a determination on CIR candidacy. However, pt. Appears likely to progress to being an appropriate candidate once medically stable. [facility] team will follow for progress and participation with therapies and place consult orders if Pt. Appears to be an appropriate candidate.   Clemens Catholic, [place], [place] Admissions Coordinator  [phone] ([place]) [phone] (office)

## 2020-09-29 NOTE — Progress Notes (Signed)
HCP paperwork brought in by nephew today. Copies made and placed in shadow chart.

## 2020-09-30 DIAGNOSIS — Z9989 Dependence on other enabling machines and devices: Secondary | ICD-10-CM | POA: Diagnosis not present

## 2020-09-30 DIAGNOSIS — J9601 Acute respiratory failure with hypoxia: Secondary | ICD-10-CM | POA: Diagnosis not present

## 2020-09-30 DIAGNOSIS — G4733 Obstructive sleep apnea (adult) (pediatric): Secondary | ICD-10-CM | POA: Diagnosis not present

## 2020-09-30 DIAGNOSIS — I639 Cerebral infarction, unspecified: Secondary | ICD-10-CM | POA: Diagnosis not present

## 2020-09-30 LAB — BASIC METABOLIC PANEL
Anion gap: 12 (ref 5–15)
BUN: 66 mg/dL — ABNORMAL HIGH (ref 8–23)
CO2: 24 mmol/L (ref 22–32)
Calcium: 7.9 mg/dL — ABNORMAL LOW (ref 8.9–10.3)
Chloride: 104 mmol/L (ref 98–111)
Creatinine, Ser: 3.67 mg/dL — ABNORMAL HIGH (ref 0.44–1.00)
GFR, Estimated: 13 mL/min — ABNORMAL LOW (ref >60)
Glucose, Bld: 229 mg/dL — ABNORMAL HIGH (ref 70–99)
Potassium: 4.6 mmol/L (ref 3.5–5.1)
Sodium: 140 mmol/L (ref 135–145)

## 2020-09-30 LAB — CBC
HCT: 31.6 % — ABNORMAL LOW (ref 36.0–46.0)
Hemoglobin: 10.5 g/dL — ABNORMAL LOW (ref 12.0–15.0)
MCH: 29.6 pg (ref 26.0–34.0)
MCHC: 33.2 g/dL (ref 30.0–36.0)
MCV: 89 fL (ref 80.0–100.0)
Platelets: 262 10*3/uL (ref 150–400)
RBC: 3.55 MIL/uL — ABNORMAL LOW (ref 3.87–5.11)
RDW: 15.1 % (ref 11.5–15.5)
WBC: 9.9 10*3/uL (ref 4.0–10.5)
nRBC: 0 % (ref 0.0–0.2)

## 2020-09-30 LAB — LIPID PANEL
Cholesterol: 140 mg/dL (ref 0–200)
HDL: 50 mg/dL (ref >40)
LDL Cholesterol: 77 mg/dL (ref 0–99)
Total CHOL/HDL Ratio: 2.8 RATIO
Triglycerides: 63 mg/dL (ref <150)
VLDL: 13 mg/dL (ref 0–40)

## 2020-09-30 LAB — MAGNESIUM: Magnesium: 2.1 mg/dL (ref 1.7–2.4)

## 2020-09-30 LAB — GLUCOSE, CAPILLARY
Glucose-Capillary: 225 mg/dL — ABNORMAL HIGH (ref 70–99)
Glucose-Capillary: 234 mg/dL — ABNORMAL HIGH (ref 70–99)
Glucose-Capillary: 260 mg/dL — ABNORMAL HIGH (ref 70–99)
Glucose-Capillary: 279 mg/dL — ABNORMAL HIGH (ref 70–99)
Glucose-Capillary: 210 mg/dL — ABNORMAL HIGH (ref 70–99)
Glucose-Capillary: 151 mg/dL — ABNORMAL HIGH (ref 70–99)

## 2020-09-30 MED ORDER — POLYETHYLENE GLYCOL 3350 17 G PO PACK
17.0000 g | PACK | Freq: Every day | ORAL | Status: DC
  Filled 2020-09-30: qty 1

## 2020-09-30 MED ORDER — POLYETHYLENE GLYCOL 3350 17 G PO PACK
17.0000 g | PACK | Freq: Every day | ORAL | Status: DC
  Administered 2020-09-30 – 2020-10-08 (×7): 17 g
  Filled 2020-09-30 (×6): qty 1

## 2020-09-30 MED ORDER — INSULIN DETEMIR 100 UNIT/ML ~~LOC~~ SOLN
12.0000 [IU] | Freq: Two times a day (BID) | SUBCUTANEOUS | Status: DC
  Administered 2020-09-30: 12 [IU] via SUBCUTANEOUS
  Filled 2020-09-30 (×3): qty 0.12

## 2020-09-30 MED ORDER — INSULIN DETEMIR 100 UNIT/ML ~~LOC~~ SOLN
4.0000 [IU] | Freq: Once | SUBCUTANEOUS | Status: AC
  Administered 2020-09-30: 4 [IU] via SUBCUTANEOUS
  Filled 2020-09-30: qty 0.04

## 2020-09-30 NOTE — Evaluation (Signed)
Clinical/Bedside Swallow Evaluation Patient Details  Name: Kathleen [name] MRN: [id number] Date of Birth: Aug 01, 1950  Today's Date: 09/30/2020 Time: SLP Start Time (ACUTE ONLY): 45 SLP Stop Time (ACUTE ONLY): 3532 SLP Time Calculation (min) (ACUTE ONLY): 25 min  Past Medical History:  Past Medical History:  Diagnosis Date   Carotid artery plaque    duplex 07/2020 1-39% BICA   CKD (chronic kidney disease), stage IV (HCC)    Complication of anesthesia    pt states stopped breathing when having tonsillectomy at age 77; pt states has had anesthesia since then without difficulty    Diabetes mellitus    Glaucoma    Heart disease    Hyperlipidemia    Hypertension    Hyperthyroidism    Menopause    Mixed hyperlipidemia 09/22/2013   Morbid obesity ([place])    Multinodular goiter    AND HYPERTHYROIDISM    Obesity (BMI 30-39.9) 04/30/2015   OSA on CPAP    Proliferative retinopathy    moderate, Dr. Posey Pronto   Proteinuria    Dr. Hassell Done   Pulmonary hypertension ([place])    PVC's (premature ventricular contractions)    Right-sided heart failure ([place])    Sinus bradycardia    Trigger finger    Dr. Rip Harbour   Past Surgical History:  Past Surgical History:  Procedure Laterality Date   GASTRIC BANDING PORT REVISION N/A 02/27/2015   Procedure: GASTRIC BANDING PORT REVISION;  Surgeon: Johnathan Hausen, MD;  Location: [facility];  Service: General;  Laterality: N/A;   IR CT HEAD LTD  09/27/2020   IR PERCUTANEOUS ART THROMBECTOMY/INFUSION INTRACRANIAL INC DIAG ANGIO  09/27/2020   IR US GUIDE VASC ACCESS RIGHT  09/27/2020   LAPAROSCOPIC GASTRIC BANDING  2010   NERVE SURGERY  2001   RADIOLOGY WITH ANESTHESIA N/A 09/27/2020   Procedure: IR WITH ANESTHESIA;  Surgeon: Radiologist, Medication, MD;  Location: [place] [facility];  Service: Radiology;  Laterality: N/A;   SPLIT NIGHT STUDY  08/03/2015   TONSILLECTOMY  1958   HPI:  Pt is a 70 y.o. F who presents with right hemiplegia, facial droop and left gaze. Found to have distal  left M1 occlusion s/p thrombectomy. Pt with new onset paroxysmal afib and acute respiratory insufficiencies with concern for ability to protect airway. Significant PMH: CKD stage 4, DM, CHF, morbid obesity, OSA on CPAP.   Assessment / Plan / Recommendation Clinical Impression  Patient presents with a severe to profound oropharyngeal dysphagia secondary to suspected oral motor apraxia and aphasia. Patient was able to follow verbal commands to open mouth, close mouth as well as attempt to cough and attempt to swallow. She was only able to achieve very minimal amount of movement/initiation of cough or swallow and she continues to not demonstrate any lingual movement. Patient did exhibit one brief instance of vocalizing when smiling during cued exaggerated yawn. Patient is visually tracking left to right now, following one step commands promptly, but unable to perform actions such as localizing finger to point or give thumbs up and unable to point to body parts (ear, nose, etc). SLP is recommending to continue maintaining moist oral mucosa and providing frequent oral care. SLP will continue to follow patient for PO readiness. SLP Visit Diagnosis: Dysphagia, unspecified (R13.10)    Aspiration Risk  Severe aspiration risk;Risk for inadequate nutrition/hydration    Diet Recommendation NPO   Medication Administration: Via alternative means    Other  Recommendations Oral Care Recommendations: Oral care QID;Staff/trained caregiver to provide oral care  Follow up Recommendations Inpatient Rehab;24 hour supervision/assistance      Frequency and Duration min 3x week  1 week       Prognosis Prognosis for Safe Diet Advancement: Good      Swallow Study   General Date of Onset: 09/27/20 HPI: Pt is a 70 y.o. F who presents with right hemiplegia, facial droop and left gaze. Found to have distal left M1 occlusion s/p thrombectomy. Pt with new onset paroxysmal afib and acute respiratory insufficiencies  with concern for ability to protect airway. Significant PMH: CKD stage 4, DM, CHF, morbid obesity, OSA on CPAP. Type of Study: Bedside Swallow Evaluation Previous Swallow Assessment: None found Diet Prior to this Study: NPO Temperature Spikes Noted: No Respiratory Status: Nasal cannula History of Recent Intubation: No Behavior/Cognition: Alert;Cooperative;Pleasant mood;Requires cueing Oral Cavity Assessment: Dried secretions Oral Care Completed by SLP: Yes Oral Cavity - Dentition: Adequate natural dentition Self-Feeding Abilities: Total assist Patient Positioning: Upright in bed Baseline Vocal Quality: Other (comment) (patient unable to vocalize) Volitional Cough: Other (Comment) (patient attempted cough when cued but unable to perform adequately) Volitional Swallow: Unable to elicit (patient attempted to initiate swallow when cued but unable to perform adequately)    Oral/Motor/Sensory Function Overall Oral Motor/Sensory Function: Severe impairment (difficult to fully assess secondary to suspected oral motor apraxia. Patient unable to demonstrate lingual movements but is able to open mouth, close mouth)   Ice Chips Ice chips: Impaired Oral Phase Impairments: Reduced labial seal;Reduced lingual movement/coordination Oral Phase Functional Implications: Oral holding;Oral residue Other Comments: Patient did attempt to move ice chip (very small) using lower lip but unable to move tongue and ice chip remained in anterior portion of mouth   Thin Liquid Thin Liquid: Not tested    Nectar Thick Nectar Thick Liquid: Not tested   Honey Thick Honey Thick Liquid: Not tested   Puree Puree: Not tested   Solid     Solid: Not tested     Sonia Baller, MA, CCC-SLP Speech Therapy

## 2020-09-30 NOTE — Progress Notes (Addendum)
RT at bedside, pt continues to be confused and not able to answer questions.  RN has been having to NTS pt due to pt unable to cough up secretions. Dr. Shearon Stalls critical care evaluated pt during day shift and still stated in his NOTE that pt needed to be place on bipap at night time. Pt placed on Bipap on documented settings. RN made aware.

## 2020-09-30 NOTE — Progress Notes (Signed)
Patient ID: Kathleen [name], female   DOB: 05-21-1950, 70 y.o.   MRN: [id number] Patient ID: Kathleen [name], female   DOB: [birth year], 70 y.o.   MRN: [id number] STROKE TEAM PROGRESS NOTE   INTERVAL HISTORY  Agreed with biPAP overnight and feels much better today. More energy and more alert per nurse. Cortrack placed. She has no complaints.  No headache, no SOB. Vitals:   09/30/20 1100 09/30/20 1200 09/30/20 1300 09/30/20 1400  BP: (!) 148/73 (!) 156/73 (!) 157/62 113/87  Pulse: 77 78 80 75  Resp: 17 (!) 0 20 (!) 22  Temp:  98.9 F (37.2 C)    TempSrc:  Oral    SpO2: 93% 96% 95% 96%  Weight:      Height:       CBC:  Recent Labs  Lab 09/27/20 1356 09/27/20 1401 09/29/20 0434 09/30/20 0133  WBC 18.2*   < > 12.1* 9.9  NEUTROABS 15.9*  --   --   --   HGB 12.2   < > 9.9* 10.5*  HCT 38.9   < > 31.1* 31.6*  MCV 93.3   < > 91.2 89.0  PLT 343   < > 289 262   < > = values in this interval not displayed.   Basic Metabolic Panel:  Recent Labs  Lab 09/29/20 0434 09/29/20 1639 09/30/20 0133  NA 138  --  140  K 4.4  --  4.6  CL 101  --  104  CO2 22  --  24  GLUCOSE 280*  --  229*  BUN 51*  --  66*  CREATININE 3.32*  --  3.67*  CALCIUM 8.1*  --  7.9*  MG 2.0 2.0 2.1  PHOS 5.6* 4.3  --    Lipid Panel:  Recent Labs  Lab 09/30/20 0133  CHOL 140  TRIG 63  HDL 50  CHOLHDL 2.8  VLDL 13  LDLCALC 77   HgbA1c:  Recent Labs  Lab 09/28/20 0245  HGBA1C 7.8*   Urine Drug Screen:  Recent Labs  Lab 09/28/20 0951  LABOPIA NONE DETECTED  COCAINSCRNUR NONE DETECTED  LABBENZ NONE DETECTED  AMPHETMU NONE DETECTED  THCU NONE DETECTED  LABBARB NONE DETECTED    Alcohol Level  Recent Labs  Lab 09/27/20 1356  ETH <10    IMAGING past 24 hours No results found.  PHYSICAL EXAM General: Appears well-developed; on [place]. No acute distress. Appears comfortable. HENT: [place] in place. Head: Normocephalic.  Skin: no obvious lesions.    Neurological Examination Mental  Status: More alert today, sitting up in bed. Nodded yes/no to questions. She followed all commands.Non verbal. Did not name objects.  Cranial Nerves: Left gaze pref, but will look to right and cross midline. Right facial droop.  Motor:Tone and bulk:normal tone throughout; moving left side wnl. Right side weakness 4/5. Deep Tendon Reflexes: 2+ and symmetric throughout Plantars: Right: downgoing   Left: downgoing Cerebellar: no gross ataxia. Gait: unable due to aphasia  ASSESSMENT/PLAN [name] is a 70 y.o. female acute right hemiplegia, right facial droop, leftward gaze and global aphasia. NIHSS 27 on admission. CTA showed distal Left M1 occlusion with favorable perfusion missmatich. She was outside of time window for IV tPA, but was emergently treated with thrombectomy with TICI 3 restoration of flow.   Stroke:  left MCA infarct; cardioembolic secondary to Afib (new dx, not on [place])  Code Stroke CT head No acute abnormality. Small vessel disease. Atrophy. ASPECTS 7.  CTA head & neck distal left M1 or proximal M2 MCA in the anterior aspect of the left sylvian fissure (series 10, images 85 through 88) which correlates with the hyperdensity seen on same day noncontrast head CT and is concerning for a high-grade stenosis or occlusion. The remainder of the CTA is essentially nondiagnostic in the head and neck due to non arterial timing. CT perfusion 47ml core in anterior LMCA. 67ml detected penumbra MRI  completed. Acute left MCA ischemic CVA. Petechial hemorrhage. No shift.  MRA revascularization of proximal MCA noted. 2D Echo completed. EF 60-65%. Left atrial dilatation. No shunt/no thrombus. LDL 91 HgbA1c 7.8 VTE prophylaxis - scds    Diet   Diet NPO time specified   none  prior to admission, now on aspirin 325mg  Via cortrak  Therapy recommendations:  pending Disposition:  pending  Hypertension Home meds:  Aldactone, norvasc, coreg, chlorthalidone Stable Permissive  hypertension (OK if < 220/120) but gradually normalize in 5-7 days Long-term BP goal normotensive  Hyperlipidemia Home meds:  lipitor 80mg , resumed in hospital LDL 91, goal < 70 Add   High intensity statin  Continue statin at discharge  Diabetes type II Uncontrolled Home meds:  Farxiga, levemir flexpen, novolog HgbA1c 7.8, goal < 7.0 CBGs Recent Labs    09/30/20 0730 09/30/20 1105 09/30/20 1526  GLUCAP 234* 260* 279*    SSI Sugars still elevated. Increase levemir insulin to 12 units BID, managed by CCM  Pulmonary: tolerated BiPaP  Other Stroke Risk Factors Advanced Age >/= 33  Former Cigarette smoker; quit 10yrs ago Obesity, Body mass index is 32.45 kg/m., BMI >/= 30 associated with increased stroke risk, recommend weight loss, diet and exercise as appropriate  CAD Carotid artery disease Obstructive sleep apnea, on CPAP at home- RT consulted for to add this for daytime or nighttime sleeping.  Right side heart failure- stopped IVF and ordred Lasix 20mg . Foley catheter for strict I&Os.  New dx of Afib- 12lead ordered to confirm-not done yet.   Other Active Problems Morbid obesity- s/p gastric banding in 2010 with revision in 2020 Hyperthyroidism- on methimazole at home Dysphasia- d/t stroke-  Cortrak for nutrition and meds  Hospital day # 3  Egbert Garibaldi, MD  This patient is critically ill due to left MCA stroke, status post thrombectomy, new diagnosed A. fib, respiratory distress and at significant risk of neurological worsening, death form respiratory failure, recurrent stroke, hemorrhagic conversion, seizure, heart failure. This patient's care requires constant monitoring of vital signs, hemodynamics, respiratory and cardiac monitoring, review of multiple databases, neurological assessment, discussion with family, other specialists and medical decision making of high complexity. I spent 30 minutes of neurocritical care time in the care of this patient.

## 2020-09-30 NOTE — Progress Notes (Signed)
NAME:  Kathleen [name], MRN:  [id number], DOB:  [birth year], LOS: 3 ADMISSION DATE:  09/27/2020, CONSULTATION DATE:  09/25/2020 REFERRING MD:  Dr. Erlinda Hong, CHIEF COMPLAINT:  Acute stroke   History of Present Illness:  Kathleen [name] is a 70 y.o. female with a PMH significant for CKD stage 4, DM, chronic diastolic congestive heart failure, hypertension, hyperlipidemia, OSA on CPAP hypertension, and morbid obesity who presented to the emergency department as a code stroke. She has acute right hemiplegia with right facial droop and leftward gaze. She was admitted under stroke and sent to IR for thrombectomy. Dr. Earleen Newport was able to preform successful revascularization of left M1 occlusion. Patient was extubated post procedure and able to follow commands this am  By afternoon of 7/29 patient was seen with decreased mentation and snoring respirations with concern for ability to protect her airway. PCCM was consulted for further airway management.   Pertinent  Medical History  CKD stage 4, DM, chronic diastolic congestive heart failure, hypertension, hyperlipidemia, OSA on CPAP hypertension, and morbid obesity  Significant Hospital Events:  7/29 admitted for code stroke found to have a M! Occlusion, underwent thrombectomy by IR 7/29 PCCM consulted for worsening respiratory failure  Interim History / Subjective:  Wore bipap overnight This morning is awake, alert, following commands but weak.  Weaned to nasal cannula from venti mask.   Objective   Blood pressure (!) 155/78, pulse 82, temperature 99 F (37.2 C), temperature source Oral, resp. rate 20, height 5\' 3"  (1.6 m), weight 83.1 kg, SpO2 96 %.    FiO2 (%):  [35 %] 35 %   Intake/Output Summary (Last 24 hours) at 09/30/2020 1122 Last data filed at 09/30/2020 1000 Gross per 24 hour  Intake 1495 ml  Output 440 ml  Net 1055 ml   Filed Weights   09/27/20 1408 09/29/20 0344 09/30/20 0400  Weight: 96 kg 96.7 kg 83.1 kg    Examination: General:  chronically ill appearing, no distress, sitting up in bed  HEENT:  mmm, nasal cannula, large neck Neuro: awake,  right sided hemiparesis, follows commands and tries to answer questions. Weak.   CV: s1s2 regular rate and rhythm, no murmur, rubs, or gallops,  PULM:  breathing non labored on nasal cannula no wheezes GI: obese, soft, nontender Extremities: warm/dry, generalized edema  Skin: no rashes or lesions  Labs/imaging personally reviewed Cr 3.67, uop increasing Na 140 K 4.6 WBC 9.9 Hgb 10.5  Resolved Hospital Problem list     Assessment & Plan:  Acute hypoxemic respiratory failure  Hx of OSA on CPAP P: Much improved with wearing CPAP overnight. Suspect combination of dependent atelectasis with untreated OSA and hypoventilation from decreased mental status. I think she needs to get moving with PT and that will recruit up more lung. Discussed OOB with nurse.  Continue venti mask and titrate down oxygen saturations goal sats over 92%.  Head of bed elevated 30 degrees Ensure adequate pulmonary hygiene  Avoid sedation Can utilize NTS if needed   Oliguric AKI on CKD stage 3 - baseline Cr around 2 - appears to have some post-ATN diuresis, oup picking back up - monitor repeat BMP  Acute left MCA stroke  -Felt to cardioembolic with new onset A-fib  P: Management per neurology  Maintain neuro protective measures; goal for eurothermia, euglycemia, eunatermia, normoxia, and PCO2 goal of 35-40 Nutrition and bowel regiment  Seizure precautions  Aspirations precautions  SLP evaluation, PT/OT  Hx of diastolic CHF -EF 67-34% with  grade II diastolic dysfunction  31/5176 Hx of HTN/HLD -Norvasc, Lipitor, Coreg, Aldactone,  Paroxysmal A-fib, new onset  P: Strict intake and output  Daily weight  Continuous telemetry  Hold home medications, will add back as needed  HX of type II diabetes  -Home medications include Farrxiga  P: SSI CBG q4 Increase long acting insulin today for  persistent hyperglycemia  Goals of care  PCCM discussed 7/29 with patient's sister Horris Latino. Patient valued quality of life - would like to be DNR but would consider short course of intubation for reversible causes if good quality of life a possibility.   Best Practice (right click and "Reselect all SmartList Selections" daily)   Diet/type: tubefeeds DVT prophylaxis: SCD GI prophylaxis: PPI Lines: N/A Foley:  N/A Code Status:  DNR Last date of multidisciplinary goals of care discussion: See above  Rest per stroke team.   Lenice Llamas, MD Pulmonary and [place]   Labs   CBC: Recent Labs  Lab 09/27/20 1356 09/27/20 1401 09/28/20 0245 09/28/20 1108 09/29/20 0434 09/30/20 0133  WBC 18.2*  --  14.3*  --  12.1* 9.9  NEUTROABS 15.9*  --   --   --   --   --   HGB 12.2 13.6 10.6* 15.6* 9.9* 10.5*  HCT 38.9 40.0 32.0* 46.0 31.1* 31.6*  MCV 93.3  --  88.2  --  91.2 89.0  PLT 343  --  325  --  289 160    Basic Metabolic Panel: Recent Labs  Lab 09/27/20 1356 09/27/20 1401 09/28/20 0245 09/28/20 1108 09/28/20 1352 09/28/20 1745 09/29/20 0434 09/29/20 1639 09/30/20 0133  NA 137 135 139 138  --   --  138  --  140  K 4.7 4.5 3.6 3.9  --   --  4.4  --  4.6  CL 97* 101 100  --   --   --  101  --  104  CO2 19*  --  25  --   --   --  22  --  24  GLUCOSE 389* 384* 113*  --   --   --  280*  --  229*  BUN 35* 33* 36*  --   --   --  51*  --  66*  CREATININE 2.78* 2.60* 2.63*  --   --   --  3.32*  --  3.67*  CALCIUM 9.1  --  8.6*  --   --   --  8.1*  --  7.9*  MG  --   --   --   --  2.0 1.9 2.0 2.0 2.1  PHOS  --   --   --   --  5.4* 5.6* 5.6* 4.3  --    GFR: Estimated Creatinine Clearance: 14.6 mL/min (A) (by C-G formula based on SCr of 3.67 mg/dL (H)). Recent Labs  Lab 09/27/20 1356 09/28/20 0245 09/29/20 0434 09/30/20 0133  WBC 18.2* 14.3* 12.1* 9.9    Liver Function Tests: Recent Labs  Lab 09/27/20 1356  AST 38  ALT 13  ALKPHOS 69   BILITOT 2.9*  PROT 6.2*  ALBUMIN 3.2*   No results for input(s): LIPASE, AMYLASE in the last 168 hours. No results for input(s): AMMONIA in the last 168 hours.  ABG    Component Value Date/Time   PHART 7.432 09/28/2020 1108   PCO2ART 36.5 09/28/2020 1108   PO2ART 89 09/28/2020 1108   HCO3 24.3 09/28/2020 1108   TCO2  25 09/28/2020 1108   O2SAT 97.0 09/28/2020 1108     Coagulation Profile: Recent Labs  Lab 09/27/20 1356  INR 1.1    Cardiac Enzymes: No results for input(s): CKTOTAL, CKMB, CKMBINDEX, TROPONINI in the last 168 hours.  HbA1C: Hgb A1c MFr Bld  Date/Time Value Ref Range Status  09/28/2020 02:45 AM 7.8 (H) 4.8 - 5.6 % Final    Comment:    (NOTE) Pre diabetes:          5.7%-6.4%  Diabetes:              >6.4%  Glycemic control for   <7.0% adults with diabetes   05/24/2016 12:09 AM 7.0 (H) 4.8 - 5.6 % Final    Comment:    (NOTE)         Pre-diabetes: 5.7 - 6.4         Diabetes: >6.4         Glycemic control for adults with diabetes: <7.0     CBG: Recent Labs  Lab 09/29/20 1915 09/29/20 2312 09/30/20 0302 09/30/20 0730 09/30/20 1105  GLUCAP 213* 199* 225* 234* 260*

## 2020-09-30 NOTE — Progress Notes (Signed)
Pt placed on Bipap for the night, pt tolerating well.

## 2020-09-30 NOTE — Plan of Care (Signed)
  Problem: Activity: Goal: Risk for activity intolerance will decrease Outcome: Progressing    Patient transferred from bed to chair via hoyer lift with 2 nurse assist and NT without complication. Patient sitting up in recliner, alert, watching tv. Call bell within reach. 4L O2 [place] in place.

## 2020-10-01 DIAGNOSIS — I63412 Cerebral infarction due to embolism of left middle cerebral artery: Secondary | ICD-10-CM | POA: Diagnosis not present

## 2020-10-01 DIAGNOSIS — N179 Acute kidney failure, unspecified: Secondary | ICD-10-CM | POA: Diagnosis not present

## 2020-10-01 LAB — CBC
HCT: 33.5 % — ABNORMAL LOW (ref 36.0–46.0)
Hemoglobin: 10.6 g/dL — ABNORMAL LOW (ref 12.0–15.0)
MCH: 28.4 pg (ref 26.0–34.0)
MCHC: 31.6 g/dL (ref 30.0–36.0)
MCV: 89.8 fL (ref 80.0–100.0)
Platelets: 298 10*3/uL (ref 150–400)
RBC: 3.73 MIL/uL — ABNORMAL LOW (ref 3.87–5.11)
RDW: 15.3 % (ref 11.5–15.5)
WBC: 9.8 10*3/uL (ref 4.0–10.5)
nRBC: 0 % (ref 0.0–0.2)

## 2020-10-01 LAB — BASIC METABOLIC PANEL
Anion gap: 10 (ref 5–15)
BUN: 83 mg/dL — ABNORMAL HIGH (ref 8–23)
CO2: 26 mmol/L (ref 22–32)
Calcium: 8 mg/dL — ABNORMAL LOW (ref 8.9–10.3)
Chloride: 106 mmol/L (ref 98–111)
Creatinine, Ser: 3.98 mg/dL — ABNORMAL HIGH (ref 0.44–1.00)
GFR, Estimated: 12 mL/min — ABNORMAL LOW (ref >60)
Glucose, Bld: 103 mg/dL — ABNORMAL HIGH (ref 70–99)
Potassium: 4.5 mmol/L (ref 3.5–5.1)
Sodium: 142 mmol/L (ref 135–145)

## 2020-10-01 LAB — GLUCOSE, CAPILLARY
Glucose-Capillary: 71 mg/dL (ref 70–99)
Glucose-Capillary: 143 mg/dL — ABNORMAL HIGH (ref 70–99)
Glucose-Capillary: 204 mg/dL — ABNORMAL HIGH (ref 70–99)
Glucose-Capillary: 230 mg/dL — ABNORMAL HIGH (ref 70–99)
Glucose-Capillary: 231 mg/dL — ABNORMAL HIGH (ref 70–99)
Glucose-Capillary: 220 mg/dL — ABNORMAL HIGH (ref 70–99)

## 2020-10-01 MED ORDER — LACTATED RINGERS IV SOLN: INTRAVENOUS | Status: DC

## 2020-10-01 MED ORDER — INSULIN DETEMIR 100 UNIT/ML ~~LOC~~ SOLN
10.0000 [IU] | Freq: Two times a day (BID) | SUBCUTANEOUS | Status: DC
  Administered 2020-10-01 (×2): 10 [IU] via SUBCUTANEOUS
  Filled 2020-10-01 (×4): qty 0.1

## 2020-10-01 NOTE — Progress Notes (Addendum)
Physical Therapy Treatment Patient Details Name: Kathleen [name] MRN: [id number] DOB: 1950/12/07 Today's Date: 10/01/2020    History of Present Illness Pt is a 70 y.o. F who presents with right hemiplegia, facial droop and left gaze. Found to have distal left M1 occlusion s/p thrombectomy. Pt with new onset paroxysmal afib and acute respiratory insufficiencies with concern for ability to protect airway. Significant PMH: CKD stage 4, DM, CHF, morbid obesity, OSA on CPAP.    PT Comments    Pt making excellent progress towards her physical therapy goals. Still nonverbal, however, consistently nods/yes accurately and following one step commands. Also noted improvement in right sided strength. Pt able to participate in bed level exercises for ROM/strengthening of right upper and lower extremity. Pt requiring min assist for bed mobility; two person moderate assist for stand pivot to recliner. Discussed with pt sister who states pt independent PTA. Pt would benefit from intensive rehabilitation to address deficits and maximize functional mobility.    Follow Up Recommendations  CIR     Equipment Recommendations  3in1 (PT);Wheelchair (measurements PT);Wheelchair cushion (measurements PT)    Recommendations for Other Services Rehab consult     Precautions / Restrictions Precautions Precautions: Fall;Other (comment) Precaution Comments: R hemiplegia, NGT Restrictions Weight Bearing Restrictions: No    Mobility  Bed Mobility Overal bed mobility: Needs Assistance Bed Mobility: Supine to Sit     Supine to sit: Min assist     General bed mobility comments: Cues for initiation, use of bed pad to scoot hips anteriorly, light assist to execute sitting up    Transfers Overall transfer level: Needs assistance Equipment used: None Transfers: Sit to/from [facility] Sit to Stand: Mod assist;+2 physical assistance Stand pivot transfers: Mod assist;+2 physical assistance        General transfer comment: ModA + 2 to stand from edge of bed and take pivotal steps over to recliner. No right knee buckle noted  Ambulation/Gait                 Stairs             Wheelchair Mobility    Modified Rankin (Stroke Patients Only) Modified Rankin (Stroke Patients Only) Pre-Morbid Rankin Score: No symptoms Modified Rankin: Severe disability     Balance Overall balance assessment: Needs assistance Sitting-balance support: Feet supported Sitting balance-Leahy Scale: Fair     Standing balance support: No upper extremity supported;During functional activity Standing balance-Leahy Scale: Poor                              Cognition Arousal/Alertness: Awake/alert Behavior During Therapy: Flat affect Overall Cognitive Status: Difficult to assess Area of Impairment: Following commands                       Following Commands: Follows one step commands with increased time       General Comments: Pt nonverbal, consistently nods yes/no. Oriented x 4.      Exercises General Exercises - Upper Extremity Shoulder Flexion: AAROM;Right;10 reps;Supine Elbow Flexion: AAROM;Right;10 reps;Supine Digit Composite Flexion: AROM;Right;10 reps;Supine Composite Extension: AROM;Right;10 reps;Supine General Exercises - Lower Extremity Ankle Circles/Pumps: Both;20 reps;Supine Heel Slides: Right;10 reps;Supine    General Comments General comments (skin integrity, edema, etc.): VSS on 4L       Pertinent Vitals/Pain Pain Assessment: Faces Faces Pain Scale: No hurt    Home Living  Prior Function            PT Goals (current goals can now be found in the care plan section) Acute Rehab PT Goals Patient Stated Goal: unable PT Goal Formulation: Patient unable to participate in goal setting Time For Goal Achievement: 10/13/20 Potential to Achieve Goals: Fair Progress towards PT goals: Progressing toward  goals    Frequency    Min 4X/week      PT Plan Current plan remains appropriate    Co-evaluation              AM-PAC PT "6 Clicks" Mobility   Outcome Measure  Help needed turning from your back to your side while in a flat bed without using bedrails?: A Little Help needed moving from lying on your back to sitting on the side of a flat bed without using bedrails?: A Little Help needed moving to and from a bed to a chair (including a wheelchair)?: A Lot Help needed standing up from a chair using your arms (e.g., wheelchair or bedside chair)?: A Lot Help needed to walk in hospital room?: Total Help needed climbing 3-5 steps with a railing? : Total 6 Click Score: 12    End of Session Equipment Utilized During Treatment: Oxygen Activity Tolerance: Patient tolerated treatment well Patient left: in bed;with call bell/phone within reach;with bed alarm set Nurse Communication: Mobility status PT Visit Diagnosis: Other abnormalities of gait and mobility (R26.89);Hemiplegia and hemiparesis Hemiplegia - Right/Left: Right Hemiplegia - caused by: Cerebral infarction     Time: 1125-1150 PT Time Calculation (min) (ACUTE ONLY): 25 min  Charges:  $Therapeutic Exercise: 8-22 mins $Therapeutic Activity: 8-22 mins                     Wyona Almas, PT, DPT Acute Rehabilitation Services Pager [phone] Office [place] 10/01/2020, 1:26 PM

## 2020-10-01 NOTE — Progress Notes (Signed)
  Speech Language Pathology Treatment: Dysphagia;Cognitive-Linquistic  Patient Details Name: Kathleen [name] MRN: [id number] DOB: 1950/04/30 Today's Date: 10/01/2020 Time: 1200-1230 SLP Time Calculation (min) (ACUTE ONLY): 30 min  Assessment / Plan / Recommendation Clinical Impression  Pt demonstrates ability to initiate oral manipulation and swallow response with ice chips x2, teaspoon of nectar x2 and teaspoon of puree x2. Pooling in anterior sulcus noted. Pt with delayed cough after all trials. Pt demonstrated promising ability to swallow modified textures, will f/u with MBS tomorrow. RN may give an occasional ice chip.  Pt able to follow 2/10 commands. Cannot identify objects, but demonstrates good attention and cognition to task (if told she is wrong, picks the other object). Pt unable to initiate articulation or phonation with singing, or MIT techniques. Provided education to sister about aphasia and visual cueing. Recommend CIR.   HPI HPI: Pt is a 70 y.o. F who presents with right hemiplegia, facial droop and left gaze. Found to have distal left M1 occlusion s/p thrombectomy. Pt with new onset paroxysmal afib and acute respiratory insufficiencies with concern for ability to protect airway. Significant PMH: CKD stage 4, DM, CHF, morbid obesity, OSA on CPAP.      SLP Plan  MBS       Recommendations  Diet recommendations: Other(comment) (ice)                General recommendations: Rehab consult Follow up Recommendations: Inpatient Rehab;24 hour supervision/assistance SLP Visit Diagnosis: Dysphagia, unspecified (R13.10) Plan: MBS       GO                Kathleen [name], Kathleen [name] 10/01/2020, 1:05 PM

## 2020-10-01 NOTE — Progress Notes (Signed)
Patient ID: SEQUOIA WITZ, female   DOB: [birth year], 70 y.o.   MRN: [id number] Patient ID: MELVIN MARMO, female   DOB: 1950-05-13, 70 y.o.   MRN: [id number] STROKE TEAM PROGRESS NOTE   INTERVAL HISTORY Her sister is at the bedside.  Patient sitting up in bed.  She is breathing all right.  She speech therapist is at the bedside.  She remains on tube feeds.  She was on BiPAP at night but is now on nasal cannula.  Plans are to transfer to the floor.  Speech therapy plans modified barium swallow.   Vitals:   10/01/20 0900 10/01/20 1000 10/01/20 1100 10/01/20 1200  BP: (!) 166/81 (!) 167/113 130/75 139/79  Pulse: 77 80 70 77  Resp: (!) 23 19 16 16   Temp:    98.7 F (37.1 C)  TempSrc:    Axillary  SpO2: 98% 98% 99% 98%  Weight:      Height:       CBC:  Recent Labs  Lab 09/27/20 1356 09/27/20 1401 09/30/20 0133 10/01/20 0146  WBC 18.2*   < > 9.9 9.8  NEUTROABS 15.9*  --   --   --   HGB 12.2   < > 10.5* 10.6*  HCT 38.9   < > 31.6* 33.5*  MCV 93.3   < > 89.0 89.8  PLT 343   < > 262 298   < > = values in this interval not displayed.   Basic Metabolic Panel:  Recent Labs  Lab 09/29/20 0434 09/29/20 1639 09/30/20 0133 10/01/20 0146  NA 138  --  140 142  K 4.4  --  4.6 4.5  CL 101  --  104 106  CO2 22  --  24 26  GLUCOSE 280*  --  229* 103*  BUN 51*  --  66* 83*  CREATININE 3.32*  --  3.67* 3.98*  CALCIUM 8.1*  --  7.9* 8.0*  MG 2.0 2.0 2.1  --   PHOS 5.6* 4.3  --   --    Lipid Panel:  Recent Labs  Lab 09/30/20 0133  CHOL 140  TRIG 63  HDL 50  CHOLHDL 2.8  VLDL 13  LDLCALC 77   HgbA1c:  Recent Labs  Lab 09/28/20 0245  HGBA1C 7.8*   Urine Drug Screen:  Recent Labs  Lab 09/28/20 0951  LABOPIA NONE DETECTED  COCAINSCRNUR NONE DETECTED  LABBENZ NONE DETECTED  AMPHETMU NONE DETECTED  THCU NONE DETECTED  LABBARB NONE DETECTED    Alcohol Level  Recent Labs  Lab 09/27/20 1356  ETH <10    IMAGING past 24 hours No results found.  PHYSICAL  EXAM General: Obese elderly Caucasian lady.  On [place]. No acute distress. Appears comfortable. HENT: [place] in place. Head: Normocephalic.  Skin: no obvious lesions.    Neurological Examination Mental Status: More alert today, sitting up in bed.  Expressive greater than receptive aphasia nodded yes/no to questions. She followed all commands.Non verbal. Did not name objects.  Cranial Nerves: Left gaze pref, but will look to right and cross midline. Right facial droop.  Motor:Tone and bulk:normal tone throughout; moving left side well l. Right side weakness 4/5. Deep Tendon Reflexes: 2+ and symmetric throughout Plantars: Right: downgoing   Left: downgoing Cerebellar: no gross ataxia. Gait: unable due to aphasia  ASSESSMENT/PLAN Ms. NORI WINEGAR is a 70 y.o. female acute right hemiplegia, right facial droop, leftward gaze and global aphasia. NIHSS 27 on admission. CTA showed distal Left  M1 occlusion with favorable perfusion missmatich. She was outside of time window for IV tPA, but was emergently treated with thrombectomy 09/28/20 with TICI 3 restoration of flow Dr Earleen Newport.  Stroke:  left MCA infarct; cardioembolic secondary to Afib (new dx, not on [place])  Code Stroke CT head No acute abnormality. Small vessel disease. Atrophy. ASPECTS 7.   CTA head & neck distal left M1 or proximal M2 MCA in the anterior aspect of the left sylvian fissure (series 10, images 85 through 88) which correlates with the hyperdensity seen on same day noncontrast head CT and is concerning for a high-grade stenosis or occlusion. The remainder of the CTA is essentially nondiagnostic in the head and neck due to non arterial timing. CT perfusion 101ml core in anterior LMCA. 49ml detected penumbra MRI  completed. Acute left MCA ischemic CVA. Petechial hemorrhage. No shift.  MRA revascularization of proximal MCA noted. 2D Echo completed. EF 60-65%. Left atrial dilatation. No shunt/no thrombus. LDL 91 HgbA1c 7.8 VTE  prophylaxis - scds    Diet   Diet NPO time specified   none  prior to admission, now on aspirin 325mg  Via cortrak  Therapy recommendations:  CIR recommended Disposition:  pending  Hypertension Home meds:  Aldactone, norvasc, coreg, chlorthalidone Stable Permissive hypertension (OK if < 220/120) but gradually normalize in 5-7 days Long-term BP goal normotensive  Hyperlipidemia Home meds:  lipitor 80mg , resumed in hospital LDL 91, goal < 70 Add   High intensity statin  Continue statin at discharge  Diabetes type II Uncontrolled Home meds:  Farxiga, levemir flexpen, novolog HgbA1c 7.8, goal < 7.0 CBGs Recent Labs    10/01/20 0320 10/01/20 0732 10/01/20 1108  GLUCAP 71 143* 204*    SSI Sugars still elevated. Increase levemir insulin to 12 units BID, managed by CCM  Pulmonary: tolerated BiPaP  Other Stroke Risk Factors Advanced Age >/= 91  Former Cigarette smoker; quit 63yrs ago Obesity, Body mass index is 32.57 kg/m., BMI >/= 30 associated with increased stroke risk, recommend weight loss, diet and exercise as appropriate  CAD Carotid artery disease Obstructive sleep apnea, on CPAP at home- RT consulted for to add this for daytime or nighttime sleeping.  Right side heart failure- stopped IVF and ordred Lasix 20mg . Foley catheter for strict I&Os.  New dx of Afib- 12lead ordered to confirm-not done yet.   Other Active Problems Morbid obesity- s/p gastric banding in 2010 with revision in 2020 Hyperthyroidism- on methimazole at home Dysphasia- d/t stroke-  Cortrak for nutrition and meds Atrial fibrillation - newly diagnosed - ASA 325 mg daily - anticoagulate when appropriate from stroke standpoint. CKD stage IV - creatinine - 3.98 Anemia - Hgb - 12.2->10.5->10.6 Code status - DNR NPO [facility] day # 4 Plan continue mobilize out of bed.  Therapy consults.  Speech therapy to do swallow eval today.  We will transfer to neurology floor bed available.   Continue BiPAP at night.  Patient is DNR and does not want intubation.  Discussed with patient and sister at the bedside and answered questions.  Discussed with Dr. Tacy Learn critical care medicine  This patient is critically ill due to left MCA stroke, status post thrombectomy, new diagnosed A. fib, respiratory distress and at significant risk of neurological worsening, death form respiratory failure, recurrent stroke, hemorrhagic conversion, seizure, heart failure. This patient's care requires constant monitoring of vital signs, hemodynamics, respiratory and cardiac monitoring, review of multiple databases, neurological assessment, discussion with family, other specialists and  medical decision making of high complexity. I spent 32 minutes of neurocritical care time in the care of this patient.  Antony Contras, MD

## 2020-10-01 NOTE — Progress Notes (Addendum)
NAME:  Kathleen [name], MRN:  [id number], DOB:  12-26-1950, LOS: 4 ADMISSION DATE:  09/27/2020, CONSULTATION DATE:  09/25/2020 REFERRING MD:  Dr. Erlinda Hong, CHIEF COMPLAINT:  Acute stroke   History of Present Illness:  Kathleen [name] is a 70 y.o. female with a PMH significant for CKD stage 4, DM, chronic diastolic congestive heart failure, hypertension, hyperlipidemia, OSA on CPAP hypertension, and morbid obesity who presented to the emergency department as a code stroke. She has acute right hemiplegia with right facial droop and leftward gaze. She was admitted under stroke status post thrombectomy of left M1 occlusion. Patient was extubated post procedure and able to follow commands.  By afternoon of 7/29 patient was seen with decreased mentation and snoring respirations with concern for ability to protect her airway. PCCM was consulted for further airway management.   Pertinent  Medical History  CKD stage 4, DM, chronic diastolic congestive heart failure, hypertension, hyperlipidemia, OSA on CPAP hypertension, and morbid obesity  Significant Hospital Events:  7/29 admitted for code stroke found to have a M! Occlusion, underwent thrombectomy by IR 7/29 PCCM consulted for worsening respiratory failure  Interim History / Subjective:  Wore bipap overnight This morning is awake, alert, following commands but weak.  Weaned to nasal cannula  Objective   Blood pressure (!) 125/58, pulse 70, temperature 98.1 F (36.7 C), temperature source Axillary, resp. rate (!) 9, height 5\' 3"  (1.6 m), weight 83.4 kg, SpO2 98 %.        Intake/Output Summary (Last 24 hours) at 10/01/2020 0750 Last data filed at 10/01/2020 0600 Gross per 24 hour  Intake 1495 ml  Output 600 ml  Net 895 ml   Filed Weights   09/29/20 0344 09/30/20 0400 10/01/20 0434  Weight: 96.7 kg 83.1 kg 83.4 kg    Examination: General: chronically ill appearing, lying in the bed HEENT: Atraumatic, normocephalic, dry mucous  membranes Neuro: awake,  right sided hemiparesis, follows commands and tries to answer questions. Weak.   CV: s1s2 regular rate and rhythm, no murmur, rubs, or gallops,  PULM:  breathing non labored on nasal cannula no wheezes GI: obese, soft, nontender Extremities: warm/dry, generalized edema  Skin: no rashes or lesions   Resolved Hospital Problem list     Assessment & Plan:  Acute hypoxemic respiratory failure  Hx of OSA on CPAP Much improved with wearing BiPAP overnight. Continue nasal cannula oxygen during daytime and BiPAP overnight Head of bed elevated 30 degrees Ensure adequate pulmonary hygiene  Avoid sedation  Oliguric AKI on CKD stage 3b Her baseline Cr around 2 She looks dehydrated Serum creatinine continue to trend up We will give her IV fluid with LR Monitor BMP  Acute left MCA stroke s/p thrombectomy Likely cardioembolic with new onset A-fib  Management per neurology  Continue secondary stroke prophylaxis SLP evaluation, PT/OT  Chronic diastolic CHF Hypertension/hyperlipidemia Proximal A. fib, new onset EF 55-60% with grade II diastolic dysfunction  76/1607 Patient looks dehydrated We will stop Lasix Continue Norvasc, Lipitor, Coreg, Continuous telemetry  Hold home medications, will add back as needed  Diabetes type 2 Hold home medications include Farrxiga  Continue SSI with CBG goal 140-180 CBG q4  Goals of care  PCCM discussed 7/29 with patient's sister Horris Latino. Patient valued quality of life - would like to be DNR but would consider short course of intubation for reversible causes if good quality of life a possibility.   Best Practice (right click and "Reselect all SmartList Selections" daily)  Diet/type: tubefeeds DVT prophylaxis: SCD GI prophylaxis: PPI Lines: N/A Foley:  N/A Code Status:  DNR Last date of multidisciplinary goals of care discussion: 7/29 Rest per stroke team.    Labs   CBC: Recent Labs  Lab 09/27/20 1356  09/27/20 1401 09/28/20 0245 09/28/20 1108 09/29/20 0434 09/30/20 0133 10/01/20 0146  WBC 18.2*  --  14.3*  --  12.1* 9.9 9.8  NEUTROABS 15.9*  --   --   --   --   --   --   HGB 12.2   < > 10.6* 15.6* 9.9* 10.5* 10.6*  HCT 38.9   < > 32.0* 46.0 31.1* 31.6* 33.5*  MCV 93.3  --  88.2  --  91.2 89.0 89.8  PLT 343  --  325  --  289 262 298   < > = values in this interval not displayed.    Basic Metabolic Panel: Recent Labs  Lab 09/27/20 1356 09/27/20 1401 09/28/20 0245 09/28/20 1108 09/28/20 1352 09/28/20 1745 09/29/20 0434 09/29/20 1639 09/30/20 0133 10/01/20 0146  NA 137 135 139 138  --   --  138  --  140 142  K 4.7 4.5 3.6 3.9  --   --  4.4  --  4.6 4.5  CL 97* 101 100  --   --   --  101  --  104 106  CO2 19*  --  25  --   --   --  22  --  24 26  GLUCOSE 389* 384* 113*  --   --   --  280*  --  229* 103*  BUN 35* 33* 36*  --   --   --  51*  --  66* 83*  CREATININE 2.78* 2.60* 2.63*  --   --   --  3.32*  --  3.67* 3.98*  CALCIUM 9.1  --  8.6*  --   --   --  8.1*  --  7.9* 8.0*  MG  --   --   --   --  2.0 1.9 2.0 2.0 2.1  --   PHOS  --   --   --   --  5.4* 5.6* 5.6* 4.3  --   --    GFR: Estimated Creatinine Clearance: 13.5 mL/min (A) (by C-G formula based on SCr of 3.98 mg/dL (H)). Recent Labs  Lab 09/28/20 0245 09/29/20 0434 09/30/20 0133 10/01/20 0146  WBC 14.3* 12.1* 9.9 9.8    Liver Function Tests: Recent Labs  Lab 09/27/20 1356  AST 38  ALT 13  ALKPHOS 69  BILITOT 2.9*  PROT 6.2*  ALBUMIN 3.2*   No results for input(s): LIPASE, AMYLASE in the last 168 hours. No results for input(s): AMMONIA in the last 168 hours.  ABG    Component Value Date/Time   PHART 7.432 09/28/2020 1108   PCO2ART 36.5 09/28/2020 1108   PO2ART 89 09/28/2020 1108   HCO3 24.3 09/28/2020 1108   TCO2 25 09/28/2020 1108   O2SAT 97.0 09/28/2020 1108     Coagulation Profile: Recent Labs  Lab 09/27/20 1356  INR 1.1    Cardiac Enzymes: No results for input(s): CKTOTAL,  CKMB, CKMBINDEX, TROPONINI in the last 168 hours.  HbA1C: Hgb A1c MFr Bld  Date/Time Value Ref Range Status  09/28/2020 02:45 AM 7.8 (H) 4.8 - 5.6 % Final    Comment:    (NOTE) Pre diabetes:          5.7%-6.4%  Diabetes:              >  6.4%  Glycemic control for   <7.0% adults with diabetes   05/24/2016 12:09 AM 7.0 (H) 4.8 - 5.6 % Final    Comment:    (NOTE)         Pre-diabetes: 5.7 - 6.4         Diabetes: >6.4         Glycemic control for adults with diabetes: <7.0     CBG: Recent Labs  Lab 09/30/20 1526 09/30/20 1922 09/30/20 2303 10/01/20 0320 10/01/20 0732  GLUCAP 279* 210* 151* 71 143*   Total critical care time: 32 minutes  Performed by: [place] care time was exclusive of separately billable procedures and treating other patients.   Critical care was necessary to treat or prevent imminent or life-threatening deterioration.   Critical care was time spent personally by me on the following activities: development of treatment plan with patient and/or surrogate as well as nursing, discussions with consultants, evaluation of patient's response to treatment, examination of patient, obtaining history from patient or surrogate, ordering and performing treatments and interventions, ordering and review of laboratory studies, ordering and review of radiographic studies, pulse oximetry and re-evaluation of patient's condition.   Jacky Kindle MD [facility] See Amion for pager If no response to pager, please call [phone] until 7pm After 7pm, Please call E-link [phone]

## 2020-10-01 NOTE — Progress Notes (Signed)
Inpatient Rehabilitation Admissions Coordinator   I will place rehab consult.  Danne Baxter, RN, MSN Rehab Admissions Coordinator [phone] 10/01/2020 8:22 PM

## 2020-10-02 ENCOUNTER — Inpatient Hospital Stay (HOSPITAL_COMMUNITY): Payer: Medicare Other

## 2020-10-02 DIAGNOSIS — N179 Acute kidney failure, unspecified: Secondary | ICD-10-CM | POA: Diagnosis not present

## 2020-10-02 DIAGNOSIS — I63412 Cerebral infarction due to embolism of left middle cerebral artery: Secondary | ICD-10-CM | POA: Diagnosis not present

## 2020-10-02 LAB — BASIC METABOLIC PANEL
Anion gap: 11 (ref 5–15)
BUN: 89 mg/dL — ABNORMAL HIGH (ref 8–23)
CO2: 25 mmol/L (ref 22–32)
Calcium: 8.1 mg/dL — ABNORMAL LOW (ref 8.9–10.3)
Chloride: 106 mmol/L (ref 98–111)
Creatinine, Ser: 4.13 mg/dL — ABNORMAL HIGH (ref 0.44–1.00)
GFR, Estimated: 11 mL/min — ABNORMAL LOW (ref >60)
Glucose, Bld: 132 mg/dL — ABNORMAL HIGH (ref 70–99)
Potassium: 4.3 mmol/L (ref 3.5–5.1)
Sodium: 142 mmol/L (ref 135–145)

## 2020-10-02 LAB — CBC
HCT: 34 % — ABNORMAL LOW (ref 36.0–46.0)
Hemoglobin: 10.8 g/dL — ABNORMAL LOW (ref 12.0–15.0)
MCH: 29 pg (ref 26.0–34.0)
MCHC: 31.8 g/dL (ref 30.0–36.0)
MCV: 91.2 fL (ref 80.0–100.0)
Platelets: 296 10*3/uL (ref 150–400)
RBC: 3.73 MIL/uL — ABNORMAL LOW (ref 3.87–5.11)
RDW: 15.5 % (ref 11.5–15.5)
WBC: 9.4 10*3/uL (ref 4.0–10.5)
nRBC: 0 % (ref 0.0–0.2)

## 2020-10-02 LAB — GLUCOSE, CAPILLARY
Glucose-Capillary: 126 mg/dL — ABNORMAL HIGH (ref 70–99)
Glucose-Capillary: 85 mg/dL (ref 70–99)
Glucose-Capillary: 112 mg/dL — ABNORMAL HIGH (ref 70–99)
Glucose-Capillary: 196 mg/dL — ABNORMAL HIGH (ref 70–99)
Glucose-Capillary: 260 mg/dL — ABNORMAL HIGH (ref 70–99)
Glucose-Capillary: 315 mg/dL — ABNORMAL HIGH (ref 70–99)

## 2020-10-02 MED ORDER — HEPARIN (PORCINE) 25000 UT/250ML-% IV SOLN
1150.0000 [IU]/h | INTRAVENOUS | Status: DC
  Administered 2020-10-02: 800 [IU]/h via INTRAVENOUS
  Administered 2020-10-03 – 2020-10-06 (×4): 1150 [IU]/h via INTRAVENOUS
  Filled 2020-10-02 (×6): qty 250

## 2020-10-02 MED ORDER — FUROSEMIDE 10 MG/ML IJ SOLN
100.0000 mg | Freq: Once | INTRAVENOUS | Status: AC
  Administered 2020-10-02: 100 mg via INTRAVENOUS
  Filled 2020-10-02: qty 10

## 2020-10-02 MED ORDER — INSULIN DETEMIR 100 UNIT/ML ~~LOC~~ SOLN
8.0000 [IU] | Freq: Two times a day (BID) | SUBCUTANEOUS | Status: DC
  Administered 2020-10-02 – 2020-10-03 (×3): 8 [IU] via SUBCUTANEOUS
  Filled 2020-10-02 (×5): qty 0.08

## 2020-10-02 MED ORDER — AMLODIPINE BESYLATE 10 MG PO TABS
10.0000 mg | ORAL_TABLET | Freq: Every day | ORAL | Status: DC
  Filled 2020-10-02: qty 1

## 2020-10-02 MED ORDER — AMLODIPINE BESYLATE 10 MG PO TABS
10.0000 mg | ORAL_TABLET | Freq: Every day | ORAL | Status: DC
  Administered 2020-10-02 – 2020-10-09 (×8): 10 mg
  Filled 2020-10-02 (×7): qty 1

## 2020-10-02 MED ORDER — PROSOURCE TF PO LIQD
90.0000 mL | Freq: Two times a day (BID) | ORAL | Status: DC
  Administered 2020-10-02 – 2020-10-05 (×6): 90 mL
  Filled 2020-10-02 (×7): qty 90

## 2020-10-02 MED ORDER — OSMOLITE 1.5 CAL PO LIQD
1000.0000 mL | ORAL | Status: DC
  Administered 2020-10-02 – 2020-10-04 (×2): 1000 mL
  Filled 2020-10-02: qty 1000

## 2020-10-02 MED ORDER — CARVEDILOL 6.25 MG PO TABS
6.2500 mg | ORAL_TABLET | Freq: Two times a day (BID) | ORAL | Status: DC
  Administered 2020-10-02 – 2020-10-09 (×14): 6.25 mg
  Filled 2020-10-02 (×8): qty 1
  Filled 2020-10-02: qty 2
  Filled 2020-10-02 (×5): qty 1

## 2020-10-02 NOTE — Progress Notes (Signed)
ANTICOAGULATION CONSULT NOTE - Follow Up Consult  Pharmacy Consult for Heparin Indication: Afib / new CVA  Allergies  Allergen Reactions   Ace Inhibitors Swelling    Mouth area swelling   Atorvastatin Diarrhea and Other (See Comments)    Severe diarrhea   Benicar [Olmesartan] Swelling and Other (See Comments)    Angioedema   Epinephrine Hcl Hives        Levaquin [Levofloxacin In D5w] Swelling and Other (See Comments)    Angioedema    Losartan Swelling    Patient Measurements: Height: 5\' 3"  (160 cm) Weight: 82.2 kg (181 lb 3.5 oz) IBW/kg (Calculated) : 52.4   Vital Signs: Temp: 97.4 F (36.3 C) (08/02 1200) Temp Source: Axillary (08/02 1200) BP: 157/77 (08/02 1500) Pulse Rate: 81 (08/02 1500)  Labs: Recent Labs    09/30/20 0133 10/01/20 0146 10/02/20 0244  HGB 10.5* 10.6* 10.8*  HCT 31.6* 33.5* 34.0*  PLT 262 298 296  CREATININE 3.67* 3.98* 4.13*    Estimated Creatinine Clearance: 12.9 mL/min (A) (by C-G formula based on SCr of 4.13 mg/dL (H)).   Assessment: 70 year old female to begin heparin for Afib and new CVA 7/28 with thrombectomy 7/29  Goal of Therapy:  Heparin level 0.3 to 0.5 Monitor platelets by anticoagulation protocol: Yes   Plan:  No heparin bolus Heparin drip at 800 units / hr Daily heparin level, CBC  Thank you Anette Guarneri, PharmD  10/02/2020,3:38 PM

## 2020-10-02 NOTE — Progress Notes (Signed)
Occupational Therapy Treatment Patient Details Name: Kathleen [name] MRN: [id number] DOB: [birth year] Today's Date: 10/02/2020    History of present illness Pt is a 70 y.o. F who presents with right hemiplegia, facial droop and left gaze. Found to have distal left M1 occlusion s/p thrombectomy. Pt with new onset paroxysmal afib and acute respiratory insufficiencies with concern for ability to protect airway. Significant PMH: CKD stage 4, DM, CHF, morbid obesity, OSA on CPAP.   OT comments  PT progressing towards established OT goals. Pt performing oral care sitting EOB with Min Guard A. Pt performing sit<>stand transfers X2 with Mod A +2. Challenging strength, coordination, and attention to the R with RUE exercises and BUE reaching tasks this session. Pt requiring verbal cues throughout session to attend to task. Pt continues to present with decreased strength, balance, coordination, attention, awareness, problem solving, and command following. Due to pt support, motivation, and significant change in functional status, highly recommend discharge to CIR for intensive OT services. Will continue to follow acutely as admitted.   Follow Up Recommendations  CIR    Equipment Recommendations  3 in 1 bedside commode;Wheelchair (measurements OT);Wheelchair cushion (measurements OT);Hospital bed    Recommendations for Other Services Rehab consult    Precautions / Restrictions Precautions Precautions: Fall;Other (comment) Precaution Comments: R hemiplegia, NGT Restrictions Weight Bearing Restrictions: No       Mobility Bed Mobility Overal bed mobility: Needs Assistance Bed Mobility: Supine to Sit     Supine to sit: Mod assist     General bed mobility comments: Tactile cues for initiation of BLE's off edge of bed, use of bed pad to scoot hips anteriorly. Assist to bring RLE off of bed and elevate trunk.    Transfers Overall transfer level: Needs assistance Equipment used: 2 person hand held  assist Transfers: Sit to/from Stand Sit to Stand: Mod assist;+2 safety/equipment Stand pivot transfers: Mod assist;+2 physical assistance       General transfer comment: Mod A to rise, min-mod A to steady    Balance Overall balance assessment: Needs assistance Sitting-balance support: No upper extremity supported;Feet unsupported Sitting balance-Leahy Scale: Fair Sitting balance - Comments: Sitting EOB with Min Guard A during session   Standing balance support: No upper extremity supported;During functional activity Standing balance-Leahy Scale: Poor Standing balance comment: min-Mod A for standing balance                           ADL either performed or assessed with clinical judgement   ADL Overall ADL's : Needs assistance/impaired     Grooming: Oral care;Min guard;Sitting Grooming Details (indicate cue type and reason): Pt performing oral care using oral swab with RUE sitting EOB with Min Guard A. Pt using BUE to position oral swab in R hand. Pt requiring cues for attention to task when distractions are present.                 Toilet Transfer: Moderate assistance;+2 for physical assistance Toilet Transfer Details (indicate cue type and reason): sit<>stand transfer in preparation for participation in toileting. Toileting- Clothing Manipulation and Hygiene: Total assistance;Sit to/from stand;+2 for physical assistance Toileting - Clothing Manipulation Details (indicate cue type and reason): Total A this session for posterior pericare     Functional mobility during ADLs: Moderate assistance;+2 for safety/equipment;+2 for physical assistance (sit<>stand and steps toward [facility]) General ADL Comments: Pt performing oral care this session with min Guard A sitting EOB. Cues for attention to  task. Performing sit<>stand and taking steps toward [facility] with mod A +2.     Vision   Vision Assessment?: Vision impaired- to be further tested in functional context Additional  Comments: Pt with L gaze preference. Using compensatory techniques to scan R.   Perception     Praxis      Cognition Arousal/Alertness: Awake/alert Behavior During Therapy: Flat affect Overall Cognitive Status: Difficult to assess Area of Impairment: Following commands;Awareness;Problem solving;Attention                   Current Attention Level: Sustained   Following Commands: Follows one step commands with increased time   Awareness: Intellectual Problem Solving: Slow processing;Requires verbal cues General Comments: Pt following one step commands with increased time. Pt with sustained attention, but highly distracted by external stimuli and requiring verbal cues for redirection. Pt appropriately nodding yes and no. Pt attempting to verbalize on command at end of session, but unable. Pt oriented to location, but not time/month.        Exercises Exercises: General Upper Extremity;Other exercises General Exercises - Upper Extremity Elbow Flexion: AAROM;Right;5 reps;Seated Digit Composite Flexion: Right;AROM;Seated;5 reps Composite Extension: AROM;Right;5 reps;Seated Other Exercises Other Exercises: Sit<>stand X2 with mod A +2 Other Exercises: Challenging sitting balance, strength, coordination, attention with reaching across midline with BUE x5 this session. Pt requiring verbal cues for attention   Shoulder Instructions       General Comments VSS on 4L O2    Pertinent Vitals/ Pain       Pain Assessment: Faces Faces Pain Scale: No hurt  Home Living                                          Prior Functioning/Environment              Frequency  Min 2X/week        Progress Toward Goals  OT Goals(current goals can now be found in the care plan section)  Progress towards OT goals: Progressing toward goals  Acute Rehab OT Goals Patient Stated Goal: unable OT Goal Formulation: Patient unable to participate in goal setting Time For Goal  Achievement: 10/13/20 Potential to Achieve Goals: Fair ADL Goals Pt Will Perform Eating: with min assist;with adaptive utensils;sitting Pt Will Perform Grooming: with min assist;sitting Pt/caregiver will Perform Home Exercise Program: Both right and left upper extremity;With minimal assist;With written HEP provided Additional ADL Goal #1: Pt will increase to x5 mins of functional tasks with minimal cues to attend to task. Additional ADL Goal #2: Pt will increase to modA for bed mobility as precursor for ADL.  Plan Discharge plan remains appropriate    Co-evaluation                 AM-PAC OT "6 Clicks" Daily Activity     Outcome Measure   Help from another person eating meals?: Total Help from another person taking care of personal grooming?: A Little Help from another person toileting, which includes using toliet, bedpan, or urinal?: Total Help from another person bathing (including washing, rinsing, drying)?: A Lot Help from another person to put on and taking off regular upper body clothing?: A Lot Help from another person to put on and taking off regular lower body clothing?: Total 6 Click Score: 10    End of Session Equipment Utilized During Treatment: Oxygen;Gait belt  OT Visit Diagnosis: Unsteadiness  on feet (R26.81);Muscle weakness (generalized) (M62.81);Cognitive communication deficit (R41.841);Hemiplegia and hemiparesis Symptoms and signs involving cognitive functions: Cerebral infarction Hemiplegia - Right/Left: Right Hemiplegia - dominant/non-dominant: Dominant Hemiplegia - caused by: Cerebral infarction   Activity Tolerance Patient tolerated treatment well   Patient Left in bed;with bed alarm set;with call bell/phone within reach;with nursing/sitter in room   Nurse Communication Mobility status        Time: 9767-3419 OT Time Calculation (min): 30 min  Charges: OT General Charges $OT Visit: 1 Visit OT Treatments $Self Care/Home Management : 8-22  mins $Neuromuscular Re-education: 8-22 mins  Shanda Howells, OTDS    Shanda Howells 10/02/2020, 12:24 PM

## 2020-10-02 NOTE — Plan of Care (Signed)

## 2020-10-02 NOTE — Progress Notes (Addendum)
NAME:  Kathleen [name], MRN:  [id number], DOB:  Jun 14, 1950, LOS: 5 ADMISSION DATE:  09/27/2020, CONSULTATION DATE:  09/25/2020 REFERRING MD:  Dr. Erlinda Hong, CHIEF COMPLAINT:  Acute stroke   History of Present Illness:  Kathleen [name] is a 70 y.o. female with a PMH significant for CKD stage 4, DM, chronic diastolic congestive heart failure, hypertension, hyperlipidemia, OSA on CPAP hypertension, and morbid obesity who presented to the emergency department as a code stroke. She has acute right hemiplegia with right facial droop and leftward gaze. She was admitted under stroke status post thrombectomy of left M1 occlusion. Patient was extubated post procedure and able to follow commands.  By afternoon of 7/29 patient was seen with decreased mentation and snoring respirations with concern for ability to protect her airway. PCCM was consulted for further airway management.   Pertinent  Medical History  CKD stage 4, DM, chronic diastolic congestive heart failure, hypertension, hyperlipidemia, OSA on CPAP hypertension, and morbid obesity  Significant Hospital Events:  7/29 admitted for code stroke found to have a M! Occlusion, underwent thrombectomy by IR 7/29 PCCM consulted for worsening respiratory failure  Interim History / Subjective:  Wore bipap overnight This morning is awake, alert, following commands Serum creatinine continue to rise now over 4  Objective   Blood pressure (!) 154/97, pulse 62, temperature (!) 97.5 F (36.4 C), temperature source Axillary, resp. rate 13, height 5\' 3"  (1.6 m), weight 82.2 kg, SpO2 97 %.        Intake/Output Summary (Last 24 hours) at 10/02/2020 0930 Last data filed at 10/02/2020 0600 Gross per 24 hour  Intake 3123.98 ml  Output 745 ml  Net 2378.98 ml   Filed Weights   09/30/20 0400 10/01/20 0434 10/02/20 0341  Weight: 83.1 kg 83.4 kg 82.2 kg    Examination: General: Acute on chronically ill appearing, sitting in the chair HEENT: Atraumatic,  normocephalic, dry mucous membranes Neuro: Alert, awake, right facial droop, all 4 extremities are antigravity right-sided weaker than left CV: s1s2 regular rate and rhythm, no murmur, rubs, or gallops,  PULM:  breathing non labored on nasal cannula no wheezes GI: obese, soft, nontender Extremities: warm/dry, generalized edema  Skin: no rashes or lesions   Resolved Hospital Problem list     Assessment & Plan:  Acute hypoxemic respiratory failure  Hx of OSA on CPAP Much improved with wearing BiPAP overnight. Continue nasal cannula oxygen during daytime and BiPAP overnight Head of bed elevated 30 degrees Ensure adequate pulmonary hygiene  Avoid sedation  Oliguric AKI on CKD stage 3b Her baseline Cr around 2 She was started on IV fluid but serum creatinine continue to trend up, now its over 4 DC IV fluid Will give Lasix saline with 100 mg x 1 Monitor intake and output and electrolytes Monitor BMP  Acute left MCA stroke s/p thrombectomy Likely cardioembolic with new onset A-fib  Management per neurology  Continue secondary stroke prophylaxis SLP evaluation, PT/OT  Chronic diastolic CHF Hypertension/hyperlipidemia Proximal A. fib, new onset EF 55-60% with grade II diastolic dysfunction  17/5102 Continue Lasix Continue Norvasc, Lipitor, Coreg, Continuous telemetry   Diabetes type 2 Hold home medications include Farrxiga  Continue SSI with CBG goal 140-180 CBG q4  Goals of care  PCCM discussed 7/29 with patient's sister Horris Latino. Patient valued quality of life - would like to be DNR but would consider short course of intubation for reversible causes if good quality of life a possibility.    Best Practice (right click  and "Reselect all SmartList Selections" daily)   Diet/type: tubefeeds DVT prophylaxis: SCD GI prophylaxis: PPI Lines: N/A Foley:  N/A Code Status:  DNR Last date of multidisciplinary goals of care discussion: 7/29 Rest per stroke team.    Labs    CBC: Recent Labs  Lab 09/27/20 1356 09/27/20 1401 09/28/20 0245 09/28/20 1108 09/29/20 0434 09/30/20 0133 10/01/20 0146 10/02/20 0244  WBC 18.2*  --  14.3*  --  12.1* 9.9 9.8 9.4  NEUTROABS 15.9*  --   --   --   --   --   --   --   HGB 12.2   < > 10.6* 15.6* 9.9* 10.5* 10.6* 10.8*  HCT 38.9   < > 32.0* 46.0 31.1* 31.6* 33.5* 34.0*  MCV 93.3  --  88.2  --  91.2 89.0 89.8 91.2  PLT 343  --  325  --  289 262 298 296   < > = values in this interval not displayed.    Basic Metabolic Panel: Recent Labs  Lab 09/28/20 0245 09/28/20 1108 09/28/20 1352 09/28/20 1745 09/29/20 0434 09/29/20 1639 09/30/20 0133 10/01/20 0146 10/02/20 0244  NA 139 138  --   --  138  --  140 142 142  K 3.6 3.9  --   --  4.4  --  4.6 4.5 4.3  CL 100  --   --   --  101  --  104 106 106  CO2 25  --   --   --  22  --  24 26 25   GLUCOSE 113*  --   --   --  280*  --  229* 103* 132*  BUN 36*  --   --   --  51*  --  66* 83* 89*  CREATININE 2.63*  --   --   --  3.32*  --  3.67* 3.98* 4.13*  CALCIUM 8.6*  --   --   --  8.1*  --  7.9* 8.0* 8.1*  MG  --   --  2.0 1.9 2.0 2.0 2.1  --   --   PHOS  --   --  5.4* 5.6* 5.6* 4.3  --   --   --    GFR: Estimated Creatinine Clearance: 12.9 mL/min (A) (by C-G formula based on SCr of 4.13 mg/dL (H)). Recent Labs  Lab 09/29/20 0434 09/30/20 0133 10/01/20 0146 10/02/20 0244  WBC 12.1* 9.9 9.8 9.4    Liver Function Tests: Recent Labs  Lab 09/27/20 1356  AST 38  ALT 13  ALKPHOS 69  BILITOT 2.9*  PROT 6.2*  ALBUMIN 3.2*   No results for input(s): LIPASE, AMYLASE in the last 168 hours. No results for input(s): AMMONIA in the last 168 hours.  ABG    Component Value Date/Time   PHART 7.432 09/28/2020 1108   PCO2ART 36.5 09/28/2020 1108   PO2ART 89 09/28/2020 1108   HCO3 24.3 09/28/2020 1108   TCO2 25 09/28/2020 1108   O2SAT 97.0 09/28/2020 1108     Coagulation Profile: Recent Labs  Lab 09/27/20 1356  INR 1.1    Cardiac Enzymes: No results  for input(s): CKTOTAL, CKMB, CKMBINDEX, TROPONINI in the last 168 hours.  HbA1C: Hgb A1c MFr Bld  Date/Time Value Ref Range Status  09/28/2020 02:45 AM 7.8 (H) 4.8 - 5.6 % Final    Comment:    (NOTE) Pre diabetes:          5.7%-6.4%  Diabetes:              >  6.4%  Glycemic control for   <7.0% adults with diabetes   05/24/2016 12:09 AM 7.0 (H) 4.8 - 5.6 % Final    Comment:    (NOTE)         Pre-diabetes: 5.7 - 6.4         Diabetes: >6.4         Glycemic control for adults with diabetes: <7.0     CBG: Recent Labs  Lab 10/01/20 1525 10/01/20 1915 10/01/20 2314 10/02/20 0325 10/02/20 0731  GLUCAP 230* 231* 220* 126* 85   Total critical care time: 31 minutes  Performed by: [place] care time was exclusive of separately billable procedures and treating other patients.   Critical care was necessary to treat or prevent imminent or life-threatening deterioration.   Critical care was time spent personally by me on the following activities: development of treatment plan with patient and/or surrogate as well as nursing, discussions with consultants, evaluation of patient's response to treatment, examination of patient, obtaining history from patient or surrogate, ordering and performing treatments and interventions, ordering and review of laboratory studies, ordering and review of radiographic studies, pulse oximetry and re-evaluation of patient's condition.   Jacky Kindle MD [facility] See Amion for pager If no response to pager, please call [phone] until 7pm After 7pm, Please call E-link [phone]

## 2020-10-02 NOTE — Progress Notes (Signed)
CSW continuing to follow for discharge needs; pending CIR consult.   Gilmore Laroche, MSW, [facility]

## 2020-10-02 NOTE — TOC Initial Note (Signed)
Transition of Care [facility]) - Initial/Assessment Note    Patient Details  Name: Kathleen [name] MRN: [id number] Date of Birth: [birth year]  Transition of Care [facility]) CM/SW Contact:    Benard Halsted, [place] Phone Number: 10/02/2020, 4:30 PM  Clinical Narrative:                 CSW received consult for possible SNF placement at time of discharge since CIR unable to accept patient. CSW spoke with patient's sister. Patient's sister reported that she lives out of town and patient's family is currently unable to care for patient at their home given patient's current physical needs and fall risk. She expressed understanding of PT recommendation and is agreeable to SNF placement at time of discharge. She is going to try to see which facilities her mother had been in as they were fine choices (she believes [facility] and somewhere else). CSW discussed insurance authorization process and provided Medicare SNF ratings list via email ([email or web address]). Patient has  received two COVID vaccines. CSW also discussed Medicaid process if long term care is ever needed. No further questions reported at this time.   Skilled Nursing Rehab Facilities-   [email or web address] *Ratings updated quarterly   Ratings out of 5 possible   Name Address  Phone # [place] Inspection Overall  [facility] [address], [place] 5  4 4   [facility]  [address] [place], [place] [phone] 4 2 4 4   [facility] [place], [place] 2 3 1 1   [place], [place] 3 3 4 4   [facility] [name], [place] 3  2 2   [facility] 2 2 4 4   [facility] [address] [place] 5 2 2 3   [facility] [address], [place] 4 2 2 2   [facility] [place], [place] [phone] 5 2 2 3   [facility] [phone], [place] [phone] 5 1 2 2   [facility] [address], [place] [facility] [phone] 5 2 2 3   [place] [place] [facility] [phone] 3 1 1 1      Expected Discharge Plan: [place] Barriers to Discharge: Continued Medical Work up   Patient Goals and CMS Choice Patient states their goals for this hospitalization and ongoing recovery are:: Rehab CMS Medicare.gov Compare Post Acute Care list provided to:: Patient Represenative (must comment) (Sister) Choice offered to / list presented to : Sibling  Expected Discharge Plan and Services Expected Discharge Plan: [place] In-house Referral: Clinical Social Work   Post Acute Care Choice: [place] Living arrangements for the past 2 months: [place]                                      Prior Living Arrangements/Services Living arrangements for the past 2 months: Single Family Home Lives with:: Self Patient language and need for interpreter reviewed:: Yes Do you feel safe going back to the place where you live?: Yes      Need for Family Participation in Patient Care: Yes (Comment) Care giver support system in place?: Yes (comment)   Criminal Activity/Legal Involvement Pertinent to Current Situation/Hospitalization: No - Comment as needed  Activities of Daily Living Home Assistive Devices/Equipment:  None ADL Screening (condition at time of admission) Patient's cognitive ability adequate to safely complete daily activities?: No Is the patient deaf or have difficulty hearing?: No Does the patient have difficulty seeing, even when wearing glasses/contacts?: No Does the patient have difficulty concentrating, remembering, or making decisions?: Yes Patient able to express need for assistance with ADLs?: Yes Does the patient have difficulty dressing or  bathing?: Yes Independently performs ADLs?: No Communication: Needs assistance Is this a change from baseline?: Change from baseline, expected to last >3 days Dressing (OT): Dependent Is this a change from baseline?: Change from baseline, expected to last >3 days Grooming: Dependent Is this a change from baseline?: Change from baseline, expected to last >3 days Feeding: Dependent Is this a change from baseline?: Change from baseline, expected to last >3 days Bathing: Dependent Is this a change from baseline?: Change from baseline, expected to last >3 days Toileting: Dependent Is this a change from baseline?: Change from baseline, expected to last >3days In/Out Bed: Dependent Is this a change from baseline?: Change from baseline, expected to last >3 days Walks in Home: Dependent Is this a change from baseline?: Change from baseline, expected to last >3 days Does the patient have difficulty walking or climbing stairs?: Yes Weakness of Legs: Both Weakness of Arms/Hands: Both  Permission Sought/Granted Permission sought to share information with : Facility Sport and exercise psychologist, Family Supports Permission granted to share information with : Yes, Verbal Permission Granted  Share Information with NAME: Horris Latino  Permission granted to share info w AGENCY: SNFs  Permission granted to share info w Relationship: Sister  Permission granted to share info w Contact Information: [phone]  Emotional Assessment Appearance:: Appears stated age Attitude/Demeanor/Rapport: Unable to Assess Affect (typically observed): Unable to Assess Orientation: : Oriented to Self, Oriented to Place Alcohol / Substance Use: Not Applicable Psych Involvement: No (comment)  Admission diagnosis:  Stroke (cerebrum) ([place]) [I63.9] Cerebrovascular accident (CVA), unspecified mechanism ([place]) [I63.9] Patient Active Problem List   Diagnosis Date Noted   Pressure injury of skin 09/28/2020   Stroke (cerebrum) ([place])  09/27/2020   Uncontrolled type 2 diabetes mellitus with hypoglycemia without coma ([place]) 08/26/2017   Hypertensive heart disease 06/23/2016   CRI (chronic renal insufficiency), stage 3 (moderate) ([place]) 06/23/2016   Left rib fracture 05/25/2016   Pneumothorax on left 05/23/2016   PVC's (premature ventricular contractions) 11/28/2015   OSA (obstructive sleep apnea) 04/30/2015   Obesity (BMI 30-39.9) 04/30/2015   Status post gastric banding 02/27/2015   Varicose veins of leg with complications 34/19/3790   Metatarsal deformity 10/04/2013   Nonspecific abnormal unspecified cardiovascular function study 09/29/2013   Mixed hyperlipidemia 09/22/2013   Tenosynovitis of foot and ankle 09/14/2013   Pronation deformity of ankle, acquired 09/14/2013   Deformity of metatarsal bone of right foot 09/14/2013   Deformity of metatarsal bone of left foot 09/14/2013   Proteinuria 07/28/2012   DYSPNEA 01/11/2008   HYPERTHYROIDISM 05/10/2007   UNSPECIFIED DISORDER OF THYROID 05/06/2007   DIABETES, TYPE 2 03/22/2007   HYPERLIPIDEMIA 03/22/2007   Essential hypertension 03/22/2007   C O P D 03/22/2007   MEDIASTINAL ADENOPATHY [place] D 03/22/2007   OXYGEN-USE OF SUPPLEMENTAL 03/22/2007   PCP:  Kristen Loader, FNP Pharmacy:   [id number] [place], [place] - [address] [place] [address] Phone: [phone] Fax: [phone]     Social Determinants of Health (SDOH) Interventions    Readmission Risk Interventions No flowsheet data found.

## 2020-10-02 NOTE — Progress Notes (Signed)
Nutrition Follow-up  DOCUMENTATION CODES:   Obesity unspecified  INTERVENTION:   Tube feeding via post pyloric cortrak tube: Osmolite 1.5 @ 45 ml/h (1080 ml per day) 90 ml ProSource BID  Provides 1749 kcal, 111 gm protein, 820 ml free water daily   NUTRITION DIAGNOSIS:   Inadequate oral intake related to inability to eat as evidenced by NPO status. Ongoing.   GOAL:   Patient will meet greater than or equal to 90% of their needs Met with TF.   MONITOR:   TF tolerance, Labs, Skin  REASON FOR ASSESSMENT:   Consult Enteral/tube feeding initiation and management  ASSESSMENT:   Pt with PMH of bilateral carotid artery plaque, CKD4, DM, HLD, CHF, HTN, hyperthyroidism, morbid obesity, OSA on CPAP, proliferative retinopathy, pulmonary HTN, gastric band surgery 2010 and revision in 2011 now admitted after being found down by neighbor with L MCA stroke.    Pt discussed during ICU rounds and with RN.  Per CTA pt with L M1 occlusion.  Pt remains aphasic.   7/28 s/p thrombectomy  7/29 cortrak attempted unable to advance due to gastric band; tube advanced by radiology  8/2 s/p MBS ok to have some purees and honey thick liquids from the floor   Medications reviewed and include: SSI, levemir, protonix, miralax  Lasix x 1   Labs reviewed: Cr: 4.13 CBG's: 85-220   UOP: 745 ml  I&O: + 6 L  Mld edema noted  Current TF: Vital 1.2 at 65 ml/h  Provides 1872 kcal, 117 gm protein  Diet Order:   Diet Order             Diet NPO time specified Except for: Other (See Comments)  Diet effective now                   EDUCATION NEEDS:   Not appropriate for education at this time  Skin:  Skin Assessment:  (stage 1/MASD: buttocks)  Last BM:  8/2 small; type 6  Height:   Ht Readings from Last 1 Encounters:  09/27/20 5' 3"  (1.6 m)    Weight:   Wt Readings from Last 1 Encounters:  10/02/20 82.2 kg    BMI:  Body mass index is 32.1 kg/m.  Estimated Nutritional  Needs:   Kcal:  1800-2000  Protein:  100-120 grams  Fluid:  > 1.8 L/day  Lockie Pares., RD, LDN, CNSC See AMiON for contact information

## 2020-10-02 NOTE — Progress Notes (Addendum)
Patient ID: Kathleen [name], female   DOB: 12-Feb-1951, 70 y.o.   MRN: [id number] Patient ID: Kathleen [name], female   DOB: 04-19-1950, 70 y.o.   MRN: [id number] STROKE TEAM PROGRESS NOTE   INTERVAL HISTORY Her sister is at the bedside.  Patient sitting up in bed.  She is scheduled to go for modified barium for swallow eval later this morning.   .  She remains on tube feeds.  She tolerating nasal cannula.  Plans are to transfer to the floor.   Vital signs stable.  Neurological exam unchanged   Vitals:   10/02/20 0800 10/02/20 0900 10/02/20 1000 10/02/20 1200  BP: (!) 170/84 (!) 148/97 (!) 178/83   Pulse: 74 76    Resp: 14 (!) 22 13   Temp: (!) 97.5 F (36.4 C)   (!) 97.4 F (36.3 C)  TempSrc: Axillary   Axillary  SpO2: 96% 99%    Weight:      Height:       CBC:  Recent Labs  Lab 09/27/20 1356 09/27/20 1401 10/01/20 0146 10/02/20 0244  WBC 18.2*   < > 9.8 9.4  NEUTROABS 15.9*  --   --   --   HGB 12.2   < > 10.6* 10.8*  HCT 38.9   < > 33.5* 34.0*  MCV 93.3   < > 89.8 91.2  PLT 343   < > 298 296   < > = values in this interval not displayed.   Basic Metabolic Panel:  Recent Labs  Lab 09/29/20 0434 09/29/20 1639 09/30/20 0133 10/01/20 0146 10/02/20 0244  NA 138  --  140 142 142  K 4.4  --  4.6 4.5 4.3  CL 101  --  104 106 106  CO2 22  --  24 26 25   GLUCOSE 280*  --  229* 103* 132*  BUN 51*  --  66* 83* 89*  CREATININE 3.32*  --  3.67* 3.98* 4.13*  CALCIUM 8.1*  --  7.9* 8.0* 8.1*  MG 2.0 2.0 2.1  --   --   PHOS 5.6* 4.3  --   --   --    Lipid Panel:  Recent Labs  Lab 09/30/20 0133  CHOL 140  TRIG 63  HDL 50  CHOLHDL 2.8  VLDL 13  LDLCALC 77   HgbA1c:  Recent Labs  Lab 09/28/20 0245  HGBA1C 7.8*   Urine Drug Screen:  Recent Labs  Lab 09/28/20 0951  LABOPIA NONE DETECTED  COCAINSCRNUR NONE DETECTED  LABBENZ NONE DETECTED  AMPHETMU NONE DETECTED  THCU NONE DETECTED  LABBARB NONE DETECTED    Alcohol Level  Recent Labs  Lab 09/27/20 1356  ETH  <10    IMAGING past 24 hours No results found.  PHYSICAL EXAM General: Obese elderly Caucasian lady.  On [place]. No acute distress. Appears comfortable. HENT: [place] in place. Head: Normocephalic.  Skin: no obvious lesions.    Neurological Examination Mental Status: More alert today, sitting up in bed.  Expressive greater than receptive aphasia nodded yes/no to questions. She followed all commands.Non verbal. Did not name objects.  Cranial Nerves: Left gaze pref, but will look to right and cross midline. Right facial droop.  Motor:Tone and bulk:normal tone throughout; moving left side well . Right side weakness 4/5. Deep Tendon Reflexes: 2+ and symmetric throughout Plantars: Right: downgoing   Left: downgoing Cerebellar: no gross ataxia. Gait: unable due to aphasia  ASSESSMENT/PLAN Ms. NIKIA LEVELS is a 70 y.o.  female acute right hemiplegia, right facial droop, leftward gaze and global aphasia. NIHSS 27 on admission. CTA showed distal Left M1 occlusion with favorable perfusion missmatich. She was outside of time window for IV tPA, but was emergently treated with thrombectomy 09/28/20 with TICI 3 restoration of flow Dr Earleen Newport.  Stroke:  left MCA infarct; cardioembolic secondary to Afib (new dx, not on [place])  Code Stroke CT head No acute abnormality. Small vessel disease. Atrophy. ASPECTS 7.   CTA head & neck distal left M1 or proximal M2 MCA in the anterior aspect of the left sylvian fissure (series 10, images 85 through 88) which correlates with the hyperdensity seen on same day noncontrast head CT and is concerning for a high-grade stenosis or occlusion. The remainder of the CTA is essentially nondiagnostic in the head and neck due to non arterial timing. CT perfusion 49ml core in anterior LMCA. 47ml detected penumbra MRI  completed. Acute left MCA ischemic CVA. Petechial hemorrhage. No shift.  MRA revascularization of proximal MCA noted. 2D Echo completed. EF 60-65%. Left atrial  dilatation. No shunt/no thrombus. LDL 91 HgbA1c 7.8 VTE prophylaxis - scds    Diet   Diet NPO time specified Except for: Other (See Comments)   none  prior to admission, now on aspirin 325mg  Via cortrak  Therapy recommendations:  CIR recommended Disposition:  pending  Hypertension Home meds:  Aldactone, norvasc, coreg, chlorthalidone Stable Permissive hypertension (OK if < 220/120) but gradually normalize in 5-7 days Long-term BP goal normotensive  Hyperlipidemia Home meds:  lipitor 80mg , resumed in hospital LDL 91, goal < 70 Add   High intensity statin  Continue statin at discharge  Diabetes type II Uncontrolled Home meds:  Farxiga, levemir flexpen, novolog HgbA1c 7.8, goal < 7.0 CBGs Recent Labs    10/02/20 0731 10/02/20 1120 10/02/20 1506  GLUCAP 85 112* 196*    SSI Sugars still elevated. Increase levemir insulin to 12 units BID, managed by CCM  Pulmonary: tolerated BiPaP  Other Stroke Risk Factors Advanced Age >/= 55  Former Cigarette smoker; quit 22yrs ago Obesity, Body mass index is 32.1 kg/m., BMI >/= 30 associated with increased stroke risk, recommend weight loss, diet and exercise as appropriate  CAD Carotid artery disease Obstructive sleep apnea, on CPAP at home- RT consulted for to add this for daytime or nighttime sleeping.  Right side heart failure- stopped IVF and ordred Lasix 20mg . Foley catheter for strict I&Os.  New dx of Afib- 12lead ordered to confirm-not done yet.   Other Active Problems Morbid obesity- s/p gastric banding in 2010 with revision in 2020 Hyperthyroidism- on methimazole at home Dysphasia- d/t stroke-  Cortrak for nutrition and meds Atrial fibrillation - newly diagnosed - ASA 325 mg daily - anticoagulate when appropriate from stroke standpoint. CKD stage IV - creatinine - 3.98 Anemia - Hgb - 12.2->10.5->10.6 Code status - DNR NPO [facility] day # 5 Plan continue mobilize out of bed.  Continue ongoing  therapies.Mar[place] Kitchen  Speech therapy to do modified barium swallow eval today.  We will transfer to neurology floor when bed available.  Continue BiPAP at night.  Patient is DNR and does not want intubation.  Start iv hepairin drip for anticoagulation for her new AFIB and stop aspirin . Discussed with patient and sister at the bedside and answered questions.  Discussed with Dr. Tacy Learn critical care medicine.  Consult medical hospitalist team to help with medical management on the floor and assume primary care.  This patient is  critically ill due to left MCA stroke, status post thrombectomy, new diagnosed A. fib, respiratory distress and at significant risk of neurological worsening, death form respiratory failure, recurrent stroke, hemorrhagic conversion, seizure, heart failure. This patient's care requires constant monitoring of vital signs, hemodynamics, respiratory and cardiac monitoring, review of multiple databases, neurological assessment, discussion with family, other specialists and medical decision making of high complexity. I spent 32 minutes of neurocritical care time in the care of this patient.  Antony Contras, MD

## 2020-10-02 NOTE — Progress Notes (Deleted)
ANTICOAGULATION CONSULT NOTE - Initial Consult  Pharmacy Consult for Heparin Indication: atrial fibrillation  Allergies  Allergen Reactions   Ace Inhibitors Swelling    Mouth area swelling   Atorvastatin Diarrhea and Other (See Comments)    Severe diarrhea   Benicar [Olmesartan] Swelling and Other (See Comments)    Angioedema   Epinephrine Hcl Hives        Levaquin [Levofloxacin In D5w] Swelling and Other (See Comments)    Angioedema    Losartan Swelling    Patient Measurements: Height: 5\' 3"  (160 cm) Weight: 82.2 kg (181 lb 3.5 oz) IBW/kg (Calculated) : 52.4   Vital Signs: Temp: 97.4 F (36.3 C) (08/02 1200) Temp Source: Axillary (08/02 1200) BP: 157/77 (08/02 1500) Pulse Rate: 81 (08/02 1500)  Labs: Recent Labs    09/30/20 0133 10/01/20 0146 10/02/20 0244  HGB 10.5* 10.6* 10.8*  HCT 31.6* 33.5* 34.0*  PLT 262 298 296  CREATININE 3.67* 3.98* 4.13*    Estimated Creatinine Clearance: 12.9 mL/min (A) (by C-G formula based on SCr of 4.13 mg/dL (H)).   Medical History: Past Medical History:  Diagnosis Date   Carotid artery plaque    duplex 07/2020 1-39% BICA   CKD (chronic kidney disease), stage IV (HCC)    Complication of anesthesia    pt states stopped breathing when having tonsillectomy at age 89; pt states has had anesthesia since then without difficulty    Diabetes mellitus    Glaucoma    Heart disease    Hyperlipidemia    Hypertension    Hyperthyroidism    Menopause    Mixed hyperlipidemia 09/22/2013   Morbid obesity ([place])    Multinodular goiter    AND HYPERTHYROIDISM    Obesity (BMI 30-39.9) 04/30/2015   OSA on CPAP    Proliferative retinopathy    moderate, Dr. Posey Pronto   Proteinuria    Dr. Hassell Done   Pulmonary hypertension ([place])    PVC's (premature ventricular contractions)    Right-sided heart failure (HCC)    Sinus bradycardia    Trigger finger    Dr. Rip Harbour    Assessment: 70 y.o. female with a PMH significant for CKD stage 4, DM,  chronic diastolic congestive heart failure, hypertension, hyperlipidemia, OSA on CPAP hypertension, and morbid obesity who presented to the emergency department as a code stroke. Patient with new onset a fib. Pharmacy consulted to start heparin drip.   Goal of Therapy:  Heparin level 0.3-0.5 units/ml Monitor platelets by anticoagulation protocol: Yes   Plan:  Start heparin infusion at 1050 units/hr Check anti-Xa level in 8 hours and daily while on heparin Continue to monitor H&H and platelets  Alanda Slim, PharmD, [facility] Clinical Pharmacist Please see AMION for all Pharmacists' Contact Phone Numbers 10/02/2020, 3:42 PM

## 2020-10-02 NOTE — Progress Notes (Signed)
Modified Barium Swallow Progress Note  Patient Details  Name: Kathleen [name] MRN: [id number] Date of Birth: 1951-02-15  Today's Date: 10/02/2020  Modified Barium Swallow completed.  Full report located under Chart Review in the Imaging Section.  Brief recommendations include the following:  Clinical Impression  Pt demonstrates a moderate oropharygneal dysphagia with lingual weakness and difficulty initiating appropriate movements without sensory feedback. Pt started study likely with standing pharyngeal secretions given decreased frequency of swallowing. Pt had slow, but immediate lingual propulsion of teaspoon boluses with moderate lingual residue remaining. Boluses were transtited slowly to pharynx with swallow initiated at or past valleculae with accumulating BOT and vallecular residue typically trigering a second swallow. Pt responded to trace aspiration of nectar (and likely secretions) before and after the swallow with a suppressed cough drive. Teaspoons of honey and puree were tolerated well. Initial coughing expected with sips as pt may aspirate pharyngeal secretions when starting to swallow after a period of rest. Recommend pt be given a few bites of puree and honey teaspoon from floor stock for therapeutic swallowing about every hour. Will consider advancing to a diet as automaticity improves.   Swallow Evaluation Recommendations       SLP Diet Recommendations: Other (Comment);Honey thick liquids;Dysphagia 1 (Puree) solids   Liquid Administration via: Spoon   Medication Administration: Via alternative means               Oral Care Recommendations: Oral care BID   Other Recommendations: Have oral suction available   Herbie [place], MA [place] Pager [phone] Office [phone]  Lynann Beaver 10/02/2020,12:28 PM

## 2020-10-02 NOTE — Progress Notes (Signed)
Inpatient Rehabilitation Admissions Coordinator     Inpatient rehab consult received. I met with patient at bedside and spoke with her sister, Horris Latino, by phone. Patient does not have caregiver supports to support a CIR admit. Horris Latino is requesting SNF. I have alerted acute teem and TOC. We will sign off at this time.  Danne Baxter, RN, MSN Rehab Admissions Coordinator [phone] 10/02/2020 4:18 PM

## 2020-10-02 NOTE — Progress Notes (Signed)
Physical Therapy Treatment Patient Details Name: Kathleen [name] MRN: [id number] DOB: [birth year] Today's Date: 10/02/2020    History of Present Illness Pt is a 70 y.o. F who presents with right hemiplegia, facial droop and left gaze. Found to have distal left M1 occlusion s/p thrombectomy. Pt with new onset paroxysmal afib and acute respiratory insufficiencies with concern for ability to protect airway. Significant PMH: CKD stage 4, DM, CHF, morbid obesity, OSA on CPAP.    PT Comments    Pt progressing well towards her physical therapy goals. Able to participate in therapeutic exercises for strengthening and functional mobility. Pt requiring two person moderate assist for taking pivotal steps from bed to chair and then performing subsequent sit to stands. She is able to functionally use a walker with assist to bring right hand up. Continue to recommend comprehensive inpatient rehab (CIR) for post-acute therapy needs.    Follow Up Recommendations  CIR     Equipment Recommendations  3in1 (PT);Wheelchair (measurements PT);Wheelchair cushion (measurements PT);Rolling walker with 5" wheels    Recommendations for Other Services Rehab consult     Precautions / Restrictions Precautions Precautions: Fall;Other (comment) Precaution Comments: R hemiplegia, NGT Restrictions Weight Bearing Restrictions: No    Mobility  Bed Mobility Overal bed mobility: Needs Assistance Bed Mobility: Supine to Sit     Supine to sit: Mod assist     General bed mobility comments: Tactile cues for initiation of BLE's off edge of bed, use of bed pad to scoot hips anteriorly, light assist to execute sitting up    Transfers Overall transfer level: Needs assistance Equipment used: None Transfers: Sit to/from [facility] Sit to Stand: Mod assist;+2 safety/equipment Stand pivot transfers: Mod assist;+2 physical assistance       General transfer comment: ModA to rise, assist for placing R  hand on walker, cues for left hand placement. Pt taking pivotal steps over to recliner, decreased R foot clearance and step length  Ambulation/Gait                 Stairs             Wheelchair Mobility    Modified Rankin (Stroke Patients Only) Modified Rankin (Stroke Patients Only) Pre-Morbid Rankin Score: No symptoms Modified Rankin: Severe disability     Balance Overall balance assessment: Needs assistance Sitting-balance support: Feet supported Sitting balance-Leahy Scale: Fair     Standing balance support: No upper extremity supported;During functional activity Standing balance-Leahy Scale: Poor                              Cognition Arousal/Alertness: Awake/alert Behavior During Therapy: Flat affect Overall Cognitive Status: Difficult to assess Area of Impairment: Following commands                       Following Commands: Follows one step commands with increased time       General Comments: Pt nonverbal, consistently nods yes/no. Requires occasional repetition/visual demonstration for following 1 step motor commands      Exercises General Exercises - Upper Extremity Shoulder Flexion: AAROM;Right;10 reps;Supine Elbow Flexion: AAROM;Right;10 reps;Supine Digit Composite Flexion: Right;Supine;AAROM;20 reps Composite Extension: Right;Supine;AAROM;Other (comment) (20 reps) General Exercises - Lower Extremity Ankle Circles/Pumps: Both;Supine;10 reps Heel Slides: 10 reps;Supine;Both Hip ABduction/ADduction: Both;10 reps;Supine Other Exercises Other Exercises: Mini bridging x 5 Other exercises: x 2 sit to stands from chair    General Comments General comments (skin integrity,  edema, etc.): VSS on 4L O2      Pertinent Vitals/Pain Pain Assessment: Faces Faces Pain Scale: No hurt    Home Living                      Prior Function            PT Goals (current goals can now be found in the care plan section)  Acute Rehab PT Goals Patient Stated Goal: unable PT Goal Formulation: Patient unable to participate in goal setting Time For Goal Achievement: 10/13/20 Potential to Achieve Goals: Fair Progress towards PT goals: Progressing toward goals    Frequency    Min 4X/week      PT Plan Current plan remains appropriate    Co-evaluation              AM-PAC PT "6 Clicks" Mobility   Outcome Measure  Help needed turning from your back to your side while in a flat bed without using bedrails?: A Little Help needed moving from lying on your back to sitting on the side of a flat bed without using bedrails?: A Little Help needed moving to and from a bed to a chair (including a wheelchair)?: A Lot Help needed standing up from a chair using your arms (e.g., wheelchair or bedside chair)?: A Lot Help needed to walk in hospital room?: Total Help needed climbing 3-5 steps with a railing? : Total 6 Click Score: 12    End of Session Equipment Utilized During Treatment: Oxygen Activity Tolerance: Patient tolerated treatment well Patient left: with call bell/phone within reach;in bed;with chair alarm set Nurse Communication: Mobility status PT Visit Diagnosis: Other abnormalities of gait and mobility (R26.89);Hemiplegia and hemiparesis Hemiplegia - Right/Left: Right Hemiplegia - caused by: Cerebral infarction     Time: 0820-0847 PT Time Calculation (min) (ACUTE ONLY): 27 min  Charges:  $Therapeutic Exercise: 8-22 mins $Therapeutic Activity: 8-22 mins                     Wyona Almas, PT, DPT Acute Rehabilitation Services Pager [phone] Office [phone]    Deno Etienne 10/02/2020, 9:23 AM

## 2020-10-03 ENCOUNTER — Encounter (HOSPITAL_COMMUNITY): Payer: Self-pay | Admitting: Neurology

## 2020-10-03 DIAGNOSIS — E669 Obesity, unspecified: Secondary | ICD-10-CM | POA: Diagnosis not present

## 2020-10-03 DIAGNOSIS — R1310 Dysphagia, unspecified: Secondary | ICD-10-CM

## 2020-10-03 DIAGNOSIS — R1319 Other dysphagia: Secondary | ICD-10-CM | POA: Diagnosis not present

## 2020-10-03 DIAGNOSIS — N183 Chronic kidney disease, stage 3 unspecified: Secondary | ICD-10-CM

## 2020-10-03 DIAGNOSIS — I10 Essential (primary) hypertension: Secondary | ICD-10-CM | POA: Diagnosis not present

## 2020-10-03 DIAGNOSIS — I63412 Cerebral infarction due to embolism of left middle cerebral artery: Secondary | ICD-10-CM | POA: Diagnosis not present

## 2020-10-03 LAB — GLUCOSE, CAPILLARY
Glucose-Capillary: 243 mg/dL — ABNORMAL HIGH (ref 70–99)
Glucose-Capillary: 251 mg/dL — ABNORMAL HIGH (ref 70–99)
Glucose-Capillary: 253 mg/dL — ABNORMAL HIGH (ref 70–99)
Glucose-Capillary: 260 mg/dL — ABNORMAL HIGH (ref 70–99)
Glucose-Capillary: 322 mg/dL — ABNORMAL HIGH (ref 70–99)
Glucose-Capillary: 324 mg/dL — ABNORMAL HIGH (ref 70–99)

## 2020-10-03 LAB — BASIC METABOLIC PANEL
Anion gap: 11 (ref 5–15)
BUN: 88 mg/dL — ABNORMAL HIGH (ref 8–23)
CO2: 26 mmol/L (ref 22–32)
Calcium: 8.2 mg/dL — ABNORMAL LOW (ref 8.9–10.3)
Chloride: 105 mmol/L (ref 98–111)
Creatinine, Ser: 4.02 mg/dL — ABNORMAL HIGH (ref 0.44–1.00)
GFR, Estimated: 11 mL/min — ABNORMAL LOW (ref >60)
Glucose, Bld: 285 mg/dL — ABNORMAL HIGH (ref 70–99)
Potassium: 4.1 mmol/L (ref 3.5–5.1)
Sodium: 142 mmol/L (ref 135–145)

## 2020-10-03 LAB — URINALYSIS, ROUTINE W REFLEX MICROSCOPIC
Bilirubin Urine: NEGATIVE
Glucose, UA: >=500 mg/dL — AB
Ketones, ur: NEGATIVE mg/dL
Nitrite: NEGATIVE
Protein, ur: 100 mg/dL — AB
Specific Gravity, Urine: 1.011 (ref 1.005–1.030)
WBC, UA: >50 WBC/hpf — ABNORMAL HIGH (ref 0–5)
pH: 7 (ref 5.0–8.0)

## 2020-10-03 LAB — CBC
HCT: 35.3 % — ABNORMAL LOW (ref 36.0–46.0)
Hemoglobin: 11.6 g/dL — ABNORMAL LOW (ref 12.0–15.0)
MCH: 29.4 pg (ref 26.0–34.0)
MCHC: 32.9 g/dL (ref 30.0–36.0)
MCV: 89.4 fL (ref 80.0–100.0)
Platelets: 293 10*3/uL (ref 150–400)
RBC: 3.95 MIL/uL (ref 3.87–5.11)
RDW: 15.3 % (ref 11.5–15.5)
WBC: 7.4 10*3/uL (ref 4.0–10.5)
nRBC: 0 % (ref 0.0–0.2)

## 2020-10-03 LAB — SODIUM, URINE, RANDOM: Sodium, Ur: 91 mmol/L

## 2020-10-03 LAB — CREATININE, URINE, RANDOM: Creatinine, Urine: 33.17 mg/dL

## 2020-10-03 LAB — HEPARIN LEVEL (UNFRACTIONATED)
Heparin Unfractionated: <0.1 IU/mL — ABNORMAL LOW (ref 0.30–0.70)
Heparin Unfractionated: 0.15 IU/mL — ABNORMAL LOW (ref 0.30–0.70)

## 2020-10-03 LAB — MAGNESIUM: Magnesium: 2.2 mg/dL (ref 1.7–2.4)

## 2020-10-03 MED ORDER — CHLORHEXIDINE GLUCONATE 0.12 % MT SOLN
OROMUCOSAL | Status: AC
  Filled 2020-10-03: qty 15

## 2020-10-03 MED ORDER — ATORVASTATIN CALCIUM 80 MG PO TABS
80.0000 mg | ORAL_TABLET | Freq: Every day | ORAL | Status: DC
  Administered 2020-10-04 – 2020-10-09 (×6): 80 mg
  Filled 2020-10-03 (×6): qty 1

## 2020-10-03 MED ORDER — SODIUM CHLORIDE 0.9 % IV SOLN: 1.0000 g | INTRAVENOUS | Status: DC

## 2020-10-03 MED ORDER — ATORVASTATIN CALCIUM 40 MG PO TABS
40.0000 mg | ORAL_TABLET | Freq: Once | ORAL | Status: AC
  Administered 2020-10-03: 40 mg
  Filled 2020-10-03: qty 1

## 2020-10-03 MED ORDER — INSULIN DETEMIR 100 UNIT/ML ~~LOC~~ SOLN
10.0000 [IU] | Freq: Two times a day (BID) | SUBCUTANEOUS | Status: DC
  Administered 2020-10-03 – 2020-10-04 (×2): 10 [IU] via SUBCUTANEOUS
  Filled 2020-10-03 (×3): qty 0.1

## 2020-10-03 MED ORDER — CEFTRIAXONE SODIUM 1 G IJ SOLR
1.0000 g | INTRAMUSCULAR | Status: DC
  Administered 2020-10-03 – 2020-10-05 (×3): 1 g via INTRAVENOUS
  Filled 2020-10-03 (×3): qty 10

## 2020-10-03 NOTE — Progress Notes (Signed)
ANTICOAGULATION CONSULT NOTE - Follow Up Consult  Pharmacy Consult for Heparin Indication: Afib / new CVA  Allergies  Allergen Reactions   Ace Inhibitors Swelling    Mouth area swelling   Atorvastatin Diarrhea and Other (See Comments)    Severe diarrhea   Benicar [Olmesartan] Swelling and Other (See Comments)    Angioedema   Epinephrine Hcl Hives        Levaquin [Levofloxacin In D5w] Swelling and Other (See Comments)    Angioedema    Losartan Swelling    Patient Measurements: Height: 5\' 3"  (160 cm) Weight: 82.2 kg (181 lb 3.5 oz) IBW/kg (Calculated) : 52.4   Vital Signs: Temp: 97.8 F (36.6 C) (08/03 1110) Temp Source: Oral (08/03 1110) BP: 177/83 (08/03 1110) Pulse Rate: 76 (08/03 1110)  Labs: Recent Labs    10/01/20 0146 10/02/20 0244 10/03/20 0210 10/03/20 1228  HGB 10.6* 10.8* 11.6*  --   HCT 33.5* 34.0* 35.3*  --   PLT 298 296 293  --   HEPARINUNFRC  --   --  <0.10* 0.15*  CREATININE 3.98* 4.13* 4.02*  --      Estimated Creatinine Clearance: 13.2 mL/min (A) (by C-G formula based on SCr of 4.02 mg/dL (H)).   Assessment: 70 year old female to begin heparin for Afib and new CVA 7/28 with thrombectomy 7/29  Heparin level still subtherapeutic at 0.15. We will increase rate and check another level.   Goal of Therapy:  Heparin level 0.3 to 0.5 Monitor platelets by anticoagulation protocol: Yes   Plan:  Increase heparin drip to 1150 units / hr Check 8 hr HL Daily heparin level, CBC  Onnie Boer, PharmD, BCIDP, AAHIVP, CPP Infectious Disease Pharmacist 10/03/2020 1:56 PM

## 2020-10-03 NOTE — TOC Progression Note (Signed)
Transition of Care [facility]) - Progression Note    Patient Details  Name: Kathleen [name] MRN: [id number] Date of Birth: [birth year]  Transition of Care [facility]) CM/SW [place], [place] Phone Number: 10/03/2020, 4:09 PM  Clinical Narrative:   CSW met with patient's sister, Horris Latino, at bedside to discuss SNF placement. Horris Latino expressed interest in pursuing placement near her home in [place], as it will be easier for her to assume care of her sister if she is closer. Horris Latino discussed being realistic in that she does not know if the patient will ever get home, so if she is going to be long term in a SNF she would like her closer to her so she can check on her. Horris Latino also expressed interest in a palliative consult, as she wants to ensure that no matter what happens that her sister is kept comfortable. CSW reached out to [place], and spoke with Santiago Glad. Santiago Glad indicated that they do have beds available and would look at the referral. CSW sent referral to Santiago Glad for review, and will follow up with her if they can offer.     Expected Discharge Plan: [place] Barriers to Discharge: Continued Medical Work up  Expected Discharge Plan and Services Expected Discharge Plan: [place] In-house Referral: Clinical Social Work   Post Acute Care Choice: [place] Living arrangements for the past 2 months: Single Family Home                                       Social Determinants of Health (SDOH) Interventions    Readmission Risk Interventions No flowsheet data found.

## 2020-10-03 NOTE — Progress Notes (Signed)
Inpatient Diabetes Program Recommendations  AACE/ADA: New Consensus Statement on Inpatient Glycemic Control   Target Ranges:  Prepandial:   less than 140 mg/dL      Peak postprandial:   less than 180 mg/dL (1-2 hours)      Critically ill patients:  140 - 180 mg/dL  Results for CARLISSA, PESOLA (MRN [id number]) as of 10/03/2020 10:45  Ref. Range 10/02/2020 07:31 10/02/2020 11:20 10/02/2020 15:06 10/02/2020 20:13 10/02/2020 23:27 10/03/2020 03:23 10/03/2020 08:27  Glucose-Capillary Latest Ref Range: 70 - 99 mg/dL 85 112 (H) 196 (H) 260 (H) 315 (H) 253 (H) 243 (H)   Review of Glycemic Control  Current orders for Inpatient glycemic control: Levemir 10 units BID, Novolog 0-20 units Q4H; Osmolite @ 45 ml/hr  Inpatient Diabetes Program Recommendations:    Insulin: Please consider ordering Novolog 4 units Q4H for tube feeding coverage. If tube feeding is stopped or held then Novolog tube feeding coverage should also be stopped or held.  Thanks, Barnie Alderman, RN, MSN, CDE Diabetes Coordinator Inpatient Diabetes Program [phone] (Team Pager from 8am to 5pm)

## 2020-10-03 NOTE — Consult Note (Signed)
Nephrology Consult   Requesting provider: Flora Lipps, MD Service requesting consult: Hospitalist Reason for consult: AKI on CKD IV   Assessment/Recommendations: Kathleen [name] is a/an 70 y.o. female with a past medical history KD IV, DM2, CHF, HTN, HLD, OSA, obesity who presents with CVA c/b AKI  Non-Oliguric AKI improving: BL 2-2.6. Likely multifactorial with contrast use, UTI, and possible ATN contributing. BP has fluctuated possibly leading to some degree of ATN. She appears euvolemic to me and Crt is improving -Can drink to thirst -Treat UTI as below -No diuretics for now -Continue to monitor daily Cr, Dose meds for GFR -Monitor Daily I/Os, Daily weight  -Maintain MAP>65 for optimal renal perfusion.  -Avoid nephrotoxic medications including NSAIDs and Vanc/Zosyn combo -Bladder scan to check for retention -Consider RUS if Crt worsens tomorrow -Currently no indication for HD  Acute hypoxic respiratory failure: Overall improved.  No pulmonary crackles but does have coarse upper airway sounds.  Continue supplemental oxygen per primary team.  Consider repeat chest x-ray given previous concern for pneumonia.  UTI: Extensive pyuria on urinalysis.  Obtain urine culture and start ceftriaxone 1 g daily plan for 5 days.  Can switch to oral when the patient is ready to go from the hospital.  Hypertension: Continue carvedilol and amlodipine.   CVA: Continue therapy and management per primary team and neurology.  Uncontrolled Diabetes Mellitus Type 2 with Hyperglycemia: Management per primary team  Atrial fibrillation: On heparin infusion.  Management per primary   Recommendations conveyed to primary service.    [facility] 10/03/2020 2:40 PM   _____________________________________________________________________________________ CC: Stroke  History of Present Illness: Kathleen [name] is a/an 70 y.o. female with a past medical history of CKD IV,  DM2, CHF, HTN, HLD, OSA, obesity who presents with CVA.  The patient is aphasic and most of the history was obtained per chart review.  The patient was able to shake her head yes or no to certain questions.  Initially presented on 7/28 for code stroke.  She had acute right hemiplegia and facial droop as well as abnormal gaze at home.  She was taken to IR for thrombectomy with successful revascularization of her occlusion.  After this the patient initially improved but the next day had worsening mentation and concern that she was not able to protect her airway.  She did require intubation during this hospitalization but was later placed on BiPAP and oxygen requirement has improved overall.  It was thought that she was having hypoxia because of OSA and decreased mentation causing hypoventilation.  With time her ventilation did improve and she was moved to nasal cannula.  Chest x-ray on 729 demonstrated signs concerning for pneumonia without significant pulmonary edema.  It appears that her baseline creatinine is around 2.6 but hard to say definitively as it has fluctuated.  Creatinine rose from 2.6-3.3 on 7/30.  Creatinine has slowly risen up until 8/2 when he reached 4.1.  Creatinine improved to 4 today.  Urine output also higher over the past 24 hours after receiving IV Lasix.  Urinalysis today demonstrates extensive pyuria.  Patient does not have any dysuria that she knows of.   Medications:  Current Facility-Administered Medications  Medication Dose Route Frequency Provider Last Rate Last Admin   acetaminophen (TYLENOL) tablet 650 mg  650 mg Oral Q4H PRN Kerney Elbe, MD       Or   acetaminophen (TYLENOL) 160 MG/5ML solution 650 mg  650 mg Per Tube Q4H PRN Lindzen,  Randall Hiss, MD   650 mg at 10/02/20 2036   Or   acetaminophen (TYLENOL) suppository 650 mg  650 mg Rectal Q4H PRN Kerney Elbe, MD       amLODipine (NORVASC) tablet 10 mg  10 mg Per Tube Daily Garvin Fila, MD   10 mg at 10/03/20 0900    [START ON 10/04/2020] atorvastatin (LIPITOR) tablet 80 mg  80 mg Per Tube Daily Pham, Minh Q, RPH-CPP       carvedilol (COREG) tablet 6.25 mg  6.25 mg Per Tube BID WC Jacky Kindle, MD   6.25 mg at 10/03/20 0810   chlorhexidine (PERIDEX) 0.12 % solution 15 mL  15 mL Mouth Rinse BID Kerney Elbe, MD   15 mL at 10/03/20 0900   Chlorhexidine Gluconate Cloth 2 % PADS 6 each  6 each Topical Daily Amie Portland, MD   6 each at 10/03/20 0900   feeding supplement (OSMOLITE 1.5 CAL) liquid 1,000 mL  1,000 mL Per Tube Continuous Rosalin Hawking, MD 45 mL/hr at 10/02/20 1511 1,000 mL at 10/02/20 1511   feeding supplement (PROSource TF) liquid 90 mL  90 mL Per Tube BID Rosalin Hawking, MD   90 mL at 10/03/20 0859   heparin ADULT infusion 100 units/mL (25000 units/270mL)  1,150 Units/hr Intravenous Continuous Pham, Lonna Duval Q, RPH-CPP 10 mL/hr at 10/03/20 0311 1,000 Units/hr at 10/03/20 4627   insulin aspart (novoLOG) injection 0-20 Units  0-20 Units Subcutaneous Q4H Kerney Elbe, MD   11 Units at 10/03/20 1111   insulin detemir (LEVEMIR) injection 10 Units  10 Units Subcutaneous BID Pokhrel, Laxman, MD       MEDLINE mouth rinse  15 mL Mouth Rinse q12n4p Kerney Elbe, MD   15 mL at 10/03/20 1108   methimazole (TAPAZOLE) tablet 5 mg  5 mg Per Tube Daily Rosalin Hawking, MD   5 mg at 10/03/20 0858   pantoprazole sodium (PROTONIX) 40 mg/20 mL oral suspension 40 mg  40 mg Per Tube QHS Dorene Grebe, MD   40 mg at 10/02/20 2036   polyethylene glycol (MIRALAX / GLYCOLAX) packet 17 g  17 g Per Tube Daily Spero Geralds, MD   17 g at 10/02/20 1200   polyvinyl alcohol (LIQUIFILM TEARS) 1.4 % ophthalmic solution 1 drop  1 drop Both Eyes PRN Heloise Purpura, RPH       senna-docusate (Senokot-S) tablet 1 tablet  1 tablet Per Tube QHS PRN Rosalin Hawking, MD         ALLERGIES Ace inhibitors, Atorvastatin, Benicar [olmesartan], Epinephrine hcl, Levaquin [levofloxacin in d5w], and Losartan  MEDICAL HISTORY Past Medical History:   Diagnosis Date   Carotid artery plaque    duplex 07/2020 1-39% BICA   CKD (chronic kidney disease), stage IV (HCC)    Complication of anesthesia    pt states stopped breathing when having tonsillectomy at age 81; pt states has had anesthesia since then without difficulty    Diabetes mellitus    Glaucoma    Heart disease    Hyperlipidemia    Hypertension    Hyperthyroidism    Menopause    Mixed hyperlipidemia 09/22/2013   Morbid obesity ([place])    Multinodular goiter    AND HYPERTHYROIDISM    Obesity (BMI 30-39.9) 04/30/2015   OSA on CPAP    Proliferative retinopathy    moderate, Dr. Posey Pronto   Proteinuria    Dr. Hassell Done   Pulmonary hypertension ([place])    PVC's (premature ventricular contractions)  Right-sided heart failure (HCC)    Sinus bradycardia    Trigger finger    Dr. Rip Harbour     SOCIAL HISTORY Social History   Socioeconomic History   Marital status: Single    Spouse name: Not on file   Number of children: Not on file   Years of education: Not on file   Highest education level: Not on file  Occupational History   Not on file  Tobacco Use   Smoking status: Former    Packs/day: 1.00    Years: 30.00    Pack years: 30.00    Types: Cigarettes    Quit date: 03/04/2007    Years since quitting: 13.5   Smokeless tobacco: Never  Vaping Use   Vaping Use: Never used  Substance and Sexual Activity   Alcohol use: Yes    Alcohol/week: 0.0 standard drinks    Comment: occas   Drug use: No   Sexual activity: Not on file  Other Topics Concern   Not on file  Social History Narrative   Not on file   Social Determinants of Health   Financial Resource Strain: Not on file  Food Insecurity: Not on file  Transportation Needs: Not on file  Physical Activity: Not on file  Stress: Not on file  Social Connections: Not on file  Intimate Partner Violence: Not on file     FAMILY HISTORY Family History  Problem Relation Age of Onset   Heart attack Father       Review of  Systems: 12 systems reviewed Otherwise as per HPI, all other systems reviewed and negative  Physical Exam: Vitals:   10/03/20 0830 10/03/20 1110  BP: (!) 139/101 (!) 177/83  Pulse: 72 76  Resp: 20 18  Temp: 97.7 F (36.5 C) 97.8 F (36.6 C)  SpO2:  95%   No intake/output data recorded.  Intake/Output Summary (Last 24 hours) at 10/03/2020 1440 Last data filed at 10/03/2020 0400 Gross per 24 hour  Intake 791.14 ml  Output 1275 ml  Net -483.86 ml   General: well-appearing, no acute distress HEENT: anicteric sclera, oropharynx clear without lesions CV: Normal rate, no murmurs Lungs: Bilateral upper airway sounds, with no crackles in the periphery, normal work of breathing Abd: soft, non-tender, non-distended Skin: no visible lesions or rashes Psych: alert, engaged, appropriate mood and affect Musculoskeletal: no obvious deformities Neuro: Aphasic, able to nod her head yes or no to questions  Test Results Reviewed Lab Results  Component Value Date   NA 142 10/03/2020   K 4.1 10/03/2020   CL 105 10/03/2020   CO2 26 10/03/2020   BUN 88 (H) 10/03/2020   CREATININE 4.02 (H) 10/03/2020   CALCIUM 8.2 (L) 10/03/2020   ALBUMIN 3.2 (L) 09/27/2020   PHOS 4.3 09/29/2020     I have reviewed all relevant outside healthcare records related to the patient's current hospitalization

## 2020-10-03 NOTE — Care Management Important Message (Signed)
Important Message  Patient Details  Name: Kathleen [name] MRN: [id number] Date of Birth: Nov 30, 1950   Medicare Important Message Given:  Yes     Keyoni Lapinski 10/03/2020, 2:10 PM

## 2020-10-03 NOTE — NC FL2 (Addendum)
[place] LEVEL OF CARE SCREENING TOOL     IDENTIFICATION  Patient Name: Kathleen [name] Birthdate: 31-Jan-1951 Sex: female Admission Date (Current Location): 09/27/2020  [facility] and [place] Number:  Herbalist and Address:  [facility] [place], [place] [address] [place], [place] [address]      Provider Number: [id number]  Attending Physician Name and Address:  Flora Lipps, MD  Relative Name and Phone Number:  Horris Latino, sister, [phone]    Current Level of Care: Hospital Recommended Level of Care: [place] Prior Approval Number:    Date Approved/Denied:   PASRR Number:  [id number]  Discharge Plan: SNF    Current Diagnoses: Patient Active Problem List   Diagnosis Date Noted   Pressure injury of skin 09/28/2020   Stroke (cerebrum) ([place]) 09/27/2020   Uncontrolled type 2 diabetes mellitus with hypoglycemia without coma ([place]) 08/26/2017   Hypertensive heart disease 06/23/2016   CRI (chronic renal insufficiency), stage 3 (moderate) ([place]) 06/23/2016   Left rib fracture 05/25/2016   Pneumothorax on left 05/23/2016   PVC's (premature ventricular contractions) 11/28/2015   OSA (obstructive sleep apnea) 04/30/2015   Obesity (BMI 30-39.9) 04/30/2015   Status post gastric banding 02/27/2015   Varicose veins of leg with complications 56/31/4970   Metatarsal deformity 10/04/2013   Nonspecific abnormal unspecified cardiovascular function study 09/29/2013   Mixed hyperlipidemia 09/22/2013   Tenosynovitis of foot and ankle 09/14/2013   Pronation deformity of ankle, acquired 09/14/2013   Deformity of metatarsal bone of right foot 09/14/2013   Deformity of metatarsal bone of left foot 09/14/2013   Proteinuria 07/28/2012   DYSPNEA 01/11/2008   HYPERTHYROIDISM 05/10/2007   UNSPECIFIED DISORDER OF THYROID 05/06/2007   DIABETES, TYPE 2 03/22/2007   HYPERLIPIDEMIA 03/22/2007   Essential hypertension 03/22/2007   C O P D  03/22/2007   MEDIASTINAL ADENOPATHY CASTLEMANS D 03/22/2007   OXYGEN-USE OF SUPPLEMENTAL 03/22/2007    Orientation RESPIRATION BLADDER Height & Weight     Self, Place, Time, Situation  O2 (2L nasal cannula) Incontinent, External catheter Weight: 181 lb 3.5 oz (82.2 kg) Height:  5\' 3"  (160 cm)  BEHAVIORAL SYMPTOMS/MOOD NEUROLOGICAL BOWEL NUTRITION STATUS      Incontinent Diet (Please see DC Summary)  AMBULATORY STATUS COMMUNICATION OF NEEDS Skin   Extensive Assist Verbally Normal                       Personal Care Assistance Level of Assistance  Bathing, Feeding, Dressing Bathing Assistance: Maximum assistance Feeding assistance: Maximum assistance Dressing Assistance: Maximum assistance     Functional Limitations Info  Sight Sight Info: Impaired        SPECIAL CARE FACTORS FREQUENCY  PT (By licensed PT), OT (By licensed OT), Speech therapy     PT Frequency: 5x/week OT Frequency: 5x/week     Speech Therapy Frequency: 3x/week      Contractures Contractures Info: Not present    Additional Factors Info  Code Status, Allergies, Insulin Sliding Scale Code Status Info: DNR Allergies Info: Ace Inhibitors, Atorvastatin, Benicar (Olmesartan), Epinephrine Hcl, Levaquin (Levofloxacin In D5w), Losartan   Insulin Sliding Scale Info: See DC Summary       Current Medications (10/03/2020):  This is the current hospital active medication list Current Facility-Administered Medications  Medication Dose Route Frequency Provider Last Rate Last Admin   acetaminophen (TYLENOL) tablet 650 mg  650 mg Oral Q4H PRN Kerney Elbe, MD       Or  acetaminophen (TYLENOL) 160 MG/5ML solution 650 mg  650 mg Per Tube Q4H PRN Kerney Elbe, MD   650 mg at 10/02/20 2036   Or   acetaminophen (TYLENOL) suppository 650 mg  650 mg Rectal Q4H PRN Kerney Elbe, MD       amLODipine (NORVASC) tablet 10 mg  10 mg Per Tube Daily Garvin Fila, MD   10 mg at 10/03/20 0900   atorvastatin (LIPITOR)  tablet 40 mg  40 mg Per Tube Daily Dorene Grebe, MD   40 mg at 10/03/20 0900   carvedilol (COREG) tablet 6.25 mg  6.25 mg Per Tube BID WC Jacky Kindle, MD   6.25 mg at 10/03/20 0810   chlorhexidine (PERIDEX) 0.12 % solution 15 mL  15 mL Mouth Rinse BID Kerney Elbe, MD   15 mL at 10/03/20 0900   Chlorhexidine Gluconate Cloth 2 % PADS 6 each  6 each Topical Daily Amie Portland, MD   6 each at 10/03/20 0900   feeding supplement (OSMOLITE 1.5 CAL) liquid 1,000 mL  1,000 mL Per Tube Continuous Rosalin Hawking, MD 45 mL/hr at 10/02/20 1511 1,000 mL at 10/02/20 1511   feeding supplement (PROSource TF) liquid 90 mL  90 mL Per Tube BID Rosalin Hawking, MD   90 mL at 10/03/20 0859   heparin ADULT infusion 100 units/mL (25000 units/282mL)  1,000 Units/hr Intravenous Continuous Garvin Fila, MD 10 mL/hr at 10/03/20 0311 1,000 Units/hr at 10/03/20 0311   insulin aspart (novoLOG) injection 0-20 Units  0-20 Units Subcutaneous Q4H Kerney Elbe, MD   7 Units at 10/03/20 0859   insulin detemir (LEVEMIR) injection 8 Units  8 Units Subcutaneous BID Jacky Kindle, MD   8 Units at 10/03/20 0858   MEDLINE mouth rinse  15 mL Mouth Rinse q12n4p Kerney Elbe, MD   15 mL at 10/02/20 1516   methimazole (TAPAZOLE) tablet 5 mg  5 mg Per Tube Daily Rosalin Hawking, MD   5 mg at 10/03/20 0858   pantoprazole sodium (PROTONIX) 40 mg/20 mL oral suspension 40 mg  40 mg Per Tube QHS Dorene Grebe, MD   40 mg at 10/02/20 2036   polyethylene glycol (MIRALAX / GLYCOLAX) packet 17 g  17 g Per Tube Daily Spero Geralds, MD   17 g at 10/02/20 1200   polyvinyl alcohol (LIQUIFILM TEARS) 1.4 % ophthalmic solution 1 drop  1 drop Both Eyes PRN Heloise Purpura, RPH       senna-docusate (Senokot-S) tablet 1 tablet  1 tablet Per Tube QHS PRN Rosalin Hawking, MD         Discharge Medications: Please see discharge summary for a list of discharge medications.  Relevant Imaging Results:  Relevant Lab Results:   Additional Information SSN:  [id number]. Moderna vaccines 04/16/19, 05/14/19.  Benard Halsted, LCSW

## 2020-10-03 NOTE — Progress Notes (Addendum)
PROGRESS NOTE  Kathleen [name] RCV:[id number] DOB: [birth year] DOA: 09/27/2020 PCP: Kristen Loader, FNP   LOS: 6 days   Brief narrative:  Kathleen [name] is a 70 y.o. female with past medical history of CKD stage IV, diabetes mellitus type 2, chronic diastolic heart failure, hypertension, hyperlipidemia, OSA on CPAP, morbid obesity presented to the hospital as a code stroke.  Patient presented with acute right hemiplegia with right facial droop and leftward gaze. She was admitted under stroke team status post thrombectomy of left M1 occlusion. Patient was extubated post procedure and able to follow commands but subsequently 729 afternoon she had decreased mentation and labored breathing and was unable to protect her airway.  Critical care was consulted and patient underwent intubation and mechanical ventilation in ICU admission.  Subsequently patient was extubated and was considered stable for transfer out of the ICU.  Assessment/Plan:  Principal Problem:   Stroke (cerebrum) (HCC) Active Problems:   Essential hypertension   OSA (obstructive sleep apnea)   Obesity (BMI 30-39.9)   CRI (chronic renal insufficiency), stage 3 (moderate) (HCC)  Acute left MCA stroke s/p thrombectomy dysphagia. Thought to be cardioembolic due to new onset atrial fibrillation.  Neurology saw the patient.  At this time patient is on heparin drip.  Speech therapy has seen the patient and advised dysphagia 1 diet.  Still on cortrak tube tube.  Acute hypoxemic respiratory failure/Hx of OSA on CPAP Initially intubated for airway protection.  Subsequently was on BiPAP.  This has been weaned to nasal cannula at this time.  Continue pulmonary hygiene.  Avoid oversedation.   Oliguric acute kidney injury on CKD stage 3b Baseline creatinine around 2.6.  Creatinine has trended up to 4.02.  Patient received 100 mg of IV Lasix yesterday with 1275 mill output but patient is still positive for 688 1 mL.  We will continue to  monitor electrolytes.  We will patient Foley catheter today for accurate intake and output charting.  Significant elevation of creatinine.  We will get nephrology consultation due to polyuria with possible intrinsic cause as per FeNa.  Hyperthyroidism.  Continue methimazole.    Chronic diastolic CHF Mildly oliguric at this time.  We will get nephrology evaluation.  Hypertension/hyperlipidemia On amlodipine and Coreg.  We will continue to monitor closely.  Proximal A. fib, new onset 2D echocardiogram showed EF 55-60% with grade II diastolic dysfunction  12/2583.Continue Norvasc, Lipitor, Coreg,   Diabetes type 2 Continue sliding scale insulin, Accu-Cheks diabetic diet.  We will hold oral hypoglycemic agents.  Goals of care At this time patient is extubated.  Patient's sister [name] at bedside. she indicated that patient would not want  to be on long-term PEG tube feeding.  Currently on cortrak tube tube feeding with Osmolite.  She was interested in discussing palliative care.  Palliative care has been consulted.    DVT prophylaxis: SCD's Start: 09/27/20 1528   Code Status: DNR  Family Communication: Spoke with the patient's sister [name] at bedside at length..    Status is: Inpatient  Remains inpatient appropriate because:IV treatments appropriate due to intensity of illness or inability to take PO and Inpatient level of care appropriate due to severity of illness  Dispo: The patient is from: Home              Anticipated d/c is to: SNF              Patient currently is not medically stable to d/c.   Difficult  to place patient No  Consultants: Critical care Neurology Palliative care Nephrology will consult.  Procedures: Intubation and mechanical ventilation Thrombectomy by IR on 7/29. BiPAP placement.  Anti-infectives:  None  Anti-infectives (From admission, onward)    None      Subjective: Today, patient was seen and examined at bedside.  Patient  denies  pain.  Patient sister at bedside.  Patient staff reported elevated blood glucose levels  Objective: Vitals:   10/03/20 0830 10/03/20 1110  BP: (!) 139/101 (!) 177/83  Pulse: 72 76  Resp: 20 18  Temp: 97.7 F (36.5 C) 97.8 F (36.6 C)  SpO2:  95%    Intake/Output Summary (Last 24 hours) at 10/03/2020 1354 Last data filed at 10/03/2020 0400 Gross per 24 hour  Intake 921.14 ml  Output 1275 ml  Net -353.86 ml   Filed Weights   09/30/20 0400 10/01/20 0434 10/02/20 0341  Weight: 83.1 kg 83.4 kg 82.2 kg   Body mass index is 32.1 kg/m.   Physical Exam: GENERAL: Patient is alert awake, Chronically ill, sitting in chair, on nasal cannula oxygen, HENT: No scleral pallor or icterus. Pupils equally reactive to light. Oral mucosa is moist, cortrak tube tube in place. NECK: is supple, no gross swelling noted. CHEST: Decreased breath sounds bilaterally. CVS: S1 and S2 heard, no murmur. Regular rate and rhythm.  ABDOMEN: Soft, non-tender, bowel sounds are present. EXTREMITIES: No edema. CNS: Right facial droop.  Right-sided weakness more than the left. SKIN: warm and dry without rashes.  Data Review: I have personally reviewed the following laboratory data and studies,  CBC: Recent Labs  Lab 09/27/20 1356 09/27/20 1401 09/29/20 0434 09/30/20 0133 10/01/20 0146 10/02/20 0244 10/03/20 0210  WBC 18.2*   < > 12.1* 9.9 9.8 9.4 7.4  NEUTROABS 15.9*  --   --   --   --   --   --   HGB 12.2   < > 9.9* 10.5* 10.6* 10.8* 11.6*  HCT 38.9   < > 31.1* 31.6* 33.5* 34.0* 35.3*  MCV 93.3   < > 91.2 89.0 89.8 91.2 89.4  PLT 343   < > 289 262 298 296 293   < > = values in this interval not displayed.   Basic Metabolic Panel: Recent Labs  Lab 09/28/20 1352 09/28/20 1745 09/29/20 0434 09/29/20 1639 09/30/20 0133 10/01/20 0146 10/02/20 0244 10/03/20 0210  NA  --   --  138  --  140 142 142 142  K  --   --  4.4  --  4.6 4.5 4.3 4.1  CL  --   --  101  --  104 106 106 105  CO2  --    --  22  --  24 26 25 26   GLUCOSE  --   --  280*  --  229* 103* 132* 285*  BUN  --   --  51*  --  66* 83* 89* 88*  CREATININE  --   --  3.32*  --  3.67* 3.98* 4.13* 4.02*  CALCIUM  --   --  8.1*  --  7.9* 8.0* 8.1* 8.2*  MG 2.0 1.9 2.0 2.0 2.1  --   --  2.2  PHOS 5.4* 5.6* 5.6* 4.3  --   --   --   --    Liver Function Tests: Recent Labs  Lab 09/27/20 1356  AST 38  ALT 13  ALKPHOS 69  BILITOT 2.9*  PROT 6.2*  ALBUMIN 3.2*  No results for input(s): LIPASE, AMYLASE in the last 168 hours. No results for input(s): AMMONIA in the last 168 hours. Cardiac Enzymes: No results for input(s): CKTOTAL, CKMB, CKMBINDEX, TROPONINI in the last 168 hours. BNP (last 3 results) No results for input(s): BNP in the last 8760 hours.  ProBNP (last 3 results) No results for input(s): PROBNP in the last 8760 hours.  CBG: Recent Labs  Lab 10/02/20 2013 10/02/20 2327 10/03/20 0323 10/03/20 0827 10/03/20 1108  GLUCAP 260* 315* 253* 243* 260*   Recent Results (from the past 240 hour(s))  Resp Panel by RT-PCR (Flu A&B, Covid) Nasopharyngeal Swab     Status: None   Collection Time: 09/27/20  1:56 PM   Specimen: Nasopharyngeal Swab; Nasopharyngeal(NP) swabs in vial transport medium  Result Value Ref Range Status   SARS Coronavirus 2 by RT PCR NEGATIVE NEGATIVE Final    Comment: (NOTE) SARS-CoV-2 target nucleic acids are NOT DETECTED.  The SARS-CoV-2 RNA is generally detectable in upper respiratory specimens during the acute phase of infection. The lowest concentration of SARS-CoV-2 viral copies this assay can detect is 138 copies/mL. A negative result does not preclude SARS-Cov-2 infection and should not be used as the sole basis for treatment or other patient management decisions. A negative result may occur with  improper specimen collection/handling, submission of specimen other than nasopharyngeal swab, presence of viral mutation(s) within the areas targeted by this assay, and inadequate  number of viral copies(<138 copies/mL). A negative result must be combined with clinical observations, patient history, and epidemiological information. The expected result is Negative.  Fact Sheet for Patients:  EntrepreneurPulse.com.au  Fact Sheet for Healthcare Providers:  IncredibleEmployment.be  This test is no t yet approved or cleared by the [place] FDA and  has been authorized for detection and/or diagnosis of SARS-CoV-2 by FDA under an Emergency Use Authorization (EUA). This EUA will remain  in effect (meaning this test can be used) for the duration of the COVID-19 declaration under Section 564(b)(1) of the Act, 21 U.S.C.section 360bbb-3(b)(1), unless the authorization is terminated  or revoked sooner.       Influenza A by PCR NEGATIVE NEGATIVE Final   Influenza B by PCR NEGATIVE NEGATIVE Final    Comment: (NOTE) The Xpert Xpress SARS-CoV-2/FLU/RSV plus assay is intended as an aid in the diagnosis of influenza from Nasopharyngeal swab specimens and should not be used as a sole basis for treatment. Nasal washings and aspirates are unacceptable for Xpert Xpress SARS-CoV-2/FLU/RSV testing.  Fact Sheet for Patients: EntrepreneurPulse.com.au  Fact Sheet for Healthcare Providers: IncredibleEmployment.be  This test is not yet approved or cleared by the [place] FDA and has been authorized for detection and/or diagnosis of SARS-CoV-2 by FDA under an Emergency Use Authorization (EUA). This EUA will remain in effect (meaning this test can be used) for the duration of the COVID-19 declaration under Section 564(b)(1) of the Act, 21 U.S.C. section 360bbb-3(b)(1), unless the authorization is terminated or revoked.  Performed at [facility], [place] [address], [place], [place] [address]   MRSA Next Gen by PCR, Nasal     Status: None   Collection Time: 09/27/20  8:31 PM   Specimen: Nasal  Mucosa; Nasal Swab  Result Value Ref Range Status   MRSA by PCR Next Gen NOT DETECTED NOT DETECTED Final    Comment: (NOTE) The GeneXpert MRSA Assay (FDA approved for NASAL specimens only), is one component of a comprehensive MRSA colonization surveillance program. It is not intended to  diagnose MRSA infection nor to guide or monitor treatment for MRSA infections. Test performance is not FDA approved in patients less than 54 years old. Performed at [facility], [place] [address] [place], [place] [address]      Studies: No results found.    Flora Lipps, MD  [facility] 10/03/2020  If 7PM-7AM, please contact night-coverage

## 2020-10-03 NOTE — Progress Notes (Signed)
  Speech Language Pathology Treatment: Dysphagia;Cognitive-Linquistic  Patient Details Name: Kathleen [name] MRN: [id number] DOB: 1950/09/20 Today's Date: 10/03/2020 Time: 1007-1219 SLP Time Calculation (min) (ACUTE ONLY): 33 min  Assessment / Plan / Recommendation Clinical Impression  Pt making good progress today, able to repeat a word with visual placement cues in context ("good"). More accurate Yes/No. Cues for use of gestures to communicate basic needs successful in 3/3 opportunities. Pt tolerating honey and puree teaspoons, ready to start an official diet with total assisted feeding. Discussed with RN and also precautions of a baseline dysphagia: pt was struggling with globus and regurgitation due to GI problems. Work up was in progress at time of stroke. Pt was mostly only consuming soup at the time of stroke with frequent regurgitation. Pt to eat small portions and remain upright for an hour after meals, HOB to stay at 30 degrees. RN will discuss PPI with MD.   HPI HPI: Pt is a 70 y.o. F who presents with right hemiplegia, facial droop and left gaze. Found to have distal left M1 occlusion s/p thrombectomy. Pt with new onset paroxysmal afib and acute respiratory insufficiencies with concern for ability to protect airway. Significant PMH: CKD stage 4, DM, CHF, morbid obesity, OSA on CPAP.      SLP Plan  Continue with current plan of care       Recommendations  Diet recommendations: Dysphagia 1 (puree);Honey-thick liquid Liquids provided via: Teaspoon Medication Administration: Whole meds with puree Supervision: Staff to assist with self feeding Compensations: Slow rate;Small sips/bites Postural Changes and/or Swallow Maneuvers: Upright 30-60 min after meal;Seated upright 90 degrees                Follow up Recommendations: Skilled Nursing facility SLP Visit Diagnosis: Dysphagia, oropharyngeal phase (R13.12) Plan: Continue with current plan of care       GO                Herbie Baltimore, MA [place] Pager [phone] Office [phone]  Lynann Beaver 10/03/2020, 1:53 PM

## 2020-10-03 NOTE — Plan of Care (Signed)

## 2020-10-03 NOTE — Progress Notes (Signed)
Patient ID: DAYSHA ASHMORE, female   DOB: Apr 30, 1950, 70 y.o.   MRN: [id number] Patient ID: ARIEANNA PRESSEY, female   DOB: 06/08/1950, 70 y.o.   MRN: [id number] STROKE TEAM PROGRESS NOTE   INTERVAL HISTORY Her sister is at the bedside.  Patient sitting up in bed.  She has shown improvement in her swallowing as per speech therapy and is now cleared for dysphagia 1 diet.  She however had previously lap band surgery and eats only a limited quantity with concerns as to whether she will be able to eat enough to maintain her nutritional status.  She still has a core track tube.   Vital signs stable.  Neurological exam unchanged   Vitals:   10/02/20 2326 10/03/20 0322 10/03/20 0830 10/03/20 1110  BP: 136/84 (!) 164/86 (!) 139/101 (!) 177/83  Pulse: 78 76 72 76  Resp: 17 20 20 18   Temp: 98.5 F (36.9 C) 98.4 F (36.9 C) 97.7 F (36.5 C) 97.8 F (36.6 C)  TempSrc:   Oral Oral  SpO2: 94% (!) 88%  95%  Weight:      Height:       CBC:  Recent Labs  Lab 09/27/20 1356 09/27/20 1401 10/02/20 0244 10/03/20 0210  WBC 18.2*   < > 9.4 7.4  NEUTROABS 15.9*  --   --   --   HGB 12.2   < > 10.8* 11.6*  HCT 38.9   < > 34.0* 35.3*  MCV 93.3   < > 91.2 89.4  PLT 343   < > 296 293   < > = values in this interval not displayed.   Basic Metabolic Panel:  Recent Labs  Lab 09/29/20 0434 09/29/20 1639 09/30/20 0133 10/01/20 0146 10/02/20 0244 10/03/20 0210  NA 138  --  140   < > 142 142  K 4.4  --  4.6   < > 4.3 4.1  CL 101  --  104   < > 106 105  CO2 22  --  24   < > 25 26  GLUCOSE 280*  --  229*   < > 132* 285*  BUN 51*  --  66*   < > 89* 88*  CREATININE 3.32*  --  3.67*   < > 4.13* 4.02*  CALCIUM 8.1*  --  7.9*   < > 8.1* 8.2*  MG 2.0 2.0 2.1  --   --  2.2  PHOS 5.6* 4.3  --   --   --   --    < > = values in this interval not displayed.   Lipid Panel:  Recent Labs  Lab 09/30/20 0133  CHOL 140  TRIG 63  HDL 50  CHOLHDL 2.8  VLDL 13  LDLCALC 77   HgbA1c:  Recent Labs  Lab  09/28/20 0245  HGBA1C 7.8*   Urine Drug Screen:  Recent Labs  Lab 09/28/20 0951  LABOPIA NONE DETECTED  COCAINSCRNUR NONE DETECTED  LABBENZ NONE DETECTED  AMPHETMU NONE DETECTED  THCU NONE DETECTED  LABBARB NONE DETECTED    Alcohol Level  Recent Labs  Lab 09/27/20 1356  ETH <10    IMAGING past 24 hours No results found.  PHYSICAL EXAM General: Obese elderly Caucasian lady.  On [place]. No acute distress. Appears comfortable. HENT:  in place. Head: Normocephalic.  Skin: no obvious lesions.    Neurological Examination Mental Status: More alert today, sitting up in bed.  Expressive greater than receptive aphasia nodded yes/no to  questions. She followed all commands.Non verbal. Did not name objects.  Cranial Nerves: Left gaze pref, but will look to right and cross midline. Right facial droop.  Motor:Tone and bulk:normal tone throughout; moving left side well . Right side weakness 4/5. Deep Tendon Reflexes: 2+ and symmetric throughout Plantars: Right: downgoing   Left: downgoing Cerebellar: no gross ataxia. Gait: unable due to aphasia  ASSESSMENT/PLAN Ms. SANDI TOWE is a 70 y.o. female acute right hemiplegia, right facial droop, leftward gaze and global aphasia. NIHSS 27 on admission. CTA showed distal Left M1 occlusion with favorable perfusion missmatich. She was outside of time window for IV tPA, but was emergently treated with thrombectomy 09/28/20 with TICI 3 restoration of flow Dr Earleen Newport.  Stroke:  left MCA infarct; cardioembolic secondary to Afib (new dx, not on [place])  Code Stroke CT head No acute abnormality. Small vessel disease. Atrophy. ASPECTS 7.   CTA head & neck distal left M1 or proximal M2 MCA in the anterior aspect of the left sylvian fissure (series 10, images 85 through 88) which correlates with the hyperdensity seen on same day noncontrast head CT and is concerning for a high-grade stenosis or occlusion. The remainder of the CTA is essentially  nondiagnostic in the head and neck due to non arterial timing. CT perfusion 82ml core in anterior LMCA. 61ml detected penumbra MRI  completed. Acute left MCA ischemic CVA. Petechial hemorrhage. No shift.  MRA revascularization of proximal MCA noted. 2D Echo completed. EF 60-65%. Left atrial dilatation. No shunt/no thrombus. LDL 91 HgbA1c 7.8 VTE prophylaxis - scds    Diet   DIET - DYS 1 Room service appropriate? Yes; Fluid consistency: Honey Thick   none  prior to admission, now on aspirin 325mg  Via cortrak  Therapy recommendations:  CIR recommended Disposition:  pending  Hypertension Home meds:  Aldactone, norvasc, coreg, chlorthalidone Stable Permissive hypertension (OK if < 220/120) but gradually normalize in 5-7 days Long-term BP goal normotensive  Hyperlipidemia Home meds:  lipitor 80mg , resumed in hospital LDL 91, goal < 70 Add   High intensity statin  Continue statin at discharge  Diabetes type II Uncontrolled Home meds:  Farxiga, levemir flexpen, novolog HgbA1c 7.8, goal < 7.0 CBGs Recent Labs    10/03/20 0323 10/03/20 0827 10/03/20 1108  GLUCAP 253* 243* 260*    SSI Sugars still elevated. Increase levemir insulin to 12 units BID, managed by CCM  Pulmonary: tolerated BiPaP  Other Stroke Risk Factors Advanced Age >/= 46  Former Cigarette smoker; quit 34yrs ago Obesity, Body mass index is 32.1 kg/m., BMI >/= 30 associated with increased stroke risk, recommend weight loss, diet and exercise as appropriate  CAD Carotid artery disease Obstructive sleep apnea, on CPAP at home- RT consulted for to add this for daytime or nighttime sleeping.  Right side heart failure- stopped IVF and ordred Lasix 20mg . Foley catheter for strict I&Os.  New dx of Afib- 12lead ordered to confirm-not done yet.   Other Active Problems Morbid obesity- s/p gastric banding in 2010 with revision in 2020 Hyperthyroidism- on methimazole at home Dysphasia- d/t stroke-  Cortrak for  nutrition and meds Atrial fibrillation - newly diagnosed - ASA 325 mg daily - anticoagulate when appropriate from stroke standpoint. CKD stage IV - creatinine - 3.98 Anemia - Hgb - 12.2->10.5->10.6 Code status - DNR NPO [facility] day # 6 Plan start dysphagia 1 diet and patient encouraged to eat.  She may need calorie count and supplemental feeds through core  track tube.  She will not be able to transferred for rehab to skilled nursing facility unless core track tube comes out and she can eat adequately..  Continue ongoing therapies..  Discussed with patient and sister at the bedside and speech therapist and answered questions.  Continue iv hepairin drip for anticoagulation for her new AFIB .  And will switch to Eliquis once patient is able to eat adequately and it is clear that she does not need a PEG tube.  Nephrology consult for worsening renal failure.  Appreciate medical hospitalist team to resume her care for medical management.  Discussed with Dr. Talmage Nap.  Greater than 50% time during this 35-minute visit was spent on counseling and coordination of care about her stroke and atrial fibrillation and anticoagulation and dysphagia and answering questions   Antony Contras, MD

## 2020-10-03 NOTE — Progress Notes (Signed)
ANTICOAGULATION CONSULT NOTE Pharmacy Consult for Heparin Indication: Afib / new CVA  Allergies  Allergen Reactions   Ace Inhibitors Swelling    Mouth area swelling   Atorvastatin Diarrhea and Other (See Comments)    Severe diarrhea   Benicar [Olmesartan] Swelling and Other (See Comments)    Angioedema   Epinephrine Hcl Hives        Levaquin [Levofloxacin In D5w] Swelling and Other (See Comments)    Angioedema    Losartan Swelling    Patient Measurements: Height: 5\' 3"  (160 cm) Weight: 82.2 kg (181 lb 3.5 oz) IBW/kg (Calculated) : 52.4   Vital Signs: Temp: 98.5 F (36.9 C) (08/02 2326) Temp Source: Oral (08/02 1947) BP: 136/84 (08/02 2326) Pulse Rate: 78 (08/02 2326)  Labs: Recent Labs    10/01/20 0146 10/02/20 0244 10/03/20 0210  HGB 10.6* 10.8* 11.6*  HCT 33.5* 34.0* 35.3*  PLT 298 296 293  HEPARINUNFRC  --   --  <0.10*  CREATININE 3.98* 4.13*  --      Estimated Creatinine Clearance: 12.9 mL/min (A) (by C-G formula based on SCr of 4.13 mg/dL (H)).   Assessment: 70 year old female with Afib s/p CVA and thrombectomy 7/29   Goal of Therapy:  Heparin level 0.3 to 0.5 units/mL Monitor platelets by anticoagulation protocol: Yes   Plan:  Increase Heparin 1000 units/hr Check heparin level in 8 hours.  Phillis Knack, PharmD, BCPS  10/03/2020,2:56 AM

## 2020-10-04 DIAGNOSIS — R1310 Dysphagia, unspecified: Secondary | ICD-10-CM

## 2020-10-04 DIAGNOSIS — E669 Obesity, unspecified: Secondary | ICD-10-CM | POA: Diagnosis not present

## 2020-10-04 DIAGNOSIS — R638 Other symptoms and signs concerning food and fluid intake: Secondary | ICD-10-CM | POA: Diagnosis not present

## 2020-10-04 DIAGNOSIS — Z4659 Encounter for fitting and adjustment of other gastrointestinal appliance and device: Secondary | ICD-10-CM

## 2020-10-04 DIAGNOSIS — I63412 Cerebral infarction due to embolism of left middle cerebral artery: Secondary | ICD-10-CM | POA: Diagnosis not present

## 2020-10-04 DIAGNOSIS — Z7189 Other specified counseling: Secondary | ICD-10-CM | POA: Diagnosis not present

## 2020-10-04 DIAGNOSIS — N183 Chronic kidney disease, stage 3 unspecified: Secondary | ICD-10-CM | POA: Diagnosis not present

## 2020-10-04 DIAGNOSIS — I10 Essential (primary) hypertension: Secondary | ICD-10-CM | POA: Diagnosis not present

## 2020-10-04 DIAGNOSIS — Z515 Encounter for palliative care: Secondary | ICD-10-CM | POA: Diagnosis not present

## 2020-10-04 DIAGNOSIS — I639 Cerebral infarction, unspecified: Secondary | ICD-10-CM | POA: Diagnosis not present

## 2020-10-04 LAB — COMPREHENSIVE METABOLIC PANEL
ALT: 9 U/L (ref 0–44)
AST: 18 U/L (ref 15–41)
Albumin: 2.1 g/dL — ABNORMAL LOW (ref 3.5–5.0)
Alkaline Phosphatase: 85 U/L (ref 38–126)
Anion gap: 12 (ref 5–15)
BUN: 91 mg/dL — ABNORMAL HIGH (ref 8–23)
CO2: 25 mmol/L (ref 22–32)
Calcium: 8.3 mg/dL — ABNORMAL LOW (ref 8.9–10.3)
Chloride: 105 mmol/L (ref 98–111)
Creatinine, Ser: 3.86 mg/dL — ABNORMAL HIGH (ref 0.44–1.00)
GFR, Estimated: 12 mL/min — ABNORMAL LOW (ref >60)
Glucose, Bld: 283 mg/dL — ABNORMAL HIGH (ref 70–99)
Potassium: 4.5 mmol/L (ref 3.5–5.1)
Sodium: 142 mmol/L (ref 135–145)
Total Bilirubin: 0.6 mg/dL (ref 0.3–1.2)
Total Protein: 5.1 g/dL — ABNORMAL LOW (ref 6.5–8.1)

## 2020-10-04 LAB — GLUCOSE, CAPILLARY
Glucose-Capillary: 227 mg/dL — ABNORMAL HIGH (ref 70–99)
Glucose-Capillary: 222 mg/dL — ABNORMAL HIGH (ref 70–99)
Glucose-Capillary: 266 mg/dL — ABNORMAL HIGH (ref 70–99)
Glucose-Capillary: 285 mg/dL — ABNORMAL HIGH (ref 70–99)
Glucose-Capillary: 254 mg/dL — ABNORMAL HIGH (ref 70–99)

## 2020-10-04 LAB — CBC
HCT: 34.4 % — ABNORMAL LOW (ref 36.0–46.0)
Hemoglobin: 10.8 g/dL — ABNORMAL LOW (ref 12.0–15.0)
MCH: 28.6 pg (ref 26.0–34.0)
MCHC: 31.4 g/dL (ref 30.0–36.0)
MCV: 91.2 fL (ref 80.0–100.0)
Platelets: 326 10*3/uL (ref 150–400)
RBC: 3.77 MIL/uL — ABNORMAL LOW (ref 3.87–5.11)
RDW: 15.3 % (ref 11.5–15.5)
WBC: 7.5 10*3/uL (ref 4.0–10.5)
nRBC: 0 % (ref 0.0–0.2)

## 2020-10-04 LAB — PHOSPHORUS: Phosphorus: 3.9 mg/dL (ref 2.5–4.6)

## 2020-10-04 LAB — MAGNESIUM: Magnesium: 2.1 mg/dL (ref 1.7–2.4)

## 2020-10-04 LAB — HEPARIN LEVEL (UNFRACTIONATED)
Heparin Unfractionated: 0.36 IU/mL (ref 0.30–0.70)
Heparin Unfractionated: 0.39 IU/mL (ref 0.30–0.70)

## 2020-10-04 MED ORDER — INSULIN ASPART 100 UNIT/ML IJ SOLN
5.0000 [IU] | INTRAMUSCULAR | Status: DC
  Administered 2020-10-04 – 2020-10-07 (×13): 5 [IU] via SUBCUTANEOUS

## 2020-10-04 MED ORDER — INSULIN DETEMIR 100 UNIT/ML ~~LOC~~ SOLN
13.0000 [IU] | Freq: Two times a day (BID) | SUBCUTANEOUS | Status: DC
  Administered 2020-10-04 – 2020-10-07 (×6): 13 [IU] via SUBCUTANEOUS
  Filled 2020-10-04 (×7): qty 0.13

## 2020-10-04 NOTE — Progress Notes (Signed)
ANTICOAGULATION CONSULT NOTE - Follow Up Consult  Pharmacy Consult for heparin Indication: atrial fibrillation and stroke  Labs: Recent Labs    10/02/20 0244 10/03/20 0210 10/03/20 1228 10/04/20 0020 10/04/20 0229  HGB 10.8* 11.6*  --  10.8*  --   HCT 34.0* 35.3*  --  34.4*  --   PLT 296 293  --  326  --   HEPARINUNFRC  --  <0.10* 0.15*  --  0.36  CREATININE 4.13* 4.02*  --  3.86*  --     Assessment/Plan:  70yo female therapeutic on heparin after rate change. Will continue infusion at current rate of 1150 units/hr and confirm stable with additional level.   Wynona Neat, PharmD, BCPS  10/04/2020,3:14 AM

## 2020-10-04 NOTE — Progress Notes (Signed)
ANTICOAGULATION CONSULT NOTE - Follow Up Consult  Pharmacy Consult for Heparin Indication: Afib / new CVA  Allergies  Allergen Reactions   Ace Inhibitors Swelling    Mouth area swelling   Atorvastatin Diarrhea and Other (See Comments)    Severe diarrhea   Benicar [Olmesartan] Swelling and Other (See Comments)    Angioedema   Epinephrine Hcl Hives        Levaquin [Levofloxacin In D5w] Swelling and Other (See Comments)    Angioedema    Losartan Swelling    Patient Measurements: Height: 5\' 3"  (160 cm) Weight: 84.1 kg (185 lb 6.5 oz) IBW/kg (Calculated) : 52.4   Vital Signs: Temp: 98.5 F (36.9 C) (08/04 1215) Temp Source: Oral (08/04 1215) BP: 144/58 (08/04 1215) Pulse Rate: 79 (08/04 1215)  Labs: Recent Labs    10/02/20 0244 10/02/20 0244 10/03/20 0210 10/03/20 1228 10/04/20 0020 10/04/20 0229 10/04/20 1054  HGB 10.8*  --  11.6*  --  10.8*  --   --   HCT 34.0*  --  35.3*  --  34.4*  --   --   PLT 296  --  293  --  326  --   --   HEPARINUNFRC  --    < > <0.10* 0.15*  --  0.36 0.39  CREATININE 4.13*  --  4.02*  --  3.86*  --   --    < > = values in this interval not displayed.     Estimated Creatinine Clearance: 13.9 mL/min (A) (by C-G formula based on SCr of 3.86 mg/dL (H)).   Assessment: 70 year old female to begin heparin for Afib and new CVA 7/28 with thrombectomy 7/29  Heparin level came back therapeutic again. Plan for apixaban when PO intake is adequate.   Goal of Therapy:  Heparin level 0.3 to 0.5 Monitor platelets by anticoagulation protocol: Yes   Plan:  Cont heparin 1150 units / hr Daily heparin level, CBC  Onnie Boer, PharmD, BCIDP, AAHIVP, CPP Infectious Disease Pharmacist 10/04/2020 12:58 PM

## 2020-10-04 NOTE — Progress Notes (Signed)
Physical Therapy Treatment Patient Details Name: Kathleen [name] MRN: [id number] DOB: 1950/05/19 Today's Date: 10/04/2020    History of Present Illness Pt is a 70 y.o. F who presents with right hemiplegia, facial droop and left gaze. Found to have distal left M1 occlusion s/p thrombectomy. Pt with new onset paroxysmal afib and acute respiratory insufficiencies with concern for ability to protect airway. Significant PMH: CKD stage 4, DM, CHF, morbid obesity, OSA on CPAP.    PT Comments    Patient continues to make steady progress towards physical therapy goals. Patient able to perform pivotal steps towards recliner with minA+2 and perform repeated sit to stands x 4 with minA+1. Patient following ~75% of commands and no attempts at verbalizations this session. Updated d/c recommendation to SNF at discharge to maximize functional mobility and safety.    Follow Up Recommendations  SNF     Equipment Recommendations  3in1 (PT);Wheelchair (measurements PT);Wheelchair cushion (measurements PT);Rolling [place] with 5" wheels    Recommendations for Other Services       Precautions / Restrictions Precautions Precautions: Fall;Other (comment) Precaution Comments: R hemiplegia, cortrak Restrictions Weight Bearing Restrictions: No    Mobility  Bed Mobility Overal bed mobility: Needs Assistance Bed Mobility: Supine to Sit     Supine to sit: Mod assist     General bed mobility comments: modA for R LE advancement, trunk elevation, and repositioning hips towards EOB    Transfers Overall transfer level: Needs assistance Equipment used: 2 person hand held assist Transfers: Sit to/from [facility] Sit to Stand: Min assist;+2 safety/equipment Stand pivot transfers: Min assist;+2 safety/equipment       General transfer comment: minA+2 to rise from low bed surface but able to peform repeated sit to stand x 4 from recliner with minA+1. Able to take pivotal steps towards  recliner with minA+2  Ambulation/Gait                 Stairs             Wheelchair Mobility    Modified Rankin (Stroke Patients Only) Modified Rankin (Stroke Patients Only) Pre-Morbid Rankin Score: No symptoms Modified Rankin: Moderately severe disability     Balance Overall balance assessment: Needs assistance Sitting-balance support: No upper extremity supported;Feet unsupported Sitting balance-Leahy Scale: Fair Sitting balance - Comments: Sitting EOB with Min Guard A during session   Standing balance support: During functional activity;Single extremity supported Standing balance-Leahy Scale: Fair                              Cognition Arousal/Alertness: Awake/alert Behavior During Therapy: Flat affect Overall Cognitive Status: Difficult to assess Area of Impairment: Following commands;Awareness;Problem solving;Attention                   Current Attention Level: Sustained   Following Commands: Follows one step commands with increased time   Awareness: Intellectual Problem Solving: Slow processing;Requires verbal cues;Requires tactile cues General Comments: Requires verbal, visual and tactile cues. Cues to maintain attention.      Exercises      General Comments        Pertinent Vitals/Pain Pain Assessment: Faces Pain Score: 0-No pain Faces Pain Scale: No hurt Pain Intervention(s): Monitored during session    Home Living                      Prior Function  PT Goals (current goals can now be found in the care plan section) Acute Rehab PT Goals Patient Stated Goal: unable to state PT Goal Formulation: Patient unable to participate in goal setting Time For Goal Achievement: 10/13/20 Potential to Achieve Goals: Fair Progress towards PT goals: Progressing toward goals    Frequency    Min 3X/week      PT Plan Discharge plan needs to be updated;Frequency needs to be updated     Co-evaluation PT/OT/SLP Co-Evaluation/Treatment: Yes Reason for Co-Treatment: For patient/therapist safety;To address functional/ADL transfers;Necessary to address cognition/behavior during functional activity PT goals addressed during session: Mobility/safety with mobility;Balance OT goals addressed during session: ADL's and self-care      AM-PAC PT "6 Clicks" Mobility   Outcome Measure  Help needed turning from your back to your side while in a flat bed without using bedrails?: A Lot Help needed moving from lying on your back to sitting on the side of a flat bed without using bedrails?: A Lot Help needed moving to and from a bed to a chair (including a wheelchair)?: Total Help needed standing up from a chair using your arms (e.g., wheelchair or bedside chair)?: Total Help needed to walk in hospital room?: Total Help needed climbing 3-5 steps with a railing? : Total 6 Click Score: 8    End of Session Equipment Utilized During Treatment: Gait belt;Oxygen Activity Tolerance: Patient tolerated treatment well Patient left: in chair;with call bell/phone within reach;with chair alarm set Nurse Communication: Mobility status PT Visit Diagnosis: Other abnormalities of gait and mobility (R26.89);Hemiplegia and hemiparesis Hemiplegia - Right/Left: Right Hemiplegia - caused by: Cerebral infarction     Time: 9432-7614 PT Time Calculation (min) (ACUTE ONLY): 24 min  Charges:  $Therapeutic Activity: 23-37 mins                     Gaddiel Cullens A. Gilford Rile PT, DPT Acute Rehabilitation Services Pager [phone] Office [phone]    Linna Hoff 10/04/2020, 12:57 PM

## 2020-10-04 NOTE — Consult Note (Signed)
Palliative Care Consult Note                                  Date: 10/04/2020   Patient Name: Kathleen [name]  DOB: [birth year]  MRN: [id number]  Age / Sex: 70 y.o., female  PCP: Kristen Loader, FNP Referring Physician: Flora Lipps, MD  Reason for Consultation: Establishing goals of care  HPI/Patient Profile: 70 y.o. female  with past medical history of CKD stage IV, diabetes mellitus type 2, chronic diastolic heart failure, hypertension, hyperlipidemia, OSA on CPAP, morbid obesity admitted on 09/27/2020 with CODE STROKE.  Patient presented with acute right hemiplegia with right facial droop and leftward gaze. She was admitted under stroke team status post thrombectomy of left M1 occlusion. Patient was extubated post procedure and able to follow commands but subsequently 729 afternoon she had decreased mentation and labored breathing and was unable to protect her airway.  Critical care was consulted and patient underwent intubation and mechanical ventilation in ICU admission.  Subsequently, patient was extubated and was considered stable for transfer out of the ICU.  CoreTrak placed for nutrition, SLP eval along with PT/OT. Recommended Dysphagia 1 diet. Concern for ability to get adequate intake to support needs. Pending placement at SNF for rehab. Currently aphasia, follows commands and shakes head yes/no. Sister Horris Latino [facility]) at bedside.  Past Medical History:  Diagnosis Date   Carotid artery plaque    duplex 07/2020 1-39% BICA   CKD (chronic kidney disease), stage IV (HCC)    Complication of anesthesia    pt states stopped breathing when having tonsillectomy at age 65; pt states has had anesthesia since then without difficulty    Diabetes mellitus    Glaucoma    Heart disease    Hyperlipidemia    Hypertension    Hyperthyroidism    Menopause    Mixed hyperlipidemia 09/22/2013   Morbid obesity ([place])    Multinodular goiter    AND  HYPERTHYROIDISM    Obesity (BMI 30-39.9) 04/30/2015   OSA on CPAP    Proliferative retinopathy    moderate, Dr. Posey Pronto   Proteinuria    Dr. Hassell Done   Pulmonary hypertension ([place])    PVC's (premature ventricular contractions)    Right-sided heart failure (HCC)    Sinus bradycardia    Trigger finger    Dr. Rip Harbour    Social History   Socioeconomic History   Marital status: Single    Spouse name: Not on file   Number of children: Not on file   Years of education: Not on file   Highest education level: Not on file  Occupational History   Not on file  Tobacco Use   Smoking status: Former    Packs/day: 1.00    Years: 30.00    Pack years: 30.00    Types: Cigarettes    Quit date: 03/04/2007    Years since quitting: 13.5   Smokeless tobacco: Never  Vaping Use   Vaping Use: Never used  Substance and Sexual Activity   Alcohol use: Yes    Alcohol/week: 0.0 standard drinks    Comment: occas   Drug use: No   Sexual activity: Not on file  Other Topics Concern   Not on file  Social History Narrative   Not on file   Social Determinants of Health   Financial Resource Strain: Not on file  Food Insecurity: Not on file  Transportation  Needs: Not on file  Physical Activity: Not on file  Stress: Not on file  Social Connections: Not on file    Family History  Problem Relation Age of Onset   Heart attack Father     Subjective:   This NP Walden Field reviewed medical records, received report from team, assessed the patient and then meet at the patient's bedside  to discuss diagnosis, prognosis, GOC, EOL wishes disposition and options.   Concept of Palliative Care was introduced as specialized medical care for people and their families living with serious illness.  If focuses on providing relief from the symptoms and stress of a serious illness.  The goal is to improve quality of life for both the patient and the family. Values and goals of care important to patient and family were  attempted to be elicited.  Created space and opportunity for patient  and family to explore thoughts and feelings regarding current medical situation   Natural trajectory and expectations at EOL were discussed. Questions and concerns addressed. Patient  encouraged to call with questions or concerns.    Life Review: Thr patient was never married, has no children. Lives in the [place] area. Sister lives in [place].  Patient Values: Unable to elicit due to aphagia  Patient/Family Understanding of Illness: Patient unable to verbalize, but appears to understand PMT discussion with her sister. Her sister knows she's had a large stroke and will require intensive rehab, which may not result in improvement. She's not safe to go to her home because she will need 24 hour care. The sister is unable to provide this either. They are planning to go to SNF for possible rehab, but may eventually need to transition to LTC. She knows the CoreTrak was placed to giver her nutrition but has to be removed before d/c (and at latest 2 weeks after placement).  Review of Systems  Unable to perform ROS: Patient nonverbal   Objective:   Primary Diagnoses: Present on Admission:  Stroke (cerebrum) ([place])  Pressure injury of skin  CRI (chronic renal insufficiency), stage 3 (moderate) (HCC)  Essential hypertension  Obesity (BMI 30-39.9)  OSA (obstructive sleep apnea)   Scheduled Meds:  amLODipine  10 mg Per Tube Daily   atorvastatin  80 mg Per Tube Daily   carvedilol  6.25 mg Per Tube BID WC   chlorhexidine  15 mL Mouth Rinse BID   Chlorhexidine Gluconate Cloth  6 each Topical Daily   feeding supplement (PROSource TF)  90 mL Per Tube BID   insulin aspart  0-20 Units Subcutaneous Q4H   insulin aspart  5 Units Subcutaneous Q4H   insulin detemir  13 Units Subcutaneous BID   mouth rinse  15 mL Mouth Rinse q12n4p   methimazole  5 mg Per Tube Daily   pantoprazole sodium  40 mg Per Tube QHS    polyethylene glycol  17 g Per Tube Daily    Continuous Infusions:  cefTRIAXone (ROCEPHIN)  IV Stopped (10/03/20 1838)   feeding supplement (OSMOLITE 1.5 CAL) 1,000 mL (10/02/20 1511)   heparin 1,150 Units/hr (10/04/20 0424)    PRN Meds: acetaminophen **OR** acetaminophen (TYLENOL) oral liquid 160 mg/5 mL **OR** acetaminophen, polyvinyl alcohol, senna-docusate  Allergies  Allergen Reactions   Ace Inhibitors Swelling    Mouth area swelling   Atorvastatin Diarrhea and Other (See Comments)    Severe diarrhea   Benicar [Olmesartan] Swelling and Other (See Comments)    Angioedema   Epinephrine Hcl Hives  Levaquin [Levofloxacin In D5w] Swelling and Other (See Comments)    Angioedema    Losartan Swelling    Physical Exam Vitals and nursing note reviewed.  Constitutional:      General: She is not in acute distress.    Appearance: She is obese. She is not toxic-appearing.  HENT:     Head: Normocephalic and atraumatic.  Cardiovascular:     Rate and Rhythm: Normal rate.  Pulmonary:     Effort: Pulmonary effort is normal.  Abdominal:     General: Abdomen is protuberant.     Palpations: Abdomen is soft.  Skin:    General: Skin is warm and dry.  Neurological:     Mental Status: She is alert.     Cranial Nerves: Facial asymmetry present.     Motor: Weakness present.     Comments: Aphasia noted, right-sided facial droop/weakness  Psychiatric:        Behavior: Behavior normal.    Vital Signs:  BP (!) 144/58 (BP Location: Right Arm)   Pulse 79   Temp 98.5 F (36.9 C) (Oral)   Resp 18   Ht 5\' 3"  (1.6 m)   Wt 84.1 kg   SpO2 (P) 93%   BMI 32.84 kg/m  Pain Scale: Faces   Pain Score: 0-No pain  SpO2: SpO2: (P) 93 % O2 Device:SpO2: (P) 93 % O2 Flow Rate: .O2 Flow Rate (L/min): 2 L/min  IO: Intake/output summary:  Intake/Output Summary (Last 24 hours) at 10/04/2020 1434 Last data filed at 10/04/2020 0424 Gross per 24 hour  Intake 489.12 ml  Output 700 ml  Net  -210.88 ml    LBM: Last BM Date: 10/02/20 Baseline Weight: Weight: 93.6 kg Most recent weight: Weight: 84.1 kg      Palliative Assessment/Data: 40%   Advanced Care Planning:   Primary Decision Maker: HCPOA  Code Status/Advance Care Planning: DNR  A discussion was had today regarding advanced directives. Concepts specific to code status, artifical feeding and hydration, continued IV antibiotics and rehospitalization was had.  The difference between a aggressive medical intervention path and a palliative comfort care path for this patient at this time was had. The MOST form was introduced and discussed and completed/scanned into the EMR/Vynca.  Decisions/Changes to ACP: DNR as previously noted ([place] completed and placed on the chart) Focus on comfort with no antibiotics, no IVF, hospital readmission only for comfort needs, TF for defined trial but NO PEG placement Treat the treatable otherwise Want to give SNF/Rehab an attempt Plan to encourage po intake, d/c CoreTrak when needed/at d/c to SNF  Assessment & Plan:   I have reviewed the medical record, interviewed the patient and family, and examined the patient. The following aspects are pertinent.  Impression: 70 year old female patient with admission for large CVA as described above. Significant deficits: one-sided weakness, aphasia, dysphagia. Currently with a CoreTrak in place that will need to be d/c in no more than another week or at d/c to SNF. Cleared for dysphagia 1 diet. Patient's HCPOA feels she would not want life-prolonging measures (including hospital readmission, PEG tube, parenteral antibiotics, IVF) given decreased quality of life. The patient does nod her head in agreement to this when asked. They would like trial of SNF/rehab with OP palliative care near the sister's home in [facility] and, if the patient doesn't progress or refuses therapy, planned transition to LTC with hospice.  SUMMARY OF  RECOMMENDATIONS   MOST form completed: no PEG, no IV antibiotics, no  IVF No hospital readmission unless unable to manage comfort Otherwise treat the treatable Ok to pull CoreTrak when needed, even if inadequate oral intake Trial of SNF rehab/palliative care, can consider transition to LTC/hospice pending her progress  Symptom Management:  Per primary team Palliative is available to assist with symptom management if needed  Palliative Prophylaxis:  Frequent Pain Assessment and dysphagia precautions  Additional Recommendations (Limitations, Scope, Preferences): Avoid Hospitalization, No IV Antibiotics, and No IV Fluids  Psycho-social/Spiritual:  Additional Recommendations: Caregiving  Support/Resources, Education on Hospice, and Grief/Bereavement Support  Prognosis:  Unable to determine  Discharge Planning:  [place] for rehab with Palliative care service follow-up   Discussed with: Patient, family, nursing staff, Dr. Kathalene Frames    Thank you for allowing [place] to participate in the care of JARETZY LHOMMEDIEU PMT will continue to support holistically.  Time In: 1:00 pm Time Out: 2:45 pm Time Total: 105 min  Greater than 50%  of this time was spent counseling and coordinating care related to the above assessment and plan.  Signed by: Walden Field, NP Palliative Medicine Team  Team Phone # [phone] (Nights/Weekends)  10/04/2020, 2:34 PM

## 2020-10-04 NOTE — TOC Progression Note (Signed)
Transition of Care [facility]) - Progression Note    Patient Details  Name: LUXE CUADROS MRN: [id number] Date of Birth: 11/06/1950  Transition of Care [facility]) CM/SW [place], [place] Phone Number: 10/04/2020, 4:08 PM  Clinical Narrative:   CSW contacted Santiago Glad with [facility] earlier today, confirmed that they received referral and that they were reviewing. Santiago Glad to update CSW when referral has been reviewed. CSW met with patient's sister at bedside to update, and she was appreciative of update. Sister provided the patient's HCPOA paperwork and CSW copied paperwork and placed in chart for medical team reference. CSW to continue to follow.    Expected Discharge Plan: [place] Barriers to Discharge: Continued Medical Work up  Expected Discharge Plan and Services Expected Discharge Plan: [place] In-house Referral: Clinical Social Work   Post Acute Care Choice: [place] Living arrangements for the past 2 months: Single Family Home                                       Social Determinants of Health (SDOH) Interventions    Readmission Risk Interventions No flowsheet data found.

## 2020-10-04 NOTE — Progress Notes (Signed)
Occupational Therapy Treatment Patient Details Name: Kathleen [name] MRN: [id number] DOB: September 11, 1950 Today's Date: 10/04/2020    History of present illness Pt is a 70 y.o. F who presents with right hemiplegia, facial droop and left gaze. Found to have distal left M1 occlusion s/p thrombectomy. Pt with new onset paroxysmal afib and acute respiratory insufficiencies with concern for ability to protect airway. Significant PMH: CKD stage 4, DM, CHF, morbid obesity, OSA on CPAP.   OT comments  Treatment focused on use of right upper extremity with grooming tasks. Patient able to hold wet tooth brush and brush teeth for approx 10 minutes of time in 4 bouts with therapist cleaning tooth brush each time and encouraging patient to spit. Patient unable though she did bring cup to mouth. Patient able to wash lower face and apply lubricant to lips with right hand. Able to perform bilateral hand task to open tooth paste x 2 - stabilizing cap with right hand. Attempts to get patient to fold wash cloth unsuccessful as patient perseverated on bringing cloth to mouth. Patient needed visual, verbal, and tactile cues throughout.     Follow Up Recommendations  SNF    Equipment Recommendations  3 in 1 bedside commode;Wheelchair (measurements OT);Wheelchair cushion (measurements OT);Hospital bed    Recommendations for Other Services      Precautions / Restrictions Precautions Precautions: Fall;Other (comment) Precaution Comments: R hemiplegia, cortrak Restrictions Weight Bearing Restrictions: No       Mobility Bed Mobility Overal bed mobility: Needs Assistance Bed Mobility: Supine to Sit     Supine to sit: Mod assist          Transfers Overall transfer level: Needs assistance Equipment used: 2 person hand held assist Transfers: Sit to/from Stand Sit to Stand: Mod assist;+2 physical assistance;+2 safety/equipment Stand pivot transfers: Min assist;+2 safety/equipment       General transfer  comment: Initally mod assist to rise from bed but then min assist to take steps to recliner.    Balance Overall balance assessment: Needs assistance Sitting-balance support: No upper extremity supported;Feet unsupported Sitting balance-Leahy Scale: Fair Sitting balance - Comments: Sitting EOB with Min Guard A during session   Standing balance support: No upper extremity supported;During functional activity Standing balance-Leahy Scale: Fair                             ADL either performed or assessed with clinical judgement   ADL Overall ADL's : Needs assistance/impaired     Grooming: Oral care;Min guard;Sitting;Wash/dry face Grooming Details (indicate cue type and reason): Patient performed oral care task with wet toothbrush (but without water or toothpaste) sitting in recliner. Patient brushed teeth with verbal cues to attend to left side of mouth using right hand. Patient holding cup in left hand. Performed brushing task x 4 (with therapist rinsing tooth brush clean each time) with verbal and visual cues for patient to spit. Patient unable to spit. She brings cup to mouth but doesn't physically attempt spitting. Able to use bilateral hands (right hand stabilizing cap while left hand twirled tube) twice to remove toothe paste lid. Patient able to bring cloth to face and wash mouth with right hand but prefers to perform it with left (as she perseverated on wiping mouth).  Vision Baseline Vision/History: Wears glasses Patient Visual Report: Other (comment) (cues to attend to the right) Vision Assessment?: Vision impaired- to be further tested in functional context   Perception     Praxis      Cognition Arousal/Alertness: Awake/alert Behavior During Therapy: Flat affect Overall Cognitive Status: Difficult to assess                     Current Attention Level: Sustained   Following Commands: Follows one step  commands with increased time   Awareness: Intellectual Problem Solving: Slow processing;Requires verbal cues;Requires tactile cues General Comments: Requires verbal, visual and tactile cues. Cues to maintain attention.        Exercises     Shoulder Instructions       General Comments      Pertinent Vitals/ Pain       Pain Assessment: Faces Pain Score: 0-No pain Faces Pain Scale: No hurt  Home Living                                          Prior Functioning/Environment              Frequency  Min 2X/week        Progress Toward Goals  OT Goals(current goals can now be found in the care plan section)  Progress towards OT goals: Progressing toward goals  Acute Rehab OT Goals Patient Stated Goal: Improve OT Goal Formulation: With family Time For Goal Achievement: 10/13/20 Potential to Achieve Goals: [place] Discharge plan needs to be updated    Co-evaluation          OT goals addressed during session: ADL's and self-care      AM-PAC OT "6 Clicks" Daily Activity     Outcome Measure   Help from another person eating meals?: A Lot Help from another person taking care of personal grooming?: A Little Help from another person toileting, which includes using toliet, bedpan, or urinal?: Total Help from another person bathing (including washing, rinsing, drying)?: A Lot Help from another person to put on and taking off regular upper body clothing?: A Lot Help from another person to put on and taking off regular lower body clothing?: Total 6 Click Score: 11    End of Session Equipment Utilized During Treatment: Oxygen;Gait belt  OT Visit Diagnosis: Unsteadiness on feet (R26.81);Muscle weakness (generalized) (M62.81);Cognitive communication deficit (R41.841);Hemiplegia and hemiparesis Symptoms and signs involving cognitive functions: Cerebral infarction Hemiplegia - Right/Left: Right Hemiplegia - dominant/non-dominant:  Dominant Hemiplegia - caused by: Cerebral infarction   Activity Tolerance Patient tolerated treatment well   Patient Left in chair;with call bell/phone within reach;with chair alarm set;with family/visitor present   Nurse Communication Mobility status        Time: 1030-1050 OT Time Calculation (min): 20 min  Charges: OT General Charges $OT Visit: 1 Visit OT Treatments $Self Care/Home Management : 8-22 mins  Derl Barrow, OTR/L [facility]  Office [phone] Pager: [place] 10/04/2020, 11:05 AM

## 2020-10-04 NOTE — Progress Notes (Signed)
Patient ID: Kathleen [name], female   DOB: 16-Jan-1951, 70 y.o.   MRN: [id number] Patient ID: Kathleen [name], female   DOB: 14-Jul-1950, 70 y.o.   MRN: [id number] STROKE TEAM PROGRESS NOTE   INTERVAL HISTORY Her sister is at the bedside.  Patient sitting up in bed.  She has shown improvement in her swallowing as per speech therapy and is now cleared for dysphagia 1 diet.  But has not been eating a whole lot.  Renal function is slightly improved today.  Appreciate nephrology input.  Vital signs stable.  Neurological exam unchanged   Vitals:   10/04/20 0419 10/04/20 0810 10/04/20 0830 10/04/20 1215  BP:  (!) 168/58  (!) 144/58  Pulse:  (!) 59 60 79  Resp:  20 16 18   Temp:  97.6 F (36.4 C)  98.5 F (36.9 C)  TempSrc:  Oral  Oral  SpO2:  100% 100% (P) 93%  Weight: 84.1 kg     Height:       CBC:  Recent Labs  Lab 09/27/20 1356 09/27/20 1401 10/03/20 0210 10/04/20 0020  WBC 18.2*   < > 7.4 7.5  NEUTROABS 15.9*  --   --   --   HGB 12.2   < > 11.6* 10.8*  HCT 38.9   < > 35.3* 34.4*  MCV 93.3   < > 89.4 91.2  PLT 343   < > 293 326   < > = values in this interval not displayed.   Basic Metabolic Panel:  Recent Labs  Lab 09/29/20 1639 09/30/20 0133 10/03/20 0210 10/04/20 0020  NA  --    < > 142 142  K  --    < > 4.1 4.5  CL  --    < > 105 105  CO2  --    < > 26 25  GLUCOSE  --    < > 285* 283*  BUN  --    < > 88* 91*  CREATININE  --    < > 4.02* 3.86*  CALCIUM  --    < > 8.2* 8.3*  MG 2.0   < > 2.2 2.1  PHOS 4.3  --   --  3.9   < > = values in this interval not displayed.   Lipid Panel:  Recent Labs  Lab 09/30/20 0133  CHOL 140  TRIG 63  HDL 50  CHOLHDL 2.8  VLDL 13  LDLCALC 77   HgbA1c:  Recent Labs  Lab 09/28/20 0245  HGBA1C 7.8*   Urine Drug Screen:  Recent Labs  Lab 09/28/20 0951  LABOPIA NONE DETECTED  COCAINSCRNUR NONE DETECTED  LABBENZ NONE DETECTED  AMPHETMU NONE DETECTED  THCU NONE DETECTED  LABBARB NONE DETECTED    Alcohol Level  Recent  Labs  Lab 09/27/20 1356  ETH <10    IMAGING past 24 hours No results found.  PHYSICAL EXAM General: Obese elderly Caucasian lady.  On [place]. No acute distress. Appears comfortable. HENT: [place] in place. Head: Normocephalic.  Skin: no obvious lesions.    Neurological Examination Mental Status: More alert today, sitting up in bed.  Expressive greater than receptive aphasia nodded yes/no to questions. She followed all commands.Non verbal. Did not name objects.  Cranial Nerves: Left gaze pref, but will look to right and cross midline. Right facial droop.  Motor:Tone and bulk:normal tone throughout; moving left side well . Right side weakness 4/5. Deep Tendon Reflexes: 2+ and symmetric throughout Plantars: Right: downgoing  Left: downgoing Cerebellar: no gross ataxia. Gait: unable due to aphasia  ASSESSMENT/PLAN [name] is a 70 y.o. female acute right hemiplegia, right facial droop, leftward gaze and global aphasia. NIHSS 27 on admission. CTA showed distal Left M1 occlusion with favorable perfusion missmatich. She was outside of time window for IV tPA, but was emergently treated with thrombectomy 09/28/20 with TICI 3 restoration of flow Dr Earleen Newport.  Stroke:  left MCA infarct; cardioembolic secondary to Afib (new dx, not on [place])  Code Stroke CT head No acute abnormality. Small vessel disease. Atrophy. ASPECTS 7.   CTA head & neck distal left M1 or proximal M2 MCA in the anterior aspect of the left sylvian fissure (series 10, images 85 through 88) which correlates with the hyperdensity seen on same day noncontrast head CT and is concerning for a high-grade stenosis or occlusion. The remainder of the CTA is essentially nondiagnostic in the head and neck due to non arterial timing. CT perfusion 55ml core in anterior LMCA. 86ml detected penumbra MRI  completed. Acute left MCA ischemic CVA. Petechial hemorrhage. No shift.  MRA revascularization of proximal MCA noted. 2D Echo  completed. EF 60-65%. Left atrial dilatation. No shunt/no thrombus. LDL 91 HgbA1c 7.8 VTE prophylaxis - scds    Diet   DIET - DYS 1 Room service appropriate? Yes; Fluid consistency: Honey Thick   none  prior to admission, now on aspirin 325mg  Via cortrak  Therapy recommendations:  CIR recommended Disposition:  pending  Hypertension Home meds:  Aldactone, norvasc, coreg, chlorthalidone Stable Permissive hypertension (OK if < 220/120) but gradually normalize in 5-7 days Long-term BP goal normotensive  Hyperlipidemia Home meds:  lipitor 80mg , resumed in hospital LDL 91, goal < 70 Add   High intensity statin  Continue statin at discharge  Diabetes type II Uncontrolled Home meds:  Farxiga, levemir flexpen, novolog HgbA1c 7.8, goal < 7.0 CBGs Recent Labs    10/04/20 0334 10/04/20 0806 10/04/20 1212  GLUCAP 227* 222* 266*    SSI Sugars still elevated. Increase levemir insulin to 12 units BID, managed by CCM  Pulmonary: tolerated BiPaP  Other Stroke Risk Factors Advanced Age >/= 40  Former Cigarette smoker; quit 96yrs ago Obesity, Body mass index is 32.84 kg/m., BMI >/= 30 associated with increased stroke risk, recommend weight loss, diet and exercise as appropriate  CAD Carotid artery disease Obstructive sleep apnea, on CPAP at home- RT consulted for to add this for daytime or nighttime sleeping.  Right side heart failure- stopped IVF and ordred Lasix 20mg . Foley catheter for strict I&Os.  New dx of Afib- 12lead ordered to confirm-not done yet.   Other Active Problems Morbid obesity- s/p gastric banding in 2010 with revision in 2020 Hyperthyroidism- on methimazole at home Dysphasia- d/t stroke-  Cortrak for nutrition and meds Atrial fibrillation - newly diagnosed - ASA 325 mg daily - anticoagulate when appropriate from stroke standpoint. CKD stage IV - creatinine - 3.98 Anemia - Hgb - 12.2->10.5->10.6 Code status - DNR NPO [facility] day # 7 Plan  continue dysphagia 1 diet and patient encouraged to eat.  She may need calorie count and supplemental feeds through core track tube.  She will not be able to transferred for rehab to skilled nursing facility unless core track tube comes out and she can eat adequately..  Continue ongoing therapies..  Discussed with patient and sister at the bedside and speech therapist and answered questions.  Continue iv hepairin drip for anticoagulation for  her new AFIB .  And will switch to Eliquis once patient is able to eat adequately and it is clear that she does not need a PEG tube.  Stroke team will sign off.  Kindly call for questions.  Discussed with Dr. Talmage Nap.  Greater than 50% time during this 25-minute visit was spent on counseling and coordination of care about her stroke and atrial fibrillation and anticoagulation and dysphagia and answering questions   Antony Contras, MD

## 2020-10-04 NOTE — Progress Notes (Signed)
PROGRESS NOTE  [name] CZY:[id number] DOB: 08/18/1950 DOA: 09/27/2020 PCP: Kristen Loader, FNP   LOS: 7 days   Brief narrative:  Kathleen [name] is a 70 y.o. female with past medical history of CKD stage IV, diabetes mellitus type 2, chronic diastolic heart failure, hypertension, hyperlipidemia, OSA on CPAP, morbid obesity presented to the hospital as a code stroke.  Patient presented with acute right hemiplegia with right facial droop and leftward gaze. She was admitted under stroke team status post thrombectomy of left M1 occlusion. Patient was extubated post procedure and able to follow commands but subsequently 729 afternoon she had decreased mentation and labored breathing and was unable to protect her airway.  Critical care was consulted and patient underwent intubation and mechanical ventilation in ICU admission.  Subsequently, patient was extubated and was considered stable for transfer out of the ICU.  Assessment/Plan:  Principal Problem:   Stroke (cerebrum) (HCC) Active Problems:   Essential hypertension   OSA (obstructive sleep apnea)   Obesity (BMI 30-39.9)   CRI (chronic renal insufficiency), stage 3 (moderate) (HCC)  Acute left MCA stroke s/p thrombectomy/ dysphagia. Thought to be cardioembolic due to new onset atrial fibrillation.  Neurology saw the patient.  At this time patient is on heparin drip.  Speech therapy has seen the patient and advised dysphagia 1 diet.  Still on cortrak tube tube.  Get calorie count.  Will need to be encouraged on oral intake.  Patient's family does not wish the patient to be on PEG tube.  We will continue to monitor closely.  Palliative care has been consulted.  Skilled therapy has been consulted who recommended CIR placement.  Currently on heparin drip but will switch to Eliquis when p.o. okay.  Stroke team has signed off.  Acute hypoxemic respiratory failure/Hx of OSA on CPAP Initially intubated for airway protection.  Subsequently was  on BiPAP.  This has been weaned to nasal cannula at this time.  Continue pulmonary hygiene.  Avoid oversedation.   Oliguric acute kidney injury on CKD stage 3b Baseline creatinine around 2.6.  Creatinine had trended up to 4.02.  Has slightly trended down today to creatinine of 3.8.  Seen by nephrology.  Patient likely had ATN.  Resolving.  Nephrology signed off.  Hyperthyroidism.  Continue methimazole.  Heart rate controlled.    Chronic diastolic CHF Compensated.  Hypertension/hyperlipidemia On amlodipine and Coreg.  We will continue to monitor closely.  Proximal A. fib, new onset 2D echocardiogram showed EF 55-60% with grade II diastolic dysfunction  11/3233. Continue Norvasc, Lipitor, Coreg,   Diabetes type 2 Continue sliding scale insulin, Accu-Cheks, diabetic diet.  We will continue to hold oral hypoglycemic agents.  Goals of care spoke with the patient's sister Kathleen [name] at bedside. she indicated that patient would not want  to be on long-term PEG tube feeding.  Currently on cortrak tube tube feeding with Osmolite.  Family wishes to discuss with palliative care about long-term goals  DVT prophylaxis: SCD's Start: 09/27/20 1528   Code Status: DNR  Family Communication: I again spoke with the patient's sister Kathleen [name] at bedside at length..    Status is: Inpatient  Remains inpatient appropriate because:IV treatments appropriate due to intensity of illness or inability to take PO and Inpatient level of care appropriate due to severity of illness  Dispo: The patient is from: Home              Anticipated d/c is to: SNF when p.o. okay, off  cortrak tube tube, will get palliative care discussion.              Patient currently is not medically stable to d/c.   Difficult to place patient No  Consultants: Critical care Neurology Palliative care Nephrology will consult.  Procedures: Intubation and mechanical ventilation Thrombectomy by IR on 7/29. BiPAP  placement.  Anti-infectives:  None  Anti-infectives (From admission, onward)    Start     Dose/Rate Route Frequency Ordered Stop   10/03/20 1600  cefTRIAXone (ROCEPHIN) 1 g in sodium chloride 0.9 % 100 mL IVPB        1 g 200 mL/hr over 30 Minutes Intravenous Every 24 hours 10/03/20 1452 10/08/20 1559   10/03/20 1545  cefTRIAXone (ROCEPHIN) 1 g in sodium chloride 0.9 % 100 mL IVPB  Status:  Discontinued        1 g 200 mL/hr over 30 Minutes Intravenous Every 24 hours 10/03/20 1451 10/03/20 1452      Subjective: Today, patient was seen and examined at bedside.  Sister at bedside.  Denies any pain.  No nausea vomiting or shortness of breath  Objective: Vitals:   10/04/20 0830 10/04/20 1215  BP:  (!) 144/58  Pulse: 60 79  Resp: 16 18  Temp:  98.5 F (36.9 C)  SpO2: 100% (P) 93%    Intake/Output Summary (Last 24 hours) at 10/04/2020 1317 Last data filed at 10/04/2020 0424 Gross per 24 hour  Intake 489.12 ml  Output 700 ml  Net -210.88 ml    Filed Weights   10/01/20 0434 10/02/20 0341 10/04/20 0419  Weight: 83.4 kg 82.2 kg 84.1 kg   Body mass index is 32.84 kg/m.   Physical Exam:  GENERAL: Patient is alert awake, chronically ill, sitting on chair, on nasal cannula oxygen, obese HENT: No scleral pallor or icterus. Pupils equally reactive to light. Oral mucosa is moist, cortrak tube tube in place. NECK: is supple, no gross swelling noted. CHEST: Decreased breath sounds bilaterally  CVS: S1 and S2 heard, no murmur. Regular rate and rhythm.  ABDOMEN: Soft, non-tender, bowel sounds are present. EXTREMITIES: No edema. CNS: Right-sided weakness noted. SKIN: warm and dry without rashes.  Data Review: I have personally reviewed the following laboratory data and studies,  CBC: Recent Labs  Lab 09/27/20 1356 09/27/20 1401 09/30/20 0133 10/01/20 0146 10/02/20 0244 10/03/20 0210 10/04/20 0020  WBC 18.2*   < > 9.9 9.8 9.4 7.4 7.5  NEUTROABS 15.9*  --   --   --   --   --    --   HGB 12.2   < > 10.5* 10.6* 10.8* 11.6* 10.8*  HCT 38.9   < > 31.6* 33.5* 34.0* 35.3* 34.4*  MCV 93.3   < > 89.0 89.8 91.2 89.4 91.2  PLT 343   < > 262 298 296 293 326   < > = values in this interval not displayed.    Basic Metabolic Panel: Recent Labs  Lab 09/28/20 1352 09/28/20 1745 09/29/20 0434 09/29/20 1639 09/30/20 0133 10/01/20 0146 10/02/20 0244 10/03/20 0210 10/04/20 0020  NA  --   --  138  --  140 142 142 142 142  K  --   --  4.4  --  4.6 4.5 4.3 4.1 4.5  CL  --   --  101  --  104 106 106 105 105  CO2  --   --  22  --  24 26 25 26 25   GLUCOSE  --   --  280*  --  229* 103* 132* 285* 283*  BUN  --   --  51*  --  66* 83* 89* 88* 91*  CREATININE  --   --  3.32*  --  3.67* 3.98* 4.13* 4.02* 3.86*  CALCIUM  --   --  8.1*  --  7.9* 8.0* 8.1* 8.2* 8.3*  MG 2.0 1.9 2.0 2.0 2.1  --   --  2.2 2.1  PHOS 5.4* 5.6* 5.6* 4.3  --   --   --   --  3.9    Liver Function Tests: Recent Labs  Lab 09/27/20 1356 10/04/20 0020  AST 38 18  ALT 13 9  ALKPHOS 69 85  BILITOT 2.9* 0.6  PROT 6.2* 5.1*  ALBUMIN 3.2* 2.1*    No results for input(s): LIPASE, AMYLASE in the last 168 hours. No results for input(s): AMMONIA in the last 168 hours. Cardiac Enzymes: No results for input(s): CKTOTAL, CKMB, CKMBINDEX, TROPONINI in the last 168 hours. BNP (last 3 results) No results for input(s): BNP in the last 8760 hours.  ProBNP (last 3 results) No results for input(s): PROBNP in the last 8760 hours.  CBG: Recent Labs  Lab 10/03/20 2021 10/03/20 2339 10/04/20 0334 10/04/20 0806 10/04/20 1212  GLUCAP 324* 251* 227* 222* 266*    Recent Results (from the past 240 hour(s))  Resp Panel by RT-PCR (Flu A&B, Covid) Nasopharyngeal Swab     Status: None   Collection Time: 09/27/20  1:56 PM   Specimen: Nasopharyngeal Swab; Nasopharyngeal(NP) swabs in vial transport medium  Result Value Ref Range Status   SARS Coronavirus 2 by RT PCR NEGATIVE NEGATIVE Final    Comment:  (NOTE) SARS-CoV-2 target nucleic acids are NOT DETECTED.  The SARS-CoV-2 RNA is generally detectable in upper respiratory specimens during the acute phase of infection. The lowest concentration of SARS-CoV-2 viral copies this assay can detect is 138 copies/mL. A negative result does not preclude SARS-Cov-2 infection and should not be used as the sole basis for treatment or other patient management decisions. A negative result may occur with  improper specimen collection/handling, submission of specimen other than nasopharyngeal swab, presence of viral mutation(s) within the areas targeted by this assay, and inadequate number of viral copies(<138 copies/mL). A negative result must be combined with clinical observations, patient history, and epidemiological information. The expected result is Negative.  Fact Sheet for Patients:  EntrepreneurPulse.com.au  Fact Sheet for Healthcare Providers:  IncredibleEmployment.be  This test is no t yet approved or cleared by the [place] FDA and  has been authorized for detection and/or diagnosis of SARS-CoV-2 by FDA under an Emergency Use Authorization (EUA). This EUA will remain  in effect (meaning this test can be used) for the duration of the COVID-19 declaration under Section 564(b)(1) of the Act, 21 U.S.C.section 360bbb-3(b)(1), unless the authorization is terminated  or revoked sooner.       Influenza A by PCR NEGATIVE NEGATIVE Final   Influenza B by PCR NEGATIVE NEGATIVE Final    Comment: (NOTE) The Xpert Xpress SARS-CoV-2/FLU/RSV plus assay is intended as an aid in the diagnosis of influenza from Nasopharyngeal swab specimens and should not be used as a sole basis for treatment. Nasal washings and aspirates are unacceptable for Xpert Xpress SARS-CoV-2/FLU/RSV testing.  Fact Sheet for Patients: EntrepreneurPulse.com.au  Fact Sheet for Healthcare  Providers: IncredibleEmployment.be  This test is not yet approved or cleared by the [place] FDA and has been authorized for detection and/or diagnosis of  SARS-CoV-2 by FDA under an Emergency Use Authorization (EUA). This EUA will remain in effect (meaning this test can be used) for the duration of the COVID-19 declaration under Section 564(b)(1) of the Act, 21 U.S.C. section 360bbb-3(b)(1), unless the authorization is terminated or revoked.  Performed at [facility], [place] [address], [place], [place] [address]   MRSA Next Gen by PCR, Nasal     Status: None   Collection Time: 09/27/20  8:31 PM   Specimen: Nasal Mucosa; Nasal Swab  Result Value Ref Range Status   MRSA by PCR Next Gen NOT DETECTED NOT DETECTED Final    Comment: (NOTE) The GeneXpert MRSA Assay (FDA approved for NASAL specimens only), is one component of a comprehensive MRSA colonization surveillance program. It is not intended to diagnose MRSA infection nor to guide or monitor treatment for MRSA infections. Test performance is not FDA approved in patients less than 19 years old. Performed at [facility], [place] [address], [place],  [address]       Studies: No results found.    Flora Lipps, MD  [facility] 10/04/2020  If 7PM-7AM, please contact night-coverage

## 2020-10-04 NOTE — Progress Notes (Signed)
Nephrology Follow-Up Consult note   Assessment/Recommendations: Kathleen [name] is a/an 70 y.o. female with a past medical history significant for CKD IV, DM2, CHF, HTN, HLD, OSA, obesity who presents with CVA c/b AKI   Non-Oliguric AKI improving on CKD IV: BL 2-2.6. Likely multifactorial with contrast use, UTI, and possible ATN contributing. BP has fluctuated possibly leading to some degree of ATN. She appears euvolemic and Crt continues to improve -Maintain hydration as able with feeds -Treat UTI as below -No diuretics needed -Continue to monitor daily Cr, Dose meds for GFR -Monitor Daily I/Os, Daily weight -Maintain MAP>65 for optimal renal perfusion. -Avoid nephrotoxic medications including NSAIDs and Vanc/Zosyn combo -No indication for HD  Given the patient's consistently improving creatinine for a couple days we will sign off at this time.  Creatinine will likely continue to improve slowly with time.  Please not hesitate to contact us if further problems arise.   Acute hypoxic respiratory failure: Overall improved.  No pulmonary crackles but does have coarse upper airway sounds.  Continue supplemental oxygen per primary team.  Consider repeat chest x-ray given previous concern for pneumonia.   UTI: Extensive pyuria on urinalysis.  Continue ceftriaxone and follow-up urine culture   Hypertension: Continue carvedilol and amlodipine.   CVA: Continue therapy and management per primary team and neurology.   Uncontrolled Diabetes Mellitus Type 2 with Hyperglycemia: Management per primary team   Recommendations conveyed to primary service.    [facility] 10/04/2020 9:31 AM  ___________________________________________________________  CC: CVA, AKI on CKD  Interval History/Subjective: No significant changes at this time.  Creatinine continues to improve to 3.9 today.  Patient is more tired and unable to answer questions   Medications:  Current  Facility-Administered Medications  Medication Dose Route Frequency Provider Last Rate Last Admin   acetaminophen (TYLENOL) tablet 650 mg  650 mg Oral Q4H PRN Kerney Elbe, MD   650 mg at 10/03/20 1732   Or   acetaminophen (TYLENOL) 160 MG/5ML solution 650 mg  650 mg Per Tube Q4H PRN Kerney Elbe, MD   650 mg at 10/02/20 2036   Or   acetaminophen (TYLENOL) suppository 650 mg  650 mg Rectal Q4H PRN Kerney Elbe, MD       amLODipine (NORVASC) tablet 10 mg  10 mg Per Tube Daily Garvin Fila, MD   10 mg at 10/04/20 0852   atorvastatin (LIPITOR) tablet 80 mg  80 mg Per Tube Daily Pham, Minh Q, RPH-CPP   80 mg at 10/04/20 0852   carvedilol (COREG) tablet 6.25 mg  6.25 mg Per Tube BID WC Jacky Kindle, MD   6.25 mg at 10/04/20 0856   cefTRIAXone (ROCEPHIN) 1 g in sodium chloride 0.9 % 100 mL IVPB  1 g Intravenous Q24H Reesa Chew, MD   Stopped at 10/03/20 1838   chlorhexidine (PERIDEX) 0.12 % solution 15 mL  15 mL Mouth Rinse BID Kerney Elbe, MD   15 mL at 10/04/20 0853   Chlorhexidine Gluconate Cloth 2 % PADS 6 each  6 each Topical Daily Amie Portland, MD   6 each at 10/04/20 0853   feeding supplement (OSMOLITE 1.5 CAL) liquid 1,000 mL  1,000 mL Per Tube Continuous Rosalin Hawking, MD 45 mL/hr at 10/02/20 1511 1,000 mL at 10/02/20 1511   feeding supplement (PROSource TF) liquid 90 mL  90 mL Per Tube BID Rosalin Hawking, MD   90 mL at 10/04/20 0852   heparin ADULT infusion 100 units/mL (25000  units/241mL)  1,150 Units/hr Intravenous Continuous Pham, Minh Q, RPH-CPP 11.5 mL/hr at 10/04/20 0424 1,150 Units/hr at 10/04/20 0424   insulin aspart (novoLOG) injection 0-20 Units  0-20 Units Subcutaneous Q4H Kerney Elbe, MD   7 Units at 10/04/20 0853   insulin detemir (LEVEMIR) injection 10 Units  10 Units Subcutaneous BID Flora Lipps, MD   10 Units at 10/04/20 0854   MEDLINE mouth rinse  15 mL Mouth Rinse q12n4p Kerney Elbe, MD   15 mL at 10/03/20 1729   methimazole (TAPAZOLE) tablet 5 mg  5 mg Per  Tube Daily Rosalin Hawking, MD   5 mg at 10/04/20 0852   pantoprazole sodium (PROTONIX) 40 mg/20 mL oral suspension 40 mg  40 mg Per Tube QHS Dorene Grebe, MD   40 mg at 10/03/20 2152   polyethylene glycol (MIRALAX / GLYCOLAX) packet 17 g  17 g Per Tube Daily Spero Geralds, MD   17 g at 10/04/20 0854   polyvinyl alcohol (LIQUIFILM TEARS) 1.4 % ophthalmic solution 1 drop  1 drop Both Eyes PRN Heloise Purpura, RPH       senna-docusate (Senokot-S) tablet 1 tablet  1 tablet Per Tube QHS PRN Rosalin Hawking, MD          Review of Systems: Unable to obtain due to the patient's mental status today  Physical Exam: Vitals:   10/04/20 0810 10/04/20 0830  BP: (!) 168/58   Pulse: (!) 59 60  Resp: 20 16  Temp: 97.6 F (36.4 C)   SpO2: 100% 100%   No intake/output data recorded.  Intake/Output Summary (Last 24 hours) at 10/04/2020 0931 Last data filed at 10/04/2020 0424 Gross per 24 hour  Intake 489.12 ml  Output 800 ml  Net -310.88 ml   Constitutional: On BiPAP, chronically ill-appearing ENMT: ears and nose without scars or lesions, MMM CV: normal rate, no edema Respiratory: coarse bilateral breath sounds, normal work of breathing Gastrointestinal: soft, non-tender, no palpable masses or hernias Skin: no visible lesions or rashes Psych: Awake and alert, minimally interactive   Test Results I personally reviewed new and old clinical labs and radiology tests Lab Results  Component Value Date   NA 142 10/04/2020   K 4.5 10/04/2020   CL 105 10/04/2020   CO2 25 10/04/2020   BUN 91 (H) 10/04/2020   CREATININE 3.86 (H) 10/04/2020   CALCIUM 8.3 (L) 10/04/2020   ALBUMIN 2.1 (L) 10/04/2020   PHOS 3.9 10/04/2020

## 2020-10-04 NOTE — Progress Notes (Signed)
Patient has NG tube in place.  CPAP not placed on patient at this time.  Patient on 2L VS stable.  RT will continue to monitor.

## 2020-10-04 NOTE — Progress Notes (Signed)
Inpatient Diabetes Program Recommendations  AACE/ADA: New Consensus Statement on Inpatient Glycemic Control   Target Ranges:  Prepandial:   less than 140 mg/dL      Peak postprandial:   less than 180 mg/dL (1-2 hours)      Critically ill patients:  140 - 180 mg/dL   Results for Kathleen [name], Kathleen [name] (MRN [id number]) as of 10/04/2020 09:22  Ref. Range 10/03/2020 08:27 10/03/2020 11:08 10/03/2020 15:42 10/03/2020 20:21 10/03/2020 23:39 10/04/2020 03:34 10/04/2020 08:06  Glucose-Capillary Latest Ref Range: 70 - 99 mg/dL 243 (H) 260 (H) 322 (H) 324 (H) 251 (H) 227 (H) 222 (H)    Review of Glycemic Control  Current orders for Inpatient glycemic control: Levemir 10 units BID, Novolog 0-20 units Q4H; Osmolite @ 45 ml/hr   Inpatient Diabetes Program Recommendations:     Insulin: Please consider increasing Levemir to 13 units BID and ordering Novolog 5 units Q4H for tube feeding coverage. If tube feeding is stopped or held then Novolog tube feeding coverage should also be stopped or held.   Thanks, Barnie Alderman, RN, MSN, CDE Diabetes Coordinator Inpatient Diabetes Program [phone] (Team Pager from 8am to 5pm)

## 2020-10-05 DIAGNOSIS — N183 Chronic kidney disease, stage 3 unspecified: Secondary | ICD-10-CM | POA: Diagnosis not present

## 2020-10-05 DIAGNOSIS — I10 Essential (primary) hypertension: Secondary | ICD-10-CM | POA: Diagnosis not present

## 2020-10-05 DIAGNOSIS — E669 Obesity, unspecified: Secondary | ICD-10-CM | POA: Diagnosis not present

## 2020-10-05 LAB — CBC
HCT: 34.3 % — ABNORMAL LOW (ref 36.0–46.0)
Hemoglobin: 10.9 g/dL — ABNORMAL LOW (ref 12.0–15.0)
MCH: 29.3 pg (ref 26.0–34.0)
MCHC: 31.8 g/dL (ref 30.0–36.0)
MCV: 92.2 fL (ref 80.0–100.0)
Platelets: 333 10*3/uL (ref 150–400)
RBC: 3.72 MIL/uL — ABNORMAL LOW (ref 3.87–5.11)
RDW: 15.4 % (ref 11.5–15.5)
WBC: 9.2 10*3/uL (ref 4.0–10.5)
nRBC: 0 % (ref 0.0–0.2)

## 2020-10-05 LAB — BASIC METABOLIC PANEL
Anion gap: 8 (ref 5–15)
BUN: 93 mg/dL — ABNORMAL HIGH (ref 8–23)
CO2: 31 mmol/L (ref 22–32)
Calcium: 8.7 mg/dL — ABNORMAL LOW (ref 8.9–10.3)
Chloride: 107 mmol/L (ref 98–111)
Creatinine, Ser: 3.39 mg/dL — ABNORMAL HIGH (ref 0.44–1.00)
GFR, Estimated: 14 mL/min — ABNORMAL LOW (ref >60)
Glucose, Bld: 196 mg/dL — ABNORMAL HIGH (ref 70–99)
Potassium: 4.3 mmol/L (ref 3.5–5.1)
Sodium: 146 mmol/L — ABNORMAL HIGH (ref 135–145)

## 2020-10-05 LAB — GLUCOSE, CAPILLARY
Glucose-Capillary: 140 mg/dL — ABNORMAL HIGH (ref 70–99)
Glucose-Capillary: 168 mg/dL — ABNORMAL HIGH (ref 70–99)
Glucose-Capillary: 175 mg/dL — ABNORMAL HIGH (ref 70–99)
Glucose-Capillary: 191 mg/dL — ABNORMAL HIGH (ref 70–99)
Glucose-Capillary: 196 mg/dL — ABNORMAL HIGH (ref 70–99)
Glucose-Capillary: 225 mg/dL — ABNORMAL HIGH (ref 70–99)
Glucose-Capillary: 247 mg/dL — ABNORMAL HIGH (ref 70–99)

## 2020-10-05 LAB — HEPARIN LEVEL (UNFRACTIONATED): Heparin Unfractionated: 0.42 IU/mL (ref 0.30–0.70)

## 2020-10-05 LAB — MAGNESIUM: Magnesium: 2.4 mg/dL (ref 1.7–2.4)

## 2020-10-05 MED ORDER — LATANOPROST 0.005 % OP SOLN
1.0000 [drp] | Freq: Every day | OPHTHALMIC | Status: DC
  Administered 2020-10-05 – 2020-10-08 (×4): 1 [drp] via OPHTHALMIC
  Filled 2020-10-05: qty 2.5

## 2020-10-05 MED ORDER — PREDNISOLONE ACETATE 1 % OP SUSP: 1.0000 [drp] | OPHTHALMIC | Status: DC

## 2020-10-05 MED ORDER — KETOROLAC TROMETHAMINE 0.5 % OP SOLN: 1.0000 [drp] | OPHTHALMIC | Status: DC

## 2020-10-05 MED ORDER — BRIMONIDINE TARTRATE 0.2 % OP SOLN
1.0000 [drp] | Freq: Two times a day (BID) | OPHTHALMIC | Status: DC
  Administered 2020-10-05 – 2020-10-09 (×8): 1 [drp] via OPHTHALMIC
  Filled 2020-10-05: qty 5

## 2020-10-05 MED ORDER — TIMOLOL MALEATE 0.5 % OP SOLN
1.0000 [drp] | Freq: Every day | OPHTHALMIC | Status: DC
  Administered 2020-10-06 – 2020-10-09 (×4): 1 [drp] via OPHTHALMIC
  Filled 2020-10-05: qty 5

## 2020-10-05 MED ORDER — TIMOLOL HEMIHYDRATE 0.5 % OP SOLN: 1.0000 [drp] | Freq: Every morning | OPHTHALMIC | Status: DC

## 2020-10-05 NOTE — Progress Notes (Signed)
ANTICOAGULATION CONSULT NOTE - Follow Up Consult  Pharmacy Consult for Heparin Indication: Afib / new CVA  Allergies  Allergen Reactions   Ace Inhibitors Swelling    Mouth area swelling   Atorvastatin Diarrhea and Other (See Comments)    Severe diarrhea   Benicar [Olmesartan] Swelling and Other (See Comments)    Angioedema   Epinephrine Hcl Hives        Levaquin [Levofloxacin In D5w] Swelling and Other (See Comments)    Angioedema    Losartan Swelling    Patient Measurements: Height: 5\' 3"  (160 cm) Weight: 84.1 kg (185 lb 6.5 oz) IBW/kg (Calculated) : 52.4   Vital Signs: Temp: 97.7 F (36.5 C) (08/05 0754) Temp Source: Oral (08/05 0754) BP: 190/76 (08/05 0754) Pulse Rate: 64 (08/05 0754)  Labs: Recent Labs    10/03/20 0210 10/03/20 1228 10/04/20 0020 10/04/20 0229 10/04/20 1054 10/05/20 0358  HGB 11.6*  --  10.8*  --   --  10.9*  HCT 35.3*  --  34.4*  --   --  34.3*  PLT 293  --  326  --   --  333  HEPARINUNFRC <0.10*   < >  --  0.36 0.39 0.42  CREATININE 4.02*  --  3.86*  --   --  3.39*   < > = values in this interval not displayed.     Estimated Creatinine Clearance: 15.9 mL/min (A) (by C-G formula based on SCr of 3.39 mg/dL (H)).   Assessment: 70 year old female to begin heparin for Afib and new CVA 7/28 with thrombectomy 7/29.  Heparin level remains therapeutic today at 0.42, on 1150 units/hr. Hgb 10.9, plt 333. No s/sx of bleeding or infusion issues. Plan for apixaban when PO intake is adequate.   Goal of Therapy:  Heparin level 0.3 to 0.5 Monitor platelets by anticoagulation protocol: Yes   Plan:  Cont heparin 1150 units / hr Daily heparin level, CBC  Antonietta Jewel, PharmD, [place] Pharmacist  Phone: [phone] 10/05/2020 9:29 AM  Please check AMION for all [facility] phone numbers After 10:00 PM, call [place] [phone]

## 2020-10-05 NOTE — Progress Notes (Signed)
Nutrition Follow-up  DOCUMENTATION CODES:   Obesity unspecified  INTERVENTION:  -d/c Cortrak and tube feeding orders -d/c calorie count -Magic cup TID with meals, each supplement provides 290 kcal and 9 grams of protein  NUTRITION DIAGNOSIS:   Inadequate oral intake related to inability to eat as evidenced by NPO status. Progressing, pt on PO diet.   GOAL:   Patient will meet greater than or equal to 90% of their needs Progressing.   MONITOR:   TF tolerance, Labs, Skin  REASON FOR ASSESSMENT:   Consult Enteral/tube feeding initiation and management  ASSESSMENT:   Pt with PMH of bilateral carotid artery plaque, CKD4, DM, HLD, CHF, HTN, hyperthyroidism, morbid obesity, OSA on CPAP, proliferative retinopathy, pulmonary HTN, gastric band surgery 2010 and revision in 2011 now admitted after being found down by neighbor with L MCA stroke.   7/28 s/p thrombectomy  7/29 cortrak attempted unable to advance due to gastric band; tube advanced by radiology  8/2 s/p MBS ok to have some purees and honey thick liquids from the floor  8/3 diet advanced to dysphagia 1 with honey thick supplements   Calorie count was started yesterday, pt ate 25% of one documented meal and 45% of another. In total, pt ate 507 kcals and 27 grams of protein yesterday, or 28% minimum estimated calorie and 27% minimum estimated protein needs, respectively. PMT met with pt/family and decision was made that TF trials were to be temporary only and pt does not want tube feeding via PEG. MD noted plan to remove Cortrak at time of discharge even if PO intake is inadequate. Discussed pt with RN who reports pt is eager to eat. Given plan to d/c Cortrak regardless of intake and PMT notes indicating preference to shift towards more comfort-based approach, RD instructed RN to go ahead and remove Cortrak to improve pt's comfort and hopeful this will also increase po intake.   Medications reviewed and include: SSI, novolog,  levemir, protonix, miralax, IV abx Labs reviewed: Na 146 (H), Cr 3.39 (H) CBGs: 175-168-191   UOP: 400 ml x24 hours I&O: + 6 L since admit Moderate edema noted  Diet Order:   Diet Order             DIET - DYS 1 Room service appropriate? Yes; Fluid consistency: Honey Thick  Diet effective now                   EDUCATION NEEDS:   Not appropriate for education at this time  Skin:  Skin Assessment: Reviewed RN Assessment  Last BM:  8/5  Height:   Ht Readings from Last 1 Encounters:  09/27/20 5' 3" (1.6 m)    Weight:   Wt Readings from Last 1 Encounters:  10/04/20 84.1 kg    BMI:  Body mass index is 32.84 kg/m.  Estimated Nutritional Needs:   Kcal:  1800-2000  Protein:  100-120 grams  Fluid:  > 1.8 L/day   Amanda Averett, MS, RD, LDN (she/her/hers) RD pager number and weekend/on-call pager number located in Amion.   

## 2020-10-05 NOTE — TOC Progression Note (Signed)
Transition of Care [facility]) - Progression Note    Patient Details  Name: RENEE ERB MRN: [id number] Date of Birth: Mar 25, 1950  Transition of Care [facility]) CM/SW [place], [place] Phone Number: 10/05/2020, 3:57 PM  Clinical Narrative:   CSW worked towards discharge planning throughout the day today. [facility] responded back to CSW that they are unable to offer a LTC bed for the patient so unable to accept. CSW contacted other SNFs in the [facility] area:  [facility]: Spoke with Mikki Santee, faxed referral over for review [facility]: Spoke with Jackelyn Poling, faxed referral over for review [place]: Left a voicemail for [facility]: [place] with Tammy, no beds available right now [place]: Spoke with Florentina Jenny, faxed referral over for review [place]: Left a voicemail for [place], faxed referral over for review, she won't be in the office until Monday [place]: Spoke with Margarita Grizzle, faxed referral over for review.  [facility] has offered a bed for the patient, and CSW met with sister at bedside to discuss. Sister would like to go and visit the SNF, but also didn't want to lose the bed, so CSW secured the bed for the patient. Bed will be available Monday, if patient is ready for discharge. CSW also contacted transportation. [facility] can handle the transport, cost will be $3441.24. [facility] can handle transport, cost will be $2712.81. CSW scheduled with [facility] for Tuesday morning at 10 am. CSW to follow.    Expected Discharge Plan: [place] Barriers to Discharge: Continued Medical Work up  Expected Discharge Plan and Services Expected Discharge Plan: [place] In-house Referral: Clinical Social Work   Post Acute Care Choice: [place] Living arrangements for the past 2 months: Single Family Home                                       Social  Determinants of Health (SDOH) Interventions    Readmission Risk Interventions No flowsheet data found.

## 2020-10-05 NOTE — Progress Notes (Signed)
  Speech Language Pathology Treatment: Dysphagia  Patient Details Name: Kathleen [name] MRN: [id number] DOB: 1950/11/10 Today's Date: 10/05/2020 Time: 1320-1350 SLP Time Calculation (min) (ACUTE ONLY): 30 min  Assessment / Plan / Recommendation Clinical Impression  Pt sister pt tolerated puree and honey thick juice on lunch tray, ate well. SLP offered cup sisp of honey thick water, pt did have immediate and delayed coughing,  likely due to overlarge sips. Advised controlled sips or teaspoons of thickened water. Continue current diet. Pt able to identify 8/10 family members in a photo by pointing. Able to articulate the name [place] with written and articulation placement cues. Making progress.   HPI HPI: Pt is a 70 y.o. F who presents with right hemiplegia, facial droop and left gaze. Found to have distal left M1 occlusion s/p thrombectomy. Pt with new onset paroxysmal afib and acute respiratory insufficiencies with concern for ability to protect airway. Significant PMH: CKD stage 4, DM, CHF, morbid obesity, OSA on CPAP.      SLP Plan  Continue with current plan of care       Recommendations  Diet recommendations: Honey-thick liquid;Dysphagia 1 (puree) Liquids provided via: Cup;Teaspoon;Straw Medication Administration: Whole meds with puree Supervision: Staff to assist with self feeding Compensations: Slow rate;Small sips/bites Postural Changes and/or Swallow Maneuvers: Upright 30-60 min after meal;Seated upright 90 degrees                Oral Care Recommendations: Oral care BID Follow up Recommendations: Skilled Nursing facility SLP Visit Diagnosis: Dysphagia, oropharyngeal phase (R13.12) Plan: Continue with current plan of care       GO                Kathleen [name], [name] 10/05/2020, 1:54 PM

## 2020-10-05 NOTE — Progress Notes (Signed)
PROGRESS NOTE  Kathleen [name] NUU:[id number] DOB: [birth year] DOA: 09/27/2020 PCP: Kristen Loader, FNP   LOS: 8 days   Brief narrative:  Kathleen [name] is a 70 y.o. female with past medical history of CKD stage IV, diabetes mellitus type 2, chronic diastolic heart failure, hypertension, hyperlipidemia, OSA on CPAP, morbid obesity presented to the hospital as a code stroke.  Patient presented with acute right hemiplegia with right facial droop and leftward gaze. She was admitted under stroke team status post thrombectomy of left M1 occlusion. Patient was extubated post procedure and able to follow commands but subsequently 729 afternoon she had decreased mentation and labored breathing and was unable to protect her airway.  Critical care was consulted and patient underwent intubation and mechanical ventilation in ICU admission.  Subsequently, patient was extubated and was considered stable for transfer out of the ICU.  Assessment/Plan:  Principal Problem:   Stroke (cerebrum) (HCC) Active Problems:   Essential hypertension   OSA (obstructive sleep apnea)   Obesity (BMI 30-39.9)   CRI (chronic renal insufficiency), stage 3 (moderate) (HCC)  Acute left MCA stroke s/p thrombectomy/ dysphagia. Thought to be cardioembolic due to new onset atrial fibrillation.  Neurology saw the patient.  At this time, patient is on heparin drip.  Speech therapy has seen the patient and advised dysphagia 1 diet.  Still on cortrak tube tube.  Get calorie count.  Will need to be encouraged on oral intake.  Patient's family does not wish the patient to be on PEG tube.  We will continue to monitor closely.  Palliative care has been consulted.  Skilled therapy has been consulted who recommended CIR placement.  Currently on heparin drip but will switch to Eliquis when p.o. okay.  Stroke team has signed off.  Acute hypoxemic respiratory failure/Hx of OSA on CPAP Initially intubated for airway protection.  Subsequently  was on BiPAP.  Oxygen was subsequently weaned off.  Continue pulmonary hygiene.  Avoid oversedation.  UTI.  Continue Rocephin to complete 3-day course.  Urine culture showed 20,000 colonies    Oliguric acute kidney injury on CKD stage 3b Baseline creatinine around 2.6.  Creatinine had trended up to 4.02.  Creatinine of 3.3.  Seen by nephrology.  At this time nephrology has signed off.  We will continue supportive care.  Hyperthyroidism.  Continue methimazole.  Heart rate controlled.    Chronic diastolic CHF Compensated.  Continue to monitor.  Hypertension/hyperlipidemia On amlodipine and Coreg.  We will continue to monitor closely.  Proximal A. fib, new onset 2D echocardiogram showed EF 55-60% with grade II diastolic dysfunction  34/7425. Continue Norvasc, Lipitor, Coreg,   Diabetes type 2 with hyperglycemia Continue sliding scale insulin, Accu-Cheks, diabetic diet.  We will continue to hold oral hypoglycemic agents.  Goals of care Palliative care on board.  At this time no aggressive intervention planned including PEG tube feeding.  Plan for skilled nursing facility with palliative care..  DVT prophylaxis: SCD's Start: 09/27/20 1528   Code Status: DNR  Family Communication: None today.    Status is: Inpatient  Remains inpatient appropriate because:IV treatments appropriate due to intensity of illness or inability to take PO and Inpatient level of care appropriate due to severity of illness  Dispo: The patient is from: Home              Anticipated d/c is to: SNF with palliative care.              Patient currently is not  medically stable to d/c.   Difficult to place patient No  Consultants: Critical care Neurology Palliative care Nephrology will consult.  Procedures: Intubation and mechanical ventilation Thrombectomy by IR on 7/29. BiPAP placement.  Anti-infectives:  None  Anti-infectives (From admission, onward)    Start     Dose/Rate Route Frequency Ordered  Stop   10/03/20 1600  cefTRIAXone (ROCEPHIN) 1 g in sodium chloride 0.9 % 100 mL IVPB        1 g 200 mL/hr over 30 Minutes Intravenous Every 24 hours 10/03/20 1452 10/08/20 1559   10/03/20 1545  cefTRIAXone (ROCEPHIN) 1 g in sodium chloride 0.9 % 100 mL IVPB  Status:  Discontinued        1 g 200 mL/hr over 30 Minutes Intravenous Every 24 hours 10/03/20 1451 10/03/20 1452      Subjective: Today, patient was seen and examined at bedside.   Appears to be eating little more today.  On cortrak tube tube.  Objective: Vitals:   10/05/20 0754 10/05/20 0900  BP: (!) 190/76 (!) 172/59  Pulse: 64   Resp: 18   Temp: 97.7 F (36.5 C)   SpO2: 98%     Intake/Output Summary (Last 24 hours) at 10/05/2020 1216 Last data filed at 10/04/2020 1812 Gross per 24 hour  Intake 140 ml  Output 400 ml  Net -260 ml    Filed Weights   10/01/20 0434 10/02/20 0341 10/04/20 0419  Weight: 83.4 kg 82.2 kg 84.1 kg   Body mass index is 32.84 kg/m.   Physical Exam:  General: Obese built, not in obvious distress, chronically ill, on room air.  HENT:   No scleral pallor or icterus noted. Oral mucosa is moist.  Cortrak tube tube in place. Chest: Decreased breath sounds bilaterally.  No obvious crackles or wheezes noted CVS: S1 &S2 heard. No murmur.  Regular rate and rhythm. Abdomen: Soft, nontender, nondistended.  Bowel sounds are heard.   Extremities: No cyanosis, clubbing or edema.  Peripheral pulses are palpable. Psych: Alert, awake following few commands, right-sided weakness. CNS:  No cranial nerve deficits.  Power equal in all extremities.   Skin: Warm and dry.  No rashes noted.  Data Review: I have personally reviewed the following laboratory data and studies,  CBC: Recent Labs  Lab 10/01/20 0146 10/02/20 0244 10/03/20 0210 10/04/20 0020 10/05/20 0358  WBC 9.8 9.4 7.4 7.5 9.2  HGB 10.6* 10.8* 11.6* 10.8* 10.9*  HCT 33.5* 34.0* 35.3* 34.4* 34.3*  MCV 89.8 91.2 89.4 91.2 92.2  PLT 298 296 293  326 196    Basic Metabolic Panel: Recent Labs  Lab 09/28/20 1352 09/28/20 1745 09/28/20 1745 09/29/20 0434 09/29/20 1639 09/30/20 0133 10/01/20 0146 10/02/20 0244 10/03/20 0210 10/04/20 0020 10/05/20 0358  NA  --   --    < > 138  --  140 142 142 142 142 146*  K  --   --    < > 4.4  --  4.6 4.5 4.3 4.1 4.5 4.3  CL  --   --    < > 101  --  104 106 106 105 105 107  CO2  --   --    < > 22  --  24 26 25 26 25 31   GLUCOSE  --   --    < > 280*  --  229* 103* 132* 285* 283* 196*  BUN  --   --    < > 51*  --  66* 83*  89* 88* 91* 93*  CREATININE  --   --    < > 3.32*  --  3.67* 3.98* 4.13* 4.02* 3.86* 3.39*  CALCIUM  --   --    < > 8.1*  --  7.9* 8.0* 8.1* 8.2* 8.3* 8.7*  MG 2.0 1.9  --  2.0 2.0 2.1  --   --  2.2 2.1 2.4  PHOS 5.4* 5.6*  --  5.6* 4.3  --   --   --   --  3.9  --    < > = values in this interval not displayed.    Liver Function Tests: Recent Labs  Lab 10/04/20 0020  AST 18  ALT 9  ALKPHOS 85  BILITOT 0.6  PROT 5.1*  ALBUMIN 2.1*    No results for input(s): LIPASE, AMYLASE in the last 168 hours. No results for input(s): AMMONIA in the last 168 hours. Cardiac Enzymes: No results for input(s): CKTOTAL, CKMB, CKMBINDEX, TROPONINI in the last 168 hours. BNP (last 3 results) No results for input(s): BNP in the last 8760 hours.  ProBNP (last 3 results) No results for input(s): PROBNP in the last 8760 hours.  CBG: Recent Labs  Lab 10/04/20 2022 10/05/20 0025 10/05/20 0421 10/05/20 0443 10/05/20 0753  GLUCAP 254* 247* 175* 168* 191*    Recent Results (from the past 240 hour(s))  Resp Panel by RT-PCR (Flu A&B, Covid) Nasopharyngeal Swab     Status: None   Collection Time: 09/27/20  1:56 PM   Specimen: Nasopharyngeal Swab; Nasopharyngeal(NP) swabs in vial transport medium  Result Value Ref Range Status   SARS Coronavirus 2 by RT PCR NEGATIVE NEGATIVE Final    Comment: (NOTE) SARS-CoV-2 target nucleic acids are NOT DETECTED.  The SARS-CoV-2 RNA is  generally detectable in upper respiratory specimens during the acute phase of infection. The lowest concentration of SARS-CoV-2 viral copies this assay can detect is 138 copies/mL. A negative result does not preclude SARS-Cov-2 infection and should not be used as the sole basis for treatment or other patient management decisions. A negative result may occur with  improper specimen collection/handling, submission of specimen other than nasopharyngeal swab, presence of viral mutation(s) within the areas targeted by this assay, and inadequate number of viral copies(<138 copies/mL). A negative result must be combined with clinical observations, patient history, and epidemiological information. The expected result is Negative.  Fact Sheet for Patients:  EntrepreneurPulse.com.au  Fact Sheet for Healthcare Providers:  IncredibleEmployment.be  This test is no t yet approved or cleared by the [place] FDA and  has been authorized for detection and/or diagnosis of SARS-CoV-2 by FDA under an Emergency Use Authorization (EUA). This EUA will remain  in effect (meaning this test can be used) for the duration of the COVID-19 declaration under Section 564(b)(1) of the Act, 21 U.S.C.section 360bbb-3(b)(1), unless the authorization is terminated  or revoked sooner.       Influenza A by PCR NEGATIVE NEGATIVE Final   Influenza B by PCR NEGATIVE NEGATIVE Final    Comment: (NOTE) The Xpert Xpress SARS-CoV-2/FLU/RSV plus assay is intended as an aid in the diagnosis of influenza from Nasopharyngeal swab specimens and should not be used as a sole basis for treatment. Nasal washings and aspirates are unacceptable for Xpert Xpress SARS-CoV-2/FLU/RSV testing.  Fact Sheet for Patients: EntrepreneurPulse.com.au  Fact Sheet for Healthcare Providers: IncredibleEmployment.be  This test is not yet approved or cleared by the [place] FDA and has been authorized for detection and/or diagnosis  of SARS-CoV-2 by FDA under an Emergency Use Authorization (EUA). This EUA will remain in effect (meaning this test can be used) for the duration of the COVID-19 declaration under Section 564(b)(1) of the Act, 21 U.S.C. section 360bbb-3(b)(1), unless the authorization is terminated or revoked.  Performed at [facility], [place] [address], [place], [place] [address]   MRSA Next Gen by PCR, Nasal     Status: None   Collection Time: 09/27/20  8:31 PM   Specimen: Nasal Mucosa; Nasal Swab  Result Value Ref Range Status   MRSA by PCR Next Gen NOT DETECTED NOT DETECTED Final    Comment: (NOTE) The GeneXpert MRSA Assay (FDA approved for NASAL specimens only), is one component of a comprehensive MRSA colonization surveillance program. It is not intended to diagnose MRSA infection nor to guide or monitor treatment for MRSA infections. Test performance is not FDA approved in patients less than 38 years old. Performed at [facility], [place] [address] [place], [place] [address]   Urine Culture     Status: Abnormal (Preliminary result)   Collection Time: 10/03/20  2:52 PM   Specimen: Urine, Clean Catch  Result Value Ref Range Status   Specimen Description URINE, CLEAN CATCH  Final   Special Requests NONE  Final   Culture (A)  Final    20,000 COLONIES/mL GRAM NEGATIVE RODS CULTURE REINCUBATED FOR BETTER GROWTH Performed at [facility], [place] [address], [place], [place] [address]    Report Status PENDING  Incomplete      Studies: No results found.   Flora Lipps, MD  [facility] 10/05/2020  If 7PM-7AM, please contact night-coverage

## 2020-10-05 NOTE — Progress Notes (Signed)
NG tube remains in place. Pt on 2lt [place]

## 2020-10-06 DIAGNOSIS — Z7189 Other specified counseling: Secondary | ICD-10-CM

## 2020-10-06 DIAGNOSIS — I63412 Cerebral infarction due to embolism of left middle cerebral artery: Secondary | ICD-10-CM | POA: Diagnosis not present

## 2020-10-06 DIAGNOSIS — Z515 Encounter for palliative care: Secondary | ICD-10-CM

## 2020-10-06 DIAGNOSIS — R638 Other symptoms and signs concerning food and fluid intake: Secondary | ICD-10-CM

## 2020-10-06 LAB — CBC
HCT: 34.7 % — ABNORMAL LOW (ref 36.0–46.0)
Hemoglobin: 10.6 g/dL — ABNORMAL LOW (ref 12.0–15.0)
MCH: 28.6 pg (ref 26.0–34.0)
MCHC: 30.5 g/dL (ref 30.0–36.0)
MCV: 93.8 fL (ref 80.0–100.0)
Platelets: 346 10*3/uL (ref 150–400)
RBC: 3.7 MIL/uL — ABNORMAL LOW (ref 3.87–5.11)
RDW: 15.4 % (ref 11.5–15.5)
WBC: 13 10*3/uL — ABNORMAL HIGH (ref 4.0–10.5)
nRBC: 0 % (ref 0.0–0.2)

## 2020-10-06 LAB — BASIC METABOLIC PANEL
Anion gap: 11 (ref 5–15)
BUN: 87 mg/dL — ABNORMAL HIGH (ref 8–23)
CO2: 28 mmol/L (ref 22–32)
Calcium: 9.2 mg/dL (ref 8.9–10.3)
Chloride: 107 mmol/L (ref 98–111)
Creatinine, Ser: 3.14 mg/dL — ABNORMAL HIGH (ref 0.44–1.00)
GFR, Estimated: 15 mL/min — ABNORMAL LOW (ref >60)
Glucose, Bld: 62 mg/dL — ABNORMAL LOW (ref 70–99)
Potassium: 4.3 mmol/L (ref 3.5–5.1)
Sodium: 146 mmol/L — ABNORMAL HIGH (ref 135–145)

## 2020-10-06 LAB — GLUCOSE, CAPILLARY
Glucose-Capillary: 128 mg/dL — ABNORMAL HIGH (ref 70–99)
Glucose-Capillary: 132 mg/dL — ABNORMAL HIGH (ref 70–99)
Glucose-Capillary: 144 mg/dL — ABNORMAL HIGH (ref 70–99)
Glucose-Capillary: 153 mg/dL — ABNORMAL HIGH (ref 70–99)
Glucose-Capillary: 160 mg/dL — ABNORMAL HIGH (ref 70–99)
Glucose-Capillary: 163 mg/dL — ABNORMAL HIGH (ref 70–99)
Glucose-Capillary: 60 mg/dL — ABNORMAL LOW (ref 70–99)

## 2020-10-06 LAB — URINE CULTURE: Culture: 20000 — AB

## 2020-10-06 LAB — HEPARIN LEVEL (UNFRACTIONATED): Heparin Unfractionated: 0.43 IU/mL (ref 0.30–0.70)

## 2020-10-06 LAB — MAGNESIUM: Magnesium: 2.4 mg/dL (ref 1.7–2.4)

## 2020-10-06 MED ORDER — GLUCAGON HCL RDNA (DIAGNOSTIC) 1 MG IJ SOLR
INTRAMUSCULAR | Status: AC
  Administered 2020-10-06: 1 mg
  Filled 2020-10-06: qty 1

## 2020-10-06 MED ORDER — AMOXICILLIN 250 MG PO CAPS
250.0000 mg | ORAL_CAPSULE | Freq: Two times a day (BID) | ORAL | Status: AC
  Administered 2020-10-06 – 2020-10-08 (×6): 250 mg via ORAL
  Filled 2020-10-06 (×6): qty 1

## 2020-10-06 MED ORDER — SPIRONOLACTONE 12.5 MG HALF TABLET
12.5000 mg | ORAL_TABLET | Freq: Every day | ORAL | Status: DC
  Administered 2020-10-06 – 2020-10-09 (×4): 12.5 mg via ORAL
  Filled 2020-10-06 (×4): qty 1

## 2020-10-06 MED ORDER — APIXABAN 5 MG PO TABS
5.0000 mg | ORAL_TABLET | Freq: Two times a day (BID) | ORAL | Status: DC
Start: 2020-10-06 — End: 2020-10-09
  Administered 2020-10-06 – 2020-10-09 (×6): 5 mg via ORAL
  Filled 2020-10-06 (×7): qty 1

## 2020-10-06 NOTE — Progress Notes (Signed)
ANTICOAGULATION CONSULT NOTE - Follow Up Consult  Pharmacy Consult for Heparin Indication: Afib / new CVA  Allergies  Allergen Reactions   Ace Inhibitors Swelling    Mouth area swelling   Atorvastatin Diarrhea and Other (See Comments)    Severe diarrhea   Benicar [Olmesartan] Swelling and Other (See Comments)    Angioedema   Epinephrine Hcl Hives        Levaquin [Levofloxacin In D5w] Swelling and Other (See Comments)    Angioedema    Losartan Swelling    Patient Measurements: Height: 5\' 3"  (160 cm) Weight: 94.4 kg (208 lb 1.8 oz) IBW/kg (Calculated) : 52.4   Vital Signs: Temp: 98.3 F (36.8 C) (08/06 1219) Temp Source: Oral (08/06 0838) BP: 168/78 (08/06 1219) Pulse Rate: 95 (08/06 1219)  Labs: Recent Labs    10/04/20 0020 10/04/20 0229 10/04/20 1054 10/05/20 0358 10/06/20 0318  HGB 10.8*  --   --  10.9* 10.6*  HCT 34.4*  --   --  34.3* 34.7*  PLT 326  --   --  333 346  HEPARINUNFRC  --    < > 0.39 0.42 0.43  CREATININE 3.86*  --   --  3.39* 3.14*   < > = values in this interval not displayed.     Estimated Creatinine Clearance: 18.2 mL/min (A) (by C-G formula based on SCr of 3.14 mg/dL (H)).   Assessment: 70 year old female to begin heparin for Afib and new CVA 7/28 with thrombectomy 7/29.  Heparin level remains therapeutic on 1150 units/hr. CBC stable. No s/sx of bleeding or infusion issues. Initial plan was for apixaban when PO intake is adequate however now considering SNF with palliative care.  Reached out to MD to clarify long-term anticoagulation plans with patient/family.  Goal of Therapy:  Heparin level 0.3 to 0.5 Monitor platelets by anticoagulation protocol: Yes   Plan:  Cont heparin 1150 units / hr Daily heparin level, CBC Follow-up plans for long term anticoagulation in the setting of palliative care.  [employer], Pharm.D., BCPS Clinical Pharmacist  10/06/2020 1:41 PM

## 2020-10-06 NOTE — Progress Notes (Addendum)
Daily Progress Note   Patient Name: Kathleen [name]       Date: 10/06/2020 DOB: 1951/02/24  Age: 70 y.o. MRN#: [id number] Attending Physician: Donnamae Jude, MD Primary Care Physician: Kristen Loader, FNP Admit Date: 09/27/2020 Length of Stay: 9 days  Reason for Consultation/Follow-up: Establishing goals of care  HPI/Patient Profile:  70 y.o. female  with past medical history of CKD stage IV, diabetes mellitus type 2, chronic diastolic heart failure, hypertension, hyperlipidemia, OSA on CPAP, morbid obesity admitted on 09/27/2020 with CODE STROKE.  Patient presented with acute right hemiplegia with right facial droop and leftward gaze. She was admitted under stroke team status post thrombectomy of left M1 occlusion. Patient was extubated post procedure and able to follow commands but subsequently 729 afternoon she had decreased mentation and labored breathing and was unable to protect her airway.  Critical care was consulted and patient underwent intubation and mechanical ventilation in ICU admission.  Subsequently, patient was extubated and was considered stable for transfer out of the ICU.   CoreTrak placed for nutrition, SLP eval along with PT/OT. Recommended Dysphagia 1 diet. Concern for ability to get adequate intake to support needs. Pending placement at SNF for rehab. Currently aphasia, follows commands and shakes head yes/no. [facility]) at bedside.  Subjective:   Subjective: Chart Reviewed. Updates received. Patient Assessed. Created space and opportunity for patient  and family to explore thoughts and feelings regarding current medical situation.  Today's Discussion: When I entered the room she was sleeping, easily awakens to voice. She states she's having a good morning. No complaints today. No pain, N/V, or dyspnea. Still aphasic, answered questions by nodding her head yes/no.  Review of Systems  Unable to perform ROS: Patient nonverbal   Objective:   Vital Signs: BP  (!) 164/74 (BP Location: Right Arm)   Pulse 66   Temp 98.2 F (36.8 C) (Oral)   Resp 18   Ht 5\' 3"  (1.6 m)   Wt 94.4 kg   SpO2 98%   BMI 36.87 kg/m  SpO2: SpO2: 98 % O2 Device: O2 Device: Room Air O2 Flow Rate: O2 Flow Rate (L/min): 2 L/min  Physical Exam: Physical Exam Vitals and nursing note reviewed.  Constitutional:      General: She is sleeping. She is not in acute distress.    Appearance: She is obese. She is not toxic-appearing.     Comments: Non-verbal/aphasic  HENT:     Head: Normocephalic and atraumatic.  Cardiovascular:     Rate and Rhythm: Normal rate and regular rhythm.     Heart sounds: No murmur heard. Pulmonary:     Effort: Pulmonary effort is normal.  Abdominal:     General: Abdomen is protuberant.     Palpations: Abdomen is soft.  Skin:    General: Skin is warm and dry.  Neurological:     Mental Status: She is easily aroused.  Psychiatric:        Mood and Affect: Mood normal.        Behavior: Behavior normal.    SpO2: SpO2: 98 % O2 Device:SpO2: 98 % O2 Flow Rate: .O2 Flow Rate (L/min): 2 L/min  Palliative Assessment/Data: 50%   Assessment & Plan:   Impression:  70 year old female patient with admission for large CVA as described above. Significant deficits: one-sided weakness, aphasia, dysphagia. Currently with a CoreTrak in place that will need to be d/c in no more than another week or at d/c to SNF. Cleared  for dysphagia 1 diet. Patient's HCPOA feels she would not want life-prolonging measures (including hospital readmission, PEG tube, parenteral antibiotics, IVF) given decreased quality of life. The patient does nod her head in agreement to this when asked. They would like trial of SNF/rehab with OP palliative care near the sister's home in [facility] and, if the patient doesn't progress or refuses therapy, planned transition to LTC with hospice.  SUMMARY OF RECOMMENDATIONS   MOST form completed: no PEG, no IV antibiotics, no IVF No  hospital readmission unless unable to manage comfort Otherwise treat the treatable Ok to pull CoreTrak when needed, even if inadequate oral intake Trial of SNF rehab/palliative care, can consider transition to LTC/hospice pending her progress Will continue to follow her chart for any changes/additional needs  Code Status: DNR  Symptom Management: Per primary team Palliative is available to assist with symptom management if needed  Prognosis: < 6 months  Discharge Planning: [facility] for rehab with Palliative care service follow-up  Discussed with: Patient, family, nursing staff, medical team  Thank you for allowing [place] to participate in the care of Kathleen [name] PMT will continue to support holistically.  Time Total: 30 mins  Visit consisted of counseling and education dealing with the complex and emotionally intense issues of symptom management and palliative care in the setting of serious and potentially life-threatening illness. Greater than 50%  of this time was spent counseling and coordinating care related to the above assessment and plan.  Walden Field, NP Palliative Medicine Team  Team Phone # [phone] (Nights/Weekends)  10/06/2020, 8:43 AM

## 2020-10-06 NOTE — Progress Notes (Addendum)
Patient ID: Kathleen [name], female   DOB: December 28, 1950, 70 y.o.   MRN: [id number]  PROGRESS NOTE    BELYNDA PAGADUAN  IWP:[id number] DOB: 1950/04/03 DOA: 09/27/2020 PCP: Kristen Loader, FNP    Brief Narrative:  Kathleen [name] is a 70 y.o. female with past medical history of CKD stage IV, diabetes mellitus type 2, chronic diastolic heart failure, hypertension, hyperlipidemia, OSA on CPAP, morbid obesity presented to the hospital as a code stroke.  Patient presented with acute right hemiplegia with right facial droop and leftward gaze. She was admitted under stroke team status post thrombectomy of left M1 occlusion. Patient was extubated post procedure and able to follow commands but subsequently 729 afternoon she had decreased mentation and labored breathing and was unable to protect her airway.  Critical care was consulted and patient underwent intubation and mechanical ventilation in ICU admission.  Subsequently, patient was extubated and was considered stable for transfer out of the ICU.   Assessment & Plan:   Principal Problem:   Stroke (cerebrum) (HCC) Active Problems:   Essential hypertension   OSA (obstructive sleep apnea)   Obesity (BMI 30-39.9)   CRI (chronic renal insufficiency), stage 3 (moderate) (HCC)   Pressure injury of skin   Dysphagia   Palliative care by specialist   Goals of care, counseling/discussion   Inadequate oral intake  Acute left MCA stroke s/p thrombectomy/ dysphagia. Thought to be cardioembolic due to new onset atrial fibrillation.  Neurology saw the patient. Patient initially on heparin drip, transition to Eliquis.  Speech therapy has seen the patient and advised dysphagia 1 diet.  Still on cortrak tube tube.  Get calorie count.  Will need to be encouraged on oral intake.  Patient's family does not wish the patient to be on PEG tube.  We will continue to monitor closely.  Palliative care has been consulted.  Skilled therapy has been consulted who recommended  CIR placement.   Stroke team has signed off.   Acute hypoxemic respiratory failure/Hx of OSA on CPAP Initially intubated for airway protection.  Subsequently was on BiPAP.  Oxygen was subsequently weaned off.  Continue pulmonary hygiene.  Avoid oversedation.   UTI   Initially on Rocephin changed to ampicillin due to Enterococcus which was sensitive to this.   Oliguric acute kidney injury on CKD stage 3b Baseline creatinine around 2.6.  Peak of 4.04 Today's value was 3.14 and trending down Nephrology consulted and signed off Avoid nephrotoxic agents   Hyperthyroidism.  Continue methimazole.  Heart rate controlled.    Chronic diastolic CHF Compensated.  Continue to monitor.   Hypertension/hyperlipidemia On amlodipine and Coreg.  Coreg increased today to improve blood pressure control   Proximal A. fib, new onset 2D echocardiogram showed EF 55-60% with grade II diastolic dysfunction  50/5397. Continue Norvasc, Lipitor, Coreg, Eliquis   Diabetes type 2 with hyperglycemia Continue sliding scale insulin, Accu-Cheks, diabetic diet.  We will continue to hold oral hypoglycemic agents.   Goals of care Palliative care on board.  At this time no aggressive intervention planned including PEG tube feeding.  Plan for skilled nursing facility with palliative care..   DVT prophylaxis: SCD/Compression stockings Code Status: DNR  Family Communication: None at bedside Disposition Plan: SNF with palliative care Patient remains inpatient due to unsafe discharge plan   Consultants:  Critical care Neurology Palliative care Nephrology  Procedures: Intubation Thrombectomy via IR on 729 BiPAP placement  Antimicrobials: Anti-infectives (From admission, onward)    Start  Dose/Rate Route Frequency Ordered Stop   10/06/20 1430  amoxicillin (AMOXIL) capsule 250 mg        250 mg Oral Every 12 hours 10/06/20 1336 10/09/20 0959   10/03/20 1600  cefTRIAXone (ROCEPHIN) 1 g in sodium chloride  0.9 % 100 mL IVPB  Status:  Discontinued        1 g 200 mL/hr over 30 Minutes Intravenous Every 24 hours 10/03/20 1452 10/06/20 1336   10/03/20 1545  cefTRIAXone (ROCEPHIN) 1 g in sodium chloride 0.9 % 100 mL IVPB  Status:  Discontinued        1 g 200 mL/hr over 30 Minutes Intravenous Every 24 hours 10/03/20 1451 10/03/20 1452        Subjective: Patient was awake and alert, aphasic  Objective: Vitals:   10/06/20 0350 10/06/20 0500 10/06/20 0838 10/06/20 1219  BP: (!) 132/110  (!) 164/74 (!) 168/78  Pulse: 63  66 95  Resp: 18  18 16   Temp: 97.8 F (36.6 C)  98.2 F (36.8 C) 98.3 F (36.8 C)  TempSrc:   Oral   SpO2: 98%  98% 100%  Weight:  94.4 kg    Height:        Intake/Output Summary (Last 24 hours) at 10/06/2020 1445 Last data filed at 10/06/2020 1320 Gross per 24 hour  Intake 386.06 ml  Output 1100 ml  Net -713.94 ml   Filed Weights   10/02/20 0341 10/04/20 0419 10/06/20 0500  Weight: 82.2 kg 84.1 kg 94.4 kg    Examination:  General exam: Appears calm and comfortable  Respiratory system: Clear to auscultation. Respiratory effort normal. Cardiovascular system: S1 & S2 heard, irregularly irregular.  Gastrointestinal system: Abdomen is nondistended, soft and nontender.  Central nervous system: Right-sided weakness Extremities: Symmetric  Skin: No rashes  Data Reviewed: I have personally reviewed following labs and imaging studies  CBC: Recent Labs  Lab 10/02/20 0244 10/03/20 0210 10/04/20 0020 10/05/20 0358 10/06/20 0318  WBC 9.4 7.4 7.5 9.2 13.0*  HGB 10.8* 11.6* 10.8* 10.9* 10.6*  HCT 34.0* 35.3* 34.4* 34.3* 34.7*  MCV 91.2 89.4 91.2 92.2 93.8  PLT 296 293 326 333 300   Basic Metabolic Panel: Recent Labs  Lab 09/29/20 1639 09/30/20 0133 10/01/20 0146 10/02/20 0244 10/03/20 0210 10/04/20 0020 10/05/20 0358 10/06/20 0318  NA  --  140   < > 142 142 142 146* 146*  K  --  4.6   < > 4.3 4.1 4.5 4.3 4.3  CL  --  104   < > 106 105 105 107 107   CO2  --  24   < > 25 26 25 31 28   GLUCOSE  --  229*   < > 132* 285* 283* 196* 62*  BUN  --  66*   < > 89* 88* 91* 93* 87*  CREATININE  --  3.67*   < > 4.13* 4.02* 3.86* 3.39* 3.14*  CALCIUM  --  7.9*   < > 8.1* 8.2* 8.3* 8.7* 9.2  MG 2.0 2.1  --   --  2.2 2.1 2.4 2.4  PHOS 4.3  --   --   --   --  3.9  --   --    < > = values in this interval not displayed.   GFR: Estimated Creatinine Clearance: 18.2 mL/min (A) (by C-G formula based on SCr of 3.14 mg/dL (H)). Liver Function Tests: Recent Labs  Lab 10/04/20 0020  AST 18  ALT 9  ALKPHOS 85  BILITOT 0.6  PROT 5.1*  ALBUMIN 2.1*    CBG: Recent Labs  Lab 10/05/20 2056 10/06/20 0351 10/06/20 0441 10/06/20 0951 10/06/20 1221  GLUCAP 140* 60* 144* 160* 163*    Sepsis Labs:   Recent Results (from the past 240 hour(s))  Resp Panel by RT-PCR (Flu A&B, Covid) Nasopharyngeal Swab     Status: None   Collection Time: 09/27/20  1:56 PM   Specimen: Nasopharyngeal Swab; Nasopharyngeal(NP) swabs in vial transport medium  Result Value Ref Range Status   SARS Coronavirus 2 by RT PCR NEGATIVE NEGATIVE Final    Comment: (NOTE) SARS-CoV-2 target nucleic acids are NOT DETECTED.  The SARS-CoV-2 RNA is generally detectable in upper respiratory specimens during the acute phase of infection. The lowest concentration of SARS-CoV-2 viral copies this assay can detect is 138 copies/mL. A negative result does not preclude SARS-Cov-2 infection and should not be used as the sole basis for treatment or other patient management decisions. A negative result may occur with  improper specimen collection/handling, submission of specimen other than nasopharyngeal swab, presence of viral mutation(s) within the areas targeted by this assay, and inadequate number of viral copies(<138 copies/mL). A negative result must be combined with clinical observations, patient history, and epidemiological information. The expected result is Negative.  Fact Sheet  for Patients:  EntrepreneurPulse.com.au  Fact Sheet for Healthcare Providers:  IncredibleEmployment.be  This test is no t yet approved or cleared by the [place] FDA and  has been authorized for detection and/or diagnosis of SARS-CoV-2 by FDA under an Emergency Use Authorization (EUA). This EUA will remain  in effect (meaning this test can be used) for the duration of the COVID-19 declaration under Section 564(b)(1) of the Act, 21 U.S.C.section 360bbb-3(b)(1), unless the authorization is terminated  or revoked sooner.       Influenza A by PCR NEGATIVE NEGATIVE Final   Influenza B by PCR NEGATIVE NEGATIVE Final    Comment: (NOTE) The Xpert Xpress SARS-CoV-2/FLU/RSV plus assay is intended as an aid in the diagnosis of influenza from Nasopharyngeal swab specimens and should not be used as a sole basis for treatment. Nasal washings and aspirates are unacceptable for Xpert Xpress SARS-CoV-2/FLU/RSV testing.  Fact Sheet for Patients: EntrepreneurPulse.com.au  Fact Sheet for Healthcare Providers: IncredibleEmployment.be  This test is not yet approved or cleared by the [place] FDA and has been authorized for detection and/or diagnosis of SARS-CoV-2 by FDA under an Emergency Use Authorization (EUA). This EUA will remain in effect (meaning this test can be used) for the duration of the COVID-19 declaration under Section 564(b)(1) of the Act, 21 U.S.C. section 360bbb-3(b)(1), unless the authorization is terminated or revoked.  Performed at [facility], [place] [address], [place], [place] [address]   MRSA Next Gen by PCR, Nasal     Status: None   Collection Time: 09/27/20  8:31 PM   Specimen: Nasal Mucosa; Nasal Swab  Result Value Ref Range Status   MRSA by PCR Next Gen NOT DETECTED NOT DETECTED Final    Comment: (NOTE) The GeneXpert MRSA Assay (FDA approved for NASAL specimens only), is one  component of a comprehensive MRSA colonization surveillance program. It is not intended to diagnose MRSA infection nor to guide or monitor treatment for MRSA infections. Test performance is not FDA approved in patients less than 49 years old. Performed at [facility], [place] [address], [place], [place] [address]   Urine Culture     Status:  Abnormal   Collection Time: 10/03/20  2:52 PM   Specimen: Urine, Clean Catch  Result Value Ref Range Status   Specimen Description URINE, CLEAN CATCH  Final   Special Requests   Final    NONE Performed at [facility], [place] [address], [place], [place] [address]    Culture (A)  Final    20,000 COLONIES/mL ESCHERICHIA COLI >=100,000 COLONIES/mL ENTEROCOCCUS FAECALIS    Report Status 10/06/2020 FINAL  Final   Organism ID, Bacteria ESCHERICHIA COLI (A)  Final   Organism ID, Bacteria ENTEROCOCCUS FAECALIS (A)  Final      Susceptibility   Escherichia coli - MIC*    AMPICILLIN 8 SENSITIVE Sensitive     CEFAZOLIN <=4 SENSITIVE Sensitive     CEFEPIME <=0.12 SENSITIVE Sensitive     CEFTRIAXONE <=0.25 SENSITIVE Sensitive     CIPROFLOXACIN <=0.25 SENSITIVE Sensitive     GENTAMICIN <=1 SENSITIVE Sensitive     IMIPENEM <=0.25 SENSITIVE Sensitive     NITROFURANTOIN <=16 SENSITIVE Sensitive     TRIMETH/SULFA <=20 SENSITIVE Sensitive     AMPICILLIN/SULBACTAM 4 SENSITIVE Sensitive     PIP/TAZO <=4 SENSITIVE Sensitive     * 20,000 COLONIES/mL ESCHERICHIA COLI   Enterococcus faecalis - MIC*    AMPICILLIN <=2 SENSITIVE Sensitive     NITROFURANTOIN <=16 SENSITIVE Sensitive     VANCOMYCIN 2 SENSITIVE Sensitive     * >=100,000 COLONIES/mL ENTEROCOCCUS FAECALIS      Radiology Studies: No results found.   Scheduled Meds:  amLODipine  10 mg Per Tube Daily   amoxicillin  250 mg Oral Q12H   apixaban  5 mg Oral BID   atorvastatin  80 mg Per Tube Daily   brimonidine  1 drop Both Eyes BID   carvedilol  6.25 mg Per Tube BID WC   chlorhexidine  15  mL Mouth Rinse BID   Chlorhexidine Gluconate Cloth  6 each Topical Daily   insulin aspart  0-20 Units Subcutaneous Q4H   insulin aspart  5 Units Subcutaneous Q4H   insulin detemir  13 Units Subcutaneous BID   latanoprost  1 drop Both Eyes QHS   mouth rinse  15 mL Mouth Rinse q12n4p   methimazole  5 mg Per Tube Daily   pantoprazole sodium  40 mg Per Tube QHS   polyethylene glycol  17 g Per Tube Daily   spironolactone  12.5 mg Oral Daily   timolol  1 drop Both Eyes Daily   Continuous Infusions:   LOS: 9 days    Donnamae Jude, MD 10/06/2020 2:45 PM [phone] [facility] If 7PM-7AM, please contact night-coverage 10/06/2020, 2:45 PM

## 2020-10-06 NOTE — Progress Notes (Signed)
Patient converted from NSR to A-fib this afternoon, with HR in the 80's. MD Kennon Rounds made aware.

## 2020-10-07 DIAGNOSIS — I480 Paroxysmal atrial fibrillation: Secondary | ICD-10-CM

## 2020-10-07 DIAGNOSIS — E119 Type 2 diabetes mellitus without complications: Secondary | ICD-10-CM

## 2020-10-07 DIAGNOSIS — Z794 Long term (current) use of insulin: Secondary | ICD-10-CM

## 2020-10-07 LAB — CBC
HCT: 36.5 % (ref 36.0–46.0)
Hemoglobin: 11.4 g/dL — ABNORMAL LOW (ref 12.0–15.0)
MCH: 28.8 pg (ref 26.0–34.0)
MCHC: 31.2 g/dL (ref 30.0–36.0)
MCV: 92.2 fL (ref 80.0–100.0)
Platelets: 340 10*3/uL (ref 150–400)
RBC: 3.96 MIL/uL (ref 3.87–5.11)
RDW: 15.2 % (ref 11.5–15.5)
WBC: 14.9 10*3/uL — ABNORMAL HIGH (ref 4.0–10.5)
nRBC: 0 % (ref 0.0–0.2)

## 2020-10-07 LAB — GLUCOSE, CAPILLARY
Glucose-Capillary: 76 mg/dL (ref 70–99)
Glucose-Capillary: 76 mg/dL (ref 70–99)
Glucose-Capillary: 188 mg/dL — ABNORMAL HIGH (ref 70–99)
Glucose-Capillary: 196 mg/dL — ABNORMAL HIGH (ref 70–99)
Glucose-Capillary: 172 mg/dL — ABNORMAL HIGH (ref 70–99)
Glucose-Capillary: 149 mg/dL — ABNORMAL HIGH (ref 70–99)

## 2020-10-07 MED ORDER — INSULIN DETEMIR 100 UNIT/ML ~~LOC~~ SOLN
5.0000 [IU] | Freq: Two times a day (BID) | SUBCUTANEOUS | Status: DC
  Administered 2020-10-07 – 2020-10-08 (×3): 5 [IU] via SUBCUTANEOUS
  Filled 2020-10-07 (×6): qty 0.05

## 2020-10-07 MED ORDER — INSULIN ASPART 100 UNIT/ML IJ SOLN
0.0000 [IU] | Freq: Three times a day (TID) | INTRAMUSCULAR | Status: DC
Start: 2020-10-07 — End: 2020-10-09
  Administered 2020-10-07: 2 [IU] via SUBCUTANEOUS
  Administered 2020-10-08: 3 [IU] via SUBCUTANEOUS
  Administered 2020-10-08: 5 [IU] via SUBCUTANEOUS
  Administered 2020-10-08 – 2020-10-09 (×2): 7 [IU] via SUBCUTANEOUS

## 2020-10-07 NOTE — Progress Notes (Signed)
Pt not requiring BiPAP at this time vitals and sat are stable. Will continue to monitor.

## 2020-10-07 NOTE — Progress Notes (Signed)
PROGRESS NOTE    Kathleen [name]  JKK:[id number] DOB: [birth year] DOA: 09/27/2020 PCP: Kristen Loader, FNP    Chief Complaint  Patient presents with   Code Stroke    Brief Narrative:  Kathleen [name] is a 70 y.o. female with past medical history of CKD stage IV, diabetes mellitus type 2, chronic diastolic heart failure, hypertension, hyperlipidemia, OSA on CPAP, morbid obesity presented to the hospital as a code stroke.  Patient presented with acute right hemiplegia with right facial droop and leftward gaze. She was admitted under stroke team status post thrombectomy of left M1 occlusion. Patient was extubated post procedure and able to follow commands but subsequently 729 afternoon she had decreased mentation and labored breathing and was unable to protect her airway.  Critical care was consulted and patient underwent intubation and mechanical ventilation in ICU admission.  Subsequently, patient was extubated and was considered stable for transfer out of the ICU.  Subjective:  She is alert, following command, but has aphasia She is off tube feeds, with poor oral intake, blood glucose low this am, calorie count is underway She is on two liter oxygen, per rn she detats on room air, she did not wear cpap last night  Assessment & Plan:   Principal Problem:   Stroke (cerebrum) ([place]) Active Problems:   Essential hypertension   OSA (obstructive sleep apnea)   Obesity (BMI 30-39.9)   CRI (chronic renal insufficiency), stage 3 (moderate) (HCC)   Pressure injury of skin   Dysphagia   Palliative care by specialist   Goals of care, counseling/discussion   Inadequate oral intake     Acute left MCA stroke status post thrombectomy/dysphagia Thought to be cardioembolic due to new onset atrial fibrillation.  Neurology saw the patient. Patient initially on heparin drip, transition to Eliquis.  Speech therapy has seen the patient and advised dysphagia 1 diet -Resolved core track tube, has  poor oral intake, getting caloric count Family do not wish to proceed on PEG tube placement Palliative care following   Acute hypoxic respiratory failure Was intubated for airway protection Continue nighttime CPAP Now on 2liter, continue wean  Paroxysmal A. Fib, new diagnosis On Coreg Eliquis  Chronic diastolic CHF Appear compensated, currently with poor oral intake Monitor volume status On spironolactone at home which is continued  Insulin-dependent type 2 diabetes, uncontrolled, A1c 7.8% With hyperglycemia and hypoglycemia A.m. blood glucose 62, decrease insulin    UTI   Initially on Rocephin changed to ampicillin due to Enterococcus which was sensitive to this.   Oliguric acute kidney injury on CKD stage 3b Baseline creatinine around 2.6.  Peak of 4.04 Nephrology consulted and signed off Avoid nephrotoxic agents    The patient's BMI is: Body mass index is 36.87 kg/m.Marland Kitchen Seen by dietician.  I agree with the assessment and plan as outlined below:  Nutrition Status: Nutrition Problem: Inadequate oral intake Etiology: inability to eat Signs/Symptoms: NPO status Interventions: Tube feeding  .     Unresulted Labs (From admission, onward)     Start     Ordered   10/04/20 0500  CBC  Daily,   R     Question:  Specimen collection method  Answer:  Lab=Lab collect   10/02/20 1542              DVT prophylaxis: SCD's Start: 09/27/20 1528 apixaban (ELIQUIS) tablet 5 mg   Code Status: DNR Family Communication: None at bedside Disposition:   Status is: Inpatient  Dispo: The patient  is from: Home              Anticipated d/c is to: SNF with palliative care              Anticipated d/c date is: Likely Monday pending bed availability               Consultants:  Neurology Critical care Palliative care Nephrology IR  Procedures:  Intubation extubation Status post ultrasound guided access right common femoral artery for left-sided cervical/cerebral  angiogram and mechanical thrombectomy of left MCA occlusion on 7/28  Antimicrobials:    Anti-infectives (From admission, onward)    Start     Dose/Rate Route Frequency Ordered Stop   10/06/20 1430  amoxicillin (AMOXIL) capsule 250 mg        250 mg Oral Every 12 hours 10/06/20 1336 10/09/20 0959   10/03/20 1600  cefTRIAXone (ROCEPHIN) 1 g in sodium chloride 0.9 % 100 mL IVPB  Status:  Discontinued        1 g 200 mL/hr over 30 Minutes Intravenous Every 24 hours 10/03/20 1452 10/06/20 1336   10/03/20 1545  cefTRIAXone (ROCEPHIN) 1 g in sodium chloride 0.9 % 100 mL IVPB  Status:  Discontinued        1 g 200 mL/hr over 30 Minutes Intravenous Every 24 hours 10/03/20 1451 10/03/20 1452          Objective: Vitals:   10/06/20 2018 10/07/20 0000 10/07/20 0431 10/07/20 0830  BP: (!) 154/69 (!) 141/73 (!) 156/87 (!) 181/88  Pulse: 69 80 74 70  Resp: 20 18 18 18   Temp: 97.6 F (36.4 C) (!) 97.4 F (36.3 C) 98 F (36.7 C) 97.7 F (36.5 C)  TempSrc:  Oral  Oral  SpO2: 96% 98% 97% 100%  Weight:      Height:        Intake/Output Summary (Last 24 hours) at 10/07/2020 1145 Last data filed at 10/07/2020 0900 Gross per 24 hour  Intake 720 ml  Output 950 ml  Net -230 ml   Filed Weights   10/02/20 0341 10/04/20 0419 10/06/20 0500  Weight: 82.2 kg 84.1 kg 94.4 kg    Examination:  General exam: frail , aphasic but following commands Respiratory system: diminished,  Respiratory effort normal. Cardiovascular system: S1 & S2 heard, RRR. No JVD, no murmur, No pedal edema. Gastrointestinal system: Abdomen is nondistended, soft and nontender.  Normal bowel sounds heard. Central nervous system: Alert , aphasic, following commands Extremities: right side weakness, no edema Skin: No rashes, lesions or ulcers Psychiatry: aphasic, calm, no agitation.     Data Reviewed: I have personally reviewed following labs and imaging studies  CBC: Recent Labs  Lab 10/03/20 0210 10/04/20 0020  10/05/20 0358 10/06/20 0318 10/07/20 0404  WBC 7.4 7.5 9.2 13.0* 14.9*  HGB 11.6* 10.8* 10.9* 10.6* 11.4*  HCT 35.3* 34.4* 34.3* 34.7* 36.5  MCV 89.4 91.2 92.2 93.8 92.2  PLT 293 326 333 346 938    Basic Metabolic Panel: Recent Labs  Lab 10/02/20 0244 10/03/20 0210 10/04/20 0020 10/05/20 0358 10/06/20 0318  NA 142 142 142 146* 146*  K 4.3 4.1 4.5 4.3 4.3  CL 106 105 105 107 107  CO2 25 26 25 31 28   GLUCOSE 132* 285* 283* 196* 62*  BUN 89* 88* 91* 93* 87*  CREATININE 4.13* 4.02* 3.86* 3.39* 3.14*  CALCIUM 8.1* 8.2* 8.3* 8.7* 9.2  MG  --  2.2 2.1 2.4 2.4  PHOS  --   --  3.9  --   --     GFR: Estimated Creatinine Clearance: 18.2 mL/min (A) (by C-G formula based on SCr of 3.14 mg/dL (H)).  Liver Function Tests: Recent Labs  Lab 10/04/20 0020  AST 18  ALT 9  ALKPHOS 85  BILITOT 0.6  PROT 5.1*  ALBUMIN 2.1*    CBG: Recent Labs  Lab 10/06/20 1559 10/06/20 2016 10/06/20 2354 10/07/20 0430 10/07/20 0838  GLUCAP 128* 153* 132* 76 76     Recent Results (from the past 240 hour(s))  Resp Panel by RT-PCR (Flu A&B, Covid) Nasopharyngeal Swab     Status: None   Collection Time: 09/27/20  1:56 PM   Specimen: Nasopharyngeal Swab; Nasopharyngeal(NP) swabs in vial transport medium  Result Value Ref Range Status   SARS Coronavirus 2 by RT PCR NEGATIVE NEGATIVE Final    Comment: (NOTE) SARS-CoV-2 target nucleic acids are NOT DETECTED.  The SARS-CoV-2 RNA is generally detectable in upper respiratory specimens during the acute phase of infection. The lowest concentration of SARS-CoV-2 viral copies this assay can detect is 138 copies/mL. A negative result does not preclude SARS-Cov-2 infection and should not be used as the sole basis for treatment or other patient management decisions. A negative result may occur with  improper specimen collection/handling, submission of specimen other than nasopharyngeal swab, presence of viral mutation(s) within the areas targeted  by this assay, and inadequate number of viral copies(<138 copies/mL). A negative result must be combined with clinical observations, patient history, and epidemiological information. The expected result is Negative.  Fact Sheet for Patients:  EntrepreneurPulse.com.au  Fact Sheet for Healthcare Providers:  IncredibleEmployment.be  This test is no t yet approved or cleared by the [place] FDA and  has been authorized for detection and/or diagnosis of SARS-CoV-2 by FDA under an Emergency Use Authorization (EUA). This EUA will remain  in effect (meaning this test can be used) for the duration of the COVID-19 declaration under Section 564(b)(1) of the Act, 21 U.S.C.section 360bbb-3(b)(1), unless the authorization is terminated  or revoked sooner.       Influenza A by PCR NEGATIVE NEGATIVE Final   Influenza B by PCR NEGATIVE NEGATIVE Final    Comment: (NOTE) The Xpert Xpress SARS-CoV-2/FLU/RSV plus assay is intended as an aid in the diagnosis of influenza from Nasopharyngeal swab specimens and should not be used as a sole basis for treatment. Nasal washings and aspirates are unacceptable for Xpert Xpress SARS-CoV-2/FLU/RSV testing.  Fact Sheet for Patients: EntrepreneurPulse.com.au  Fact Sheet for Healthcare Providers: IncredibleEmployment.be  This test is not yet approved or cleared by the [place] FDA and has been authorized for detection and/or diagnosis of SARS-CoV-2 by FDA under an Emergency Use Authorization (EUA). This EUA will remain in effect (meaning this test can be used) for the duration of the COVID-19 declaration under Section 564(b)(1) of the Act, 21 U.S.C. section 360bbb-3(b)(1), unless the authorization is terminated or revoked.  Performed at [facility], [place] [address] [place], [place] [address]   MRSA Next Gen by PCR, Nasal     Status: None   Collection Time: 09/27/20   8:31 PM   Specimen: Nasal Mucosa; Nasal Swab  Result Value Ref Range Status   MRSA by PCR Next Gen NOT DETECTED NOT DETECTED Final    Comment: (NOTE) The GeneXpert MRSA Assay (FDA approved for NASAL specimens only), is one component of a comprehensive MRSA colonization surveillance program. It is not intended to diagnose MRSA infection nor to guide  or monitor treatment for MRSA infections. Test performance is not FDA approved in patients less than 66 years old. Performed at [facility], [place] [address] [place], [place] [address]   Urine Culture     Status: Abnormal   Collection Time: 10/03/20  2:52 PM   Specimen: Urine, Clean Catch  Result Value Ref Range Status   Specimen Description URINE, CLEAN CATCH  Final   Special Requests   Final    NONE Performed at [facility], [place] [address], [place], [place] [address]    Culture (A)  Final    20,000 COLONIES/mL ESCHERICHIA COLI >=100,000 COLONIES/mL ENTEROCOCCUS FAECALIS    Report Status 10/06/2020 FINAL  Final   Organism ID, Bacteria ESCHERICHIA COLI (A)  Final   Organism ID, Bacteria ENTEROCOCCUS FAECALIS (A)  Final      Susceptibility   Escherichia coli - MIC*    AMPICILLIN 8 SENSITIVE Sensitive     CEFAZOLIN <=4 SENSITIVE Sensitive     CEFEPIME <=0.12 SENSITIVE Sensitive     CEFTRIAXONE <=0.25 SENSITIVE Sensitive     CIPROFLOXACIN <=0.25 SENSITIVE Sensitive     GENTAMICIN <=1 SENSITIVE Sensitive     IMIPENEM <=0.25 SENSITIVE Sensitive     NITROFURANTOIN <=16 SENSITIVE Sensitive     TRIMETH/SULFA <=20 SENSITIVE Sensitive     AMPICILLIN/SULBACTAM 4 SENSITIVE Sensitive     PIP/TAZO <=4 SENSITIVE Sensitive     * 20,000 COLONIES/mL ESCHERICHIA COLI   Enterococcus faecalis - MIC*    AMPICILLIN <=2 SENSITIVE Sensitive     NITROFURANTOIN <=16 SENSITIVE Sensitive     VANCOMYCIN 2 SENSITIVE Sensitive     * >=100,000 COLONIES/mL ENTEROCOCCUS FAECALIS         Radiology Studies: No results  found.      Scheduled Meds:  amLODipine  10 mg Per Tube Daily   amoxicillin  250 mg Oral Q12H   apixaban  5 mg Oral BID   atorvastatin  80 mg Per Tube Daily   brimonidine  1 drop Both Eyes BID   carvedilol  6.25 mg Per Tube BID WC   chlorhexidine  15 mL Mouth Rinse BID   Chlorhexidine Gluconate Cloth  6 each Topical Daily   insulin aspart  0-9 Units Subcutaneous TID WC   insulin detemir  5 Units Subcutaneous BID   latanoprost  1 drop Both Eyes QHS   mouth rinse  15 mL Mouth Rinse q12n4p   methimazole  5 mg Per Tube Daily   pantoprazole sodium  40 mg Per Tube QHS   polyethylene glycol  17 g Per Tube Daily   spironolactone  12.5 mg Oral Daily   timolol  1 drop Both Eyes Daily   Continuous Infusions:   LOS: 10 days   Time spent: 76mins Greater than 50% of this time was spent in counseling, explanation of diagnosis, planning of further management, and coordination of care.   Voice Recognition Viviann Spare dictation system was used to create this note, attempts have been made to correct errors. Please contact the author with questions and/or clarifications.   Florencia Reasons, MD PhD FACP [facility]  Available via Epic secure chat 7am-7pm for nonurgent issues Please page for urgent issues To page the attending provider between 7A-7P or the covering provider during after hours 7P-7A, please log into the web site [email or web address] and access using universal [place] password for that web site. If you do not have the password, please call the hospital operator.    10/07/2020,  11:45 AM

## 2020-10-08 LAB — RESP PANEL BY RT-PCR (FLU A&B, COVID) ARPGX2
Influenza A by PCR: NEGATIVE
Influenza B by PCR: NEGATIVE
SARS Coronavirus 2 by RT PCR: NEGATIVE

## 2020-10-08 LAB — CBC
HCT: 34.5 % — ABNORMAL LOW (ref 36.0–46.0)
Hemoglobin: 10.7 g/dL — ABNORMAL LOW (ref 12.0–15.0)
MCH: 28.7 pg (ref 26.0–34.0)
MCHC: 31 g/dL (ref 30.0–36.0)
MCV: 92.5 fL (ref 80.0–100.0)
Platelets: 366 10*3/uL (ref 150–400)
RBC: 3.73 MIL/uL — ABNORMAL LOW (ref 3.87–5.11)
RDW: 15.5 % (ref 11.5–15.5)
WBC: 14 10*3/uL — ABNORMAL HIGH (ref 4.0–10.5)
nRBC: 0 % (ref 0.0–0.2)

## 2020-10-08 LAB — GLUCOSE, CAPILLARY
Glucose-Capillary: 173 mg/dL — ABNORMAL HIGH (ref 70–99)
Glucose-Capillary: 226 mg/dL — ABNORMAL HIGH (ref 70–99)
Glucose-Capillary: 270 mg/dL — ABNORMAL HIGH (ref 70–99)
Glucose-Capillary: 318 mg/dL — ABNORMAL HIGH (ref 70–99)
Glucose-Capillary: 336 mg/dL — ABNORMAL HIGH (ref 70–99)

## 2020-10-08 MED ORDER — ENSURE ENLIVE PO LIQD
237.0000 mL | Freq: Two times a day (BID) | ORAL | Status: DC
  Administered 2020-10-08 – 2020-10-09 (×2): 237 mL via ORAL

## 2020-10-08 NOTE — Plan of Care (Signed)
  Problem: Education: Goal: Individualized Educational Video(s) Outcome: Progressing   Problem: Coping: Goal: Will verbalize positive feelings about self Outcome: Progressing Goal: Will identify appropriate support needs Outcome: Progressing   Problem: Health Behavior/Discharge Planning: Goal: Ability to manage health-related needs will improve Outcome: Progressing   Problem: Self-Care: Goal: Ability to participate in self-care as condition permits will improve Outcome: Progressing Goal: Verbalization of feelings and concerns over difficulty with self-care will improve Outcome: Progressing Goal: Ability to communicate needs accurately will improve Outcome: Progressing   Problem: Nutrition: Goal: Risk of aspiration will decrease Outcome: Progressing   Problem: Ischemic Stroke/TIA Tissue Perfusion: Goal: Complications of ischemic stroke/TIA will be minimized Outcome: Progressing   Problem: Clinical Measurements: Goal: Ability to maintain clinical measurements within normal limits will improve Outcome: Progressing Goal: Will remain free from infection Outcome: Progressing Goal: Diagnostic test results will improve Outcome: Progressing Goal: Respiratory complications will improve Outcome: Progressing Goal: Cardiovascular complication will be avoided Outcome: Progressing   Problem: Activity: Goal: Risk for activity intolerance will decrease Outcome: Progressing   Problem: Coping: Goal: Level of anxiety will decrease Outcome: Progressing   Problem: Elimination: Goal: Will not experience complications related to bowel motility Outcome: Progressing Goal: Will not experience complications related to urinary retention Outcome: Progressing   Problem: Pain Managment: Goal: General experience of comfort will improve Outcome: Progressing   Problem: Safety: Goal: Ability to remain free from injury will improve Outcome: Progressing   Problem: Skin Integrity: Goal:  Risk for impaired skin integrity will decrease Outcome: Progressing

## 2020-10-08 NOTE — Progress Notes (Signed)
Physical Therapy Treatment Patient Details Name: Kathleen [name] MRN: [id number] DOB: [birth year] Today's Date: 10/08/2020    History of Present Illness Pt is a 70 y.o. F who presents with right hemiplegia, facial droop and left gaze. Found to have distal left M1 occlusion s/p thrombectomy. Pt with new onset paroxysmal afib and acute respiratory insufficiencies with concern for ability to protect airway. Significant PMH: CKD stage 4, DM, CHF, morbid obesity, OSA on CPAP.    PT Comments    Pt resting upon PT arrival to room, requires initial encouragement to participate but quickly agreeable. Pt tolerated repeated sit<>stands, short distance gait, and toileting tasks (PT assisting with all pericare) this session. Pt fatigues quickly with gait, tolerating x3 ft only. Pt progressing towards PT goals, resting in bed at PT exit. Will continue to follow acutely.     Follow Up Recommendations  SNF     Equipment Recommendations  3in1 (PT);Wheelchair (measurements PT);Wheelchair cushion (measurements PT);Rolling walker with 5" wheels    Recommendations for Other Services       Precautions / Restrictions Precautions Precautions: Fall Precaution Comments: R hemiparesis Restrictions Weight Bearing Restrictions: No    Mobility  Bed Mobility Overal bed mobility: Needs Assistance Bed Mobility: Supine to Sit;Sit to Supine     Supine to sit: Min assist;HOB elevated Sit to supine: HOB elevated;Mod assist   General bed mobility comments: min-mod assist for trunk and RLE management into/out of bed, scooting to/from EOB, and boost up in bed upon return to supine.    Transfers Overall transfer level: Needs assistance Equipment used: Rolling walker (2 wheeled) Transfers: Sit to/from [facility] Sit to Stand: Min assist Stand pivot transfers: Min assist       General transfer comment: min assist for power up, rise, and steady; pivot to [facility] towards L with pt moaning in fatigue  vs discomfort. STS x3, from EOB x2 and BSC x1.  Ambulation/Gait Ambulation/Gait assistance: Min assist Gait Distance (Feet): 3 Feet Assistive device: Rolling walker (2 wheeled) Gait Pattern/deviations: Step-through pattern;Decreased stride length;Shuffle;Trunk flexed Gait velocity: decr   General Gait Details: min assist to steady, guide pt/RW, and controlling hip positioning when planning to sit at destination.   Stairs             Wheelchair Mobility    Modified Rankin (Stroke Patients Only) Modified Rankin (Stroke Patients Only) Pre-Morbid Rankin Score: No symptoms Modified Rankin: Moderately severe disability     Balance Overall balance assessment: Needs assistance Sitting-balance support: No upper extremity supported;Feet unsupported Sitting balance-Leahy Scale: Fair Sitting balance - Comments: able to sit EOB and on BSC without PT support   Standing balance support: During functional activity;Bilateral upper extremity supported Standing balance-Leahy Scale: Poor Standing balance comment: reliant on external support                            Cognition Arousal/Alertness: Awake/alert Behavior During Therapy: Flat affect Overall Cognitive Status: Difficult to assess Area of Impairment: Following commands;Awareness;Problem solving;Attention                   Current Attention Level: Sustained   Following Commands: Follows one step commands with increased time   Awareness: Intellectual Problem Solving: Slow processing;Requires verbal cues;Requires tactile cues General Comments: pt consistently answers yes/no questions appropriately with head motions, laughs appropriately during session. Follows commands with increased time.      Exercises      General Comments General  comments (skin integrity, edema, etc.): on 2LO2 upon arrival to room, briefly removed for [facility] use but placed back on 2LO2 at end of session. Pt with no labored breathing  throughout session, HR in afib rhythm but rate controlled and <100 bpm      Pertinent Vitals/Pain Pain Assessment: Faces Faces Pain Scale: Hurts a little bit Pain Location: generalized during transfer Pain Descriptors / Indicators: Grimacing Pain Intervention(s): Monitored during session;Limited activity within patient's tolerance;Repositioned    Home Living                      Prior Function            PT Goals (current goals can now be found in the care plan section) Acute Rehab PT Goals Patient Stated Goal: unable to state PT Goal Formulation: Patient unable to participate in goal setting Time For Goal Achievement: 10/13/20 Potential to Achieve Goals: Fair Progress towards PT goals: Progressing toward goals    Frequency    Min 3X/week      PT Plan Current plan remains appropriate    Co-evaluation              AM-PAC PT "6 Clicks" Mobility   Outcome Measure  Help needed turning from your back to your side while in a flat bed without using bedrails?: A Lot Help needed moving from lying on your back to sitting on the side of a flat bed without using bedrails?: A Lot Help needed moving to and from a bed to a chair (including a wheelchair)?: A Lot Help needed standing up from a chair using your arms (e.g., wheelchair or bedside chair)?: A Little Help needed to walk in hospital room?: A Lot Help needed climbing 3-5 steps with a railing? : A Lot 6 Click Score: 13    End of Session Equipment Utilized During Treatment: Oxygen Activity Tolerance: Patient tolerated treatment well;Patient limited by fatigue Patient left: with call bell/phone within reach;in bed;with bed alarm set;with nursing/sitter in room Nurse Communication: Mobility status PT Visit Diagnosis: Other abnormalities of gait and mobility (R26.89);Hemiplegia and hemiparesis Hemiplegia - Right/Left: Right Hemiplegia - caused by: Cerebral infarction     Time: 6578-4696 PT Time Calculation  (min) (ACUTE ONLY): 27 min  Charges:  $Therapeutic Activity: 23-37 mins                    Kathleen [name], PT DPT Acute Rehabilitation Services Pager [phone]  Office [phone]    Kathleen [name] 10/08/2020, 4:25 PM

## 2020-10-08 NOTE — Progress Notes (Signed)
  Speech Language Pathology Treatment:   dysphagia; speech/language Patient Details Name: Kathleen [name] MRN: [id number] DOB: 03/22/1950 Today's Date: 10/08/2020 Time: 7371-0626 SLP Time Calculation (min) (ACUTE ONLY): 37 min  Assessment / Plan / Recommendation Clinical Impression  Kathleen [name] demonstrates an ongoing mild-moderate receptive aphasia and severe-profound expressive aphasia with co-occurring apraxia. She also has dysphagia, as diagnosed on MBSS. She has been tolerating honey thick liquids via cup very well and is self-feeding. She was left with straw cup at bedside and was not drinking much, but once straw was taken out, she drank 8+ oz; suspect reduced labial seal was not allowing her to utilize straw effectively. She had "ah" voicing after each sip, which is potentially an apraxic attempt at throat clear. She cued to throat clear after wet vocal quality was noted and was unable to in 3/4 trials (final trial yielded successful throat clear). She took single bite of greek yogurt and grimaced then handed it back to SLP. Kathleen [name] reported she has never liked yogurt.   Oral apraxia targeted with attempts at pt mirroring articulatory movements. She effectively produced /u/ and /a/ vowels, but was unable to produce any consonants (/b/ /m/ were attempted to form boo, moo, ba, ma). She benefits from visualizing SLP articulators. Automatic phrasing was ineffective (attempts for counting, days of week, singing familiar songs). Pt was making attempts, but none intelligible. Reading at word level briefly assessed, but motor apraxia is limiting her at this time (unable to point to words). She did accurately ID herself versus her Kathleen [name]'s name with intentional eye gaze and nod for questions 4/4. (Who is older, who is born in May, who lives in [place], etc.)   Pt appears motivated and stimulable for improved speech/language/dysphagia. At this time, continue recommendations for honey thick liquids and  pureed solids. Continue intensive rehab.     HPI HPI: Pt is a 70 y.o. F who presents with right hemiplegia, facial droop and left gaze. Found to have distal left M1 occlusion s/p thrombectomy. Pt with new onset paroxysmal afib and acute respiratory insufficiencies with concern for ability to protect airway. Significant PMH: CKD stage 4, DM, CHF, morbid obesity, OSA on CPAP.      SLP Plan  Continue with current plan of care       Recommendations  Diet recommendations: Dysphagia 1 (puree);Honey-thick liquid Liquids provided via: Cup;Teaspoon Medication Administration: Whole meds with puree Supervision: Staff to assist with self feeding Compensations: Slow rate;Small sips/bites Postural Changes and/or Swallow Maneuvers: Upright 30-60 min after meal;Seated upright 90 degrees                Oral Care Recommendations: Oral care BID Follow up Recommendations: Skilled Nursing facility SLP Visit Diagnosis: Dysphagia, oropharyngeal phase (R13.12) Plan: Continue with current plan of care                   Marzell Allemand P. Maximiano Lott, M.S., [place] Pathologist Acute Rehabilitation Services Pager: [place] 10/08/2020, 11:38 AM

## 2020-10-08 NOTE — Progress Notes (Signed)
PROGRESS NOTE    Kathleen [name]  WFU:[id number] DOB: [birth year] DOA: 09/27/2020 PCP: Kristen Loader, FNP   Chief Complain:Code stroke  Brief Narrative: Patient is a 70 year old femalewith past medical history of CKD stage IV, diabetes mellitus type 2, chronic diastolic heart failure, hypertension, hyperlipidemia, OSA on CPAP, morbid obesity presented to the hospital as a code stroke.  Patient presented with acute right hemiplegia with right facial droop and leftward gaze. She was admitted under stroke team status post thrombectomy of left M1 occlusion. Patient was extubated post procedure and able to follow commands but subsequently 729 afternoon she had decreased mentation and labored breathing and was unable to protect her airway.  Critical care was consulted and patient underwent intubation and mechanical ventilation in ICU admission.  Subsequently, patient was extubated and was considered stable for transfer out of the ICU. Palliative care was also following during this hospitalization.  PT/OT recommended skilled subsequent discharge.  Plan is to discharge her to skilled nursing facility with palliative care services.  Possible discharge plan tomorrow  Assessment & Plan:   Principal Problem:   Stroke (cerebrum) (HCC) Active Problems:   Essential hypertension   OSA (obstructive sleep apnea)   Obesity (BMI 30-39.9)   CRI (chronic renal insufficiency), stage 3 (moderate) (HCC)   Pressure injury of skin   Dysphagia   Palliative care by specialist   Goals of care, counseling/discussion   Inadequate oral intake   Acute left MCA stroke: Status post thrombectomy.  Thought to be cardioembolic in nature  due to new onset A. fib.  Neurology was following.  Initially on heparin drip now has been transitioned to Eliquis. Continue lipitor.  Speech therapy recommended dysphagia 1 diet.  PT/OT recommended skilled nursing facility. He had needed feeding tube for feeding purposes during this  hospitalization, now taken out.  Family did not wish to proceed with PEG placement.  Acute hypoxic respiratory  failure: Was intubated initially for airway protection, now on room air to 2 L.  On CPAP at night.  Paroxysmal A. fib: New diagnosis.  On rate control with Coreg.  On anticoagulation with Eliquis.  Chronic diastolic congestive heart failure: Currently compensated.  Monitor volume status.  On spironolactone.  Echo as per  4/2o22 showed EF of 55 to 20%, grade 2 diastolic dysfunction.  Insulin-dependent diabetes type 2: Hemoglobin A1c of 7.8.  Blood sugars fluctuating.  Decrease insulin dosing.  History of hyperthyroidism: On methimazole  Hypertension: On amlodipine, Coreg  Leukocytosis: Unclear etiology.  Check CBC in a week  UTI: Urine culture showed Enterococcus.  Completed antibiotics course  CKD stage IV: Currently kidney function near baseline.  Baseline creatinine around 2.6.  Nephrology was consulted and have signed off.  Avoid nephrotoxins.  Morbid obesity: BMI 36.8.  Goals of care: Palliative care was consulted on this admission.  Plan for skilled facility with [facility] care follow-up.  Nutrition Problem: Inadequate oral intake Etiology: inability to eat      DVT prophylaxis:Eliquis Code Status: DNR Family Communication: Sister at bedside Status is: Inpatient  Remains inpatient appropriate because:Unsafe d/c plan  Dispo: The patient is from: Home              Anticipated d/c is to: SNF              Patient currently is medically stable to d/c.   Difficult to place patient No     Consultants: Neurology,palliative care  Procedures:None  Antimicrobials:  Anti-infectives (From admission, onward)  Start     Dose/Rate Route Frequency Ordered Stop   10/06/20 1430  amoxicillin (AMOXIL) capsule 250 mg        250 mg Oral Every 12 hours 10/06/20 1336 10/09/20 0959   10/03/20 1600  cefTRIAXone (ROCEPHIN) 1 g in sodium chloride 0.9 % 100 mL IVPB  Status:   Discontinued        1 g 200 mL/hr over 30 Minutes Intravenous Every 24 hours 10/03/20 1452 10/06/20 1336   10/03/20 1545  cefTRIAXone (ROCEPHIN) 1 g in sodium chloride 0.9 % 100 mL IVPB  Status:  Discontinued        1 g 200 mL/hr over 30 Minutes Intravenous Every 24 hours 10/03/20 1451 10/03/20 1452       Subjective: Patient seen and examined at the bedside this morning.  Hemodynamically stable.  Overall comfortable.  She was working with speech therapy.  Sister at bedside.  She was alert, awake and obeys commands but could not bring up words.  Objective: Vitals:   10/07/20 2326 10/08/20 0347 10/08/20 0500 10/08/20 0806  BP: 116/66 138/70  (!) 155/78  Pulse: 73 67  90  Resp: 18 18  19   Temp: (!) 97.5 F (36.4 C) 98 F (36.7 C)  97.6 F (36.4 C)  TempSrc: Oral Oral  Oral  SpO2: 99% 99%  98%  Weight:   95.1 kg   Height:        Intake/Output Summary (Last 24 hours) at 10/08/2020 1027 Last data filed at 10/08/2020 0347 Gross per 24 hour  Intake 760 ml  Output --  Net 760 ml   Filed Weights   10/04/20 0419 10/06/20 0500 10/08/20 0500  Weight: 84.1 kg 94.4 kg 95.1 kg    Examination:  General exam: Overall comfortable, not in distress,obese,aphagic HEENT: PERRL,right facial droop Respiratory system:  no wheezes or crackles  Cardiovascular system: S1 & S2 heard, RRR.  Gastrointestinal system: Abdomen is nondistended, soft and nontender. Central nervous system: Alert and awake,aphagic, right sided weakness Extremities: No edema, no clubbing ,no cyanosis Skin: No rashes, no ulcers,no icterus       Data Reviewed: I have personally reviewed following labs and imaging studies  CBC: Recent Labs  Lab 10/04/20 0020 10/05/20 0358 10/06/20 0318 10/07/20 0404 10/08/20 0701  WBC 7.5 9.2 13.0* 14.9* 14.0*  HGB 10.8* 10.9* 10.6* 11.4* 10.7*  HCT 34.4* 34.3* 34.7* 36.5 34.5*  MCV 91.2 92.2 93.8 92.2 92.5  PLT 326 333 346 340 174   Basic Metabolic Panel: Recent Labs  Lab  10/02/20 0244 10/03/20 0210 10/04/20 0020 10/05/20 0358 10/06/20 0318  NA 142 142 142 146* 146*  K 4.3 4.1 4.5 4.3 4.3  CL 106 105 105 107 107  CO2 25 26 25 31 28   GLUCOSE 132* 285* 283* 196* 62*  BUN 89* 88* 91* 93* 87*  CREATININE 4.13* 4.02* 3.86* 3.39* 3.14*  CALCIUM 8.1* 8.2* 8.3* 8.7* 9.2  MG  --  2.2 2.1 2.4 2.4  PHOS  --   --  3.9  --   --    GFR: Estimated Creatinine Clearance: 18.3 mL/min (A) (by C-G formula based on SCr of 3.14 mg/dL (H)). Liver Function Tests: Recent Labs  Lab 10/04/20 0020  AST 18  ALT 9  ALKPHOS 85  BILITOT 0.6  PROT 5.1*  ALBUMIN 2.1*   No results for input(s): LIPASE, AMYLASE in the last 168 hours. No results for input(s): AMMONIA in the last 168 hours. Coagulation Profile: No results for  input(s): INR, PROTIME in the last 168 hours. Cardiac Enzymes: No results for input(s): CKTOTAL, CKMB, CKMBINDEX, TROPONINI in the last 168 hours. BNP (last 3 results) No results for input(s): PROBNP in the last 8760 hours. HbA1C: No results for input(s): HGBA1C in the last 72 hours. CBG: Recent Labs  Lab 10/07/20 1626 10/07/20 2010 10/07/20 2328 10/08/20 0345 10/08/20 0938  GLUCAP 196* 172* 149* 173* 226*   Lipid Profile: No results for input(s): CHOL, HDL, LDLCALC, TRIG, CHOLHDL, LDLDIRECT in the last 72 hours. Thyroid Function Tests: No results for input(s): TSH, T4TOTAL, FREET4, T3FREE, THYROIDAB in the last 72 hours. Anemia Panel: No results for input(s): VITAMINB12, FOLATE, FERRITIN, TIBC, IRON, RETICCTPCT in the last 72 hours. Sepsis Labs: No results for input(s): PROCALCITON, LATICACIDVEN in the last 168 hours.  Recent Results (from the past 240 hour(s))  Urine Culture     Status: Abnormal   Collection Time: 10/03/20  2:52 PM   Specimen: Urine, Clean Catch  Result Value Ref Range Status   Specimen Description URINE, CLEAN CATCH  Final   Special Requests   Final    NONE Performed at [facility], [place] [address],  [place], [place] [address]    Culture (A)  Final    20,000 COLONIES/mL ESCHERICHIA COLI >=100,000 COLONIES/mL ENTEROCOCCUS FAECALIS    Report Status 10/06/2020 FINAL  Final   Organism ID, Bacteria ESCHERICHIA COLI (A)  Final   Organism ID, Bacteria ENTEROCOCCUS FAECALIS (A)  Final      Susceptibility   Escherichia coli - MIC*    AMPICILLIN 8 SENSITIVE Sensitive     CEFAZOLIN <=4 SENSITIVE Sensitive     CEFEPIME <=0.12 SENSITIVE Sensitive     CEFTRIAXONE <=0.25 SENSITIVE Sensitive     CIPROFLOXACIN <=0.25 SENSITIVE Sensitive     GENTAMICIN <=1 SENSITIVE Sensitive     IMIPENEM <=0.25 SENSITIVE Sensitive     NITROFURANTOIN <=16 SENSITIVE Sensitive     TRIMETH/SULFA <=20 SENSITIVE Sensitive     AMPICILLIN/SULBACTAM 4 SENSITIVE Sensitive     PIP/TAZO <=4 SENSITIVE Sensitive     * 20,000 COLONIES/mL ESCHERICHIA COLI   Enterococcus faecalis - MIC*    AMPICILLIN <=2 SENSITIVE Sensitive     NITROFURANTOIN <=16 SENSITIVE Sensitive     VANCOMYCIN 2 SENSITIVE Sensitive     * >=100,000 COLONIES/mL ENTEROCOCCUS FAECALIS         Radiology Studies: No results found.      Scheduled Meds:  amLODipine  10 mg Per Tube Daily   amoxicillin  250 mg Oral Q12H   apixaban  5 mg Oral BID   atorvastatin  80 mg Per Tube Daily   brimonidine  1 drop Both Eyes BID   carvedilol  6.25 mg Per Tube BID WC   chlorhexidine  15 mL Mouth Rinse BID   Chlorhexidine Gluconate Cloth  6 each Topical Daily   insulin aspart  0-9 Units Subcutaneous TID WC   insulin detemir  5 Units Subcutaneous BID   latanoprost  1 drop Both Eyes QHS   mouth rinse  15 mL Mouth Rinse q12n4p   methimazole  5 mg Per Tube Daily   pantoprazole sodium  40 mg Per Tube QHS   polyethylene glycol  17 g Per Tube Daily   spironolactone  12.5 mg Oral Daily   timolol  1 drop Both Eyes Daily   Continuous Infusions:   LOS: 11 days    Time spent:35 mins. More than 50% of that time was spent in counseling  and/or coordination of  care.      Shelly Coss, MD [facility] P8/10/2020, 10:27 AM

## 2020-10-09 ENCOUNTER — Other Ambulatory Visit (HOSPITAL_COMMUNITY): Payer: Self-pay

## 2020-10-09 LAB — GLUCOSE, CAPILLARY: Glucose-Capillary: 343 mg/dL — ABNORMAL HIGH (ref 70–99)

## 2020-10-09 MED ORDER — CARVEDILOL 6.25 MG PO TABS: 6.2500 mg | ORAL_TABLET | Freq: Two times a day (BID) | ORAL | Status: AC

## 2020-10-09 MED ORDER — APIXABAN 5 MG PO TABS: 5.0000 mg | ORAL_TABLET | Freq: Two times a day (BID) | ORAL | Status: AC

## 2020-10-09 NOTE — TOC Progression Note (Signed)
Transition of Care [facility]) - Progression Note    Patient Details  Name: Kathleen [name] MRN: [id number] Date of Birth: [birth year]  Transition of Care [facility]) CM/SW [place], [place] Phone Number: 10/09/2020, 10:00 AM  Clinical Narrative:   CSW received update from sister Kathleen [name] that the family was going out to tour the SNF today, and would update CSW afterwards. Kathleen [name] indicated that the family was agreeable with the patient transferring to [facility]. CSW provided [place] with information on paying for transportation, and transport was paid. Transportation is scheduled for 10 am tomorrow. CSW updated RN to pass along to be prepared for early morning discharge. CSW to follow.    Expected Discharge Plan: [place] Barriers to Discharge: Barriers Resolved  Expected Discharge Plan and Services Expected Discharge Plan: [place] In-house Referral: Clinical Social Work   Post Acute Care Choice: [place] Living arrangements for the past 2 months: Single Family Home Expected Discharge Date: 10/09/20                                     Social Determinants of Health (SDOH) Interventions    Readmission Risk Interventions No flowsheet data found.

## 2020-10-09 NOTE — TOC Benefit Eligibility Note (Signed)
Patient Teacher, English as a foreign language completed.    The patient is currently admitted and upon discharge could be taking Eliquis 5 mg.  The current 30 day co-pay is, $144.30 due to being in Coverage Gap (donut hole).   The patient is insured through [place], [place] Patient Advocate Specialist [place] Team Direct Number: [phone]  Fax: [phone]

## 2020-10-09 NOTE — TOC Transition Note (Signed)
Transition of Care [facility]) - CM/SW Discharge Note   Patient Details  Name: Kathleen [name] MRN: [id number] Date of Birth: 1950/08/08  Transition of Care [facility]) CM/SW Contact:  Geralynn Ochs, LCSW Phone Number: 10/09/2020, 10:00 AM   Clinical Narrative:   Nurse to call report to [phone].    Final next level of care: Skilled Nursing Facility Barriers to Discharge: Barriers Resolved   Patient Goals and CMS Choice Patient states their goals for this hospitalization and ongoing recovery are:: Rehab CMS Medicare.gov Compare Post Acute Care list provided to:: Patient Represenative (must comment) (Sister) Choice offered to / list presented to : Sibling  Discharge Placement              Patient chooses bed at:  [facility]) Patient to be transferred to facility by: [facility] Name of family member notified: Horris Latino Patient and family notified of of transfer: 10/09/20  Discharge Plan and Services In-house Referral: Clinical Social Work   Post Acute Care Choice: [place]                               Social Determinants of Health (SDOH) Interventions     Readmission Risk Interventions No flowsheet data found.

## 2020-10-09 NOTE — Progress Notes (Signed)
Called to give report to [facility], placed on hold for RN, no RN available sent to voicemail, will try again later

## 2020-10-09 NOTE — Progress Notes (Signed)
1900 facility called to get report, left number [phone], called at [name] to give report again, no answer, called again and spoke to [facility], report given

## 2020-10-09 NOTE — Progress Notes (Signed)
Occupational Therapy Treatment Patient Details Name: Kathleen [name] MRN: [id number] DOB: 1950-07-14 Today's Date: 10/09/2020    History of present illness 70 y.o. F who presents with right hemiplegia, facial droop and left gaze. Found to have distal left M1 occlusion s/p thrombectomy. Pt with new onset paroxysmal afib and acute respiratory insufficiencies with concern for ability to protect airway. Significant PMH: CKD stage 4, DM, CHF, morbid obesity, OSA on CPAP.   OT comments  Pt progressing towards established OT goals. Continues to present with decreased attention to R side, cognition, balance, strength, and activity tolerance. Pt performing functional mobility to sink with Min A +2 and RW. Pt completing grooming tasks at sink both in sitting and standing; poor attention and activity tolerance impacting functional performance. Continue to recommend dc to SNF and will continue to follow acutely as admitted.  SpO2 90% on EOB on RA. 96% on RA during grooming. placed back on 2L at end of session   Follow Up Recommendations  SNF    Equipment Recommendations  3 in 1 bedside commode;Wheelchair (measurements OT);Wheelchair cushion (measurements OT);Hospital bed    Recommendations for Other Services Rehab consult    Precautions / Restrictions Precautions Precautions: Fall Precaution Comments: R hemiparesis       Mobility Bed Mobility Overal bed mobility: Needs Assistance Bed Mobility: Supine to Sit     Supine to sit: Min assist;+2 for physical assistance     General bed mobility comments: Min A for holding therpaists hand and pulling into upright posture. Min A to bring hips towards EOB    Transfers Overall transfer level: Needs assistance Equipment used: Rolling walker (2 wheeled) Transfers: Sit to/from Stand Sit to Stand: Min assist;Mod assist;+2 physical assistance;+2 safety/equipment         General transfer comment: MIn A for sit<>Stand from EOB; Mod A for sit<>stand  with fatigue from straight back chair    Balance Overall balance assessment: Needs assistance Sitting-balance support: No upper extremity supported;Feet supported Sitting balance-Leahy Scale: Fair     Standing balance support: Bilateral upper extremity supported;During functional activity Standing balance-Leahy Scale: Poor                             ADL either performed or assessed with clinical judgement   ADL Overall ADL's : Needs assistance/impaired     Grooming: Oral care;Wash/dry hands;Wash/dry face;Minimal assistance;Maximal assistance;Sitting;Standing Grooming Details (indicate cue type and reason): Pt washing her face with her LUE while seated. Requiring increased time and fatigues quickly. Pt performing oral care while standing with Max A to incorporate RUE and sequence; poor attention and activity tolerance. Difficulty maintaining attention in standing. Able to perform hand hygiene with BUEs with Min Guard A while seated in quiet environment                 Toilet Transfer: Minimal assistance;Moderate assistance;+2 for safety/equipment;+2 for physical assistance;Ambulation;RW (simulated to chair) Toilet Transfer Details (indicate cue type and reason): MIn A for sit<>Stand from EOB; Mod A for sit<>stand with fatigue from straight back chair         Functional mobility during ADLs: Minimal assistance;+2 for physical assistance;+2 for safety/equipment;Rolling walker General ADL Comments: Pt performing grooming at sink. poor attention and acitivty tolerance impacting fucntional performance     Vision   Vision Assessment?: Vision impaired- to be further tested in functional context Alignment/Gaze Preference: Gaze left   Perception     Praxis  Cognition Arousal/Alertness: Awake/alert Behavior During Therapy: Flat affect Overall Cognitive Status: Impaired/Different from baseline Area of Impairment: Attention;Following commands;Awareness;Problem  solving                   Current Attention Level: Sustained   Following Commands: Follows one step commands inconsistently;Follows one step commands with increased time   Awareness: Intellectual Problem Solving: Slow processing;Difficulty sequencing;Requires verbal cues          Exercises Exercises: General Upper Extremity General Exercises - Upper Extremity Digit Composite Flexion: Right;AROM;5 reps;Seated Composite Extension: AROM;Right;5 reps;Seated   Shoulder Instructions       General Comments SpO2 90% on EOB on RA. 96% on RA during grooming. placed back on 2L at end of session    Pertinent Vitals/ Pain       Pain Assessment: Faces Faces Pain Scale: Hurts a little bit Pain Location: generalized during transfer Pain Descriptors / Indicators: Grimacing Pain Intervention(s): Monitored during session  Home Living                                          Prior Functioning/Environment              Frequency  Min 2X/week        Progress Toward Goals  OT Goals(current goals can now be found in the care plan section)  Progress towards OT goals: Progressing toward goals  Acute Rehab OT Goals Patient Stated Goal: unable to state OT Goal Formulation: With family Time For Goal Achievement: 10/13/20 Potential to Achieve Goals: Fair ADL Goals Pt Will Perform Eating: with min assist;with adaptive utensils;sitting Pt Will Perform Grooming: with min assist;sitting Pt/caregiver will Perform Home Exercise Program: Both right and left upper extremity;With minimal assist;With written HEP provided Additional ADL Goal #1: Pt will increase to x5 mins of functional tasks with minimal cues to attend to task. Additional ADL Goal #2: Pt will increase to modA for bed mobility as precursor for ADL.  Plan Discharge plan needs to be updated    Co-evaluation    PT/OT/SLP Co-Evaluation/Treatment: Yes Reason for Co-Treatment: For patient/therapist  safety;To address functional/ADL transfers   OT goals addressed during session: ADL's and self-care      AM-PAC OT "6 Clicks" Daily Activity     Outcome Measure   Help from another person eating meals?: A Lot Help from another person taking care of personal grooming?: A Little Help from another person toileting, which includes using toliet, bedpan, or urinal?: Total Help from another person bathing (including washing, rinsing, drying)?: A Lot Help from another person to put on and taking off regular upper body clothing?: A Lot Help from another person to put on and taking off regular lower body clothing?: Total 6 Click Score: 11    End of Session Equipment Utilized During Treatment: Gait belt;Rolling walker  OT Visit Diagnosis: Unsteadiness on feet (R26.81);Muscle weakness (generalized) (M62.81);Cognitive communication deficit (R41.841);Hemiplegia and hemiparesis Symptoms and signs involving cognitive functions: Cerebral infarction Hemiplegia - Right/Left: Right Hemiplegia - dominant/non-dominant: Dominant Hemiplegia - caused by: Cerebral infarction   Activity Tolerance Patient tolerated treatment well   Patient Left in chair;with call bell/phone within reach;with chair alarm set   Nurse Communication Mobility status        Time: 1660-6301 OT Time Calculation (min): 25 min  Charges: OT General Charges $OT Visit: 1 Visit OT Treatments $Self Care/Home Management : 8-22  mins  Jonia Oakey MSOT, OTR/L Acute Rehab Pager: [phone] Office: [place] 10/09/2020, 10:07 AM

## 2020-10-09 NOTE — Progress Notes (Signed)
Physical Therapy Treatment Patient Details Name: Kathleen [name] MRN: [id number] DOB: Apr 25, 1950 Today's Date: 10/09/2020    History of Present Illness 70 y.o. F who presents with right hemiplegia, facial droop and left gaze. Found to have distal left M1 occlusion s/p thrombectomy. Pt with new onset paroxysmal afib and acute respiratory insufficiencies with concern for ability to protect airway. Significant PMH: CKD stage 4, DM, CHF, morbid obesity, OSA on CPAP.    PT Comments    Pt required +2 min assist bed mobility, min to mod assist transfers, and min assist ambulation 10' x 2 with RW. Pt assisted to sink to participate in ADLs with OT. Pt in recliner with feet elevated at end of session.    Follow Up Recommendations  SNF     Equipment Recommendations  3in1 (PT);Wheelchair (measurements PT);Wheelchair cushion (measurements PT);Rolling walker with 5" wheels    Recommendations for Other Services       Precautions / Restrictions Precautions Precautions: Fall;Other (comment) Precaution Comments: R hemiparesis Restrictions Weight Bearing Restrictions: No    Mobility  Bed Mobility Overal bed mobility: Needs Assistance Bed Mobility: Supine to Sit     Supine to sit: Min assist;+2 for physical assistance     General bed mobility comments: Min A for holding therpaists hand and pulling into upright posture. Min A to bring hips towards EOB    Transfers Overall transfer level: Needs assistance Equipment used: Rolling walker (2 wheeled) Transfers: Sit to/from Stand Sit to Stand: Min assist;Mod assist;+2 physical assistance;+2 safety/equipment         General transfer comment: MIn A for sit<>Stand from EOB; Mod A for sit<>stand with fatigue from straight back chair  Ambulation/Gait Ambulation/Gait assistance: Min assist Gait Distance (Feet): 10 Feet (x 2) Assistive device: Rolling walker (2 wheeled) Gait Pattern/deviations: Step-through pattern;Decreased stride  length;Trunk flexed   Gait velocity interpretation: <1.8 ft/sec, indicate of risk for recurrent falls General Gait Details: assist to maintain balance and manage RW. Fatigues quickly.   Stairs             Wheelchair Mobility    Modified Rankin (Stroke Patients Only) Modified Rankin (Stroke Patients Only) Pre-Morbid Rankin Score: No symptoms Modified Rankin: Moderately severe disability     Balance Overall balance assessment: Needs assistance Sitting-balance support: No upper extremity supported;Feet supported Sitting balance-Leahy Scale: Fair     Standing balance support: Bilateral upper extremity supported;During functional activity Standing balance-Leahy Scale: Poor Standing balance comment: reliant on external support                            Cognition Arousal/Alertness: Awake/alert Behavior During Therapy: Flat affect Overall Cognitive Status: Impaired/Different from baseline Area of Impairment: Attention;Following commands;Awareness;Problem solving                   Current Attention Level: Sustained   Following Commands: Follows one step commands inconsistently;Follows one step commands with increased time   Awareness: Intellectual Problem Solving: Slow processing;Difficulty sequencing;Requires verbal cues        Exercises General Exercises - Upper Extremity Digit Composite Flexion: Right;AROM;5 reps;Seated Composite Extension: AROM;Right;5 reps;Seated    General Comments General comments (skin integrity, edema, etc.): VSS on RA      Pertinent Vitals/Pain Pain Assessment: Faces Pain Score: 0-No pain Faces Pain Scale: Hurts a little bit Pain Location: generalized during transfer Pain Descriptors / Indicators: Grimacing Pain Intervention(s): Monitored during session    Home Living  Prior Function            PT Goals (current goals can now be found in the care plan section) Acute Rehab PT  Goals Patient Stated Goal: unable to state Progress towards PT goals: Progressing toward goals    Frequency    Min 3X/week      PT Plan Current plan remains appropriate    Co-evaluation PT/OT/SLP Co-Evaluation/Treatment: Yes Reason for Co-Treatment: To address functional/ADL transfers PT goals addressed during session: Mobility/safety with mobility;Balance;Proper use of DME OT goals addressed during session: ADL's and self-care      AM-PAC PT "6 Clicks" Mobility   Outcome Measure  Help needed turning from your back to your side while in a flat bed without using bedrails?: A Lot Help needed moving from lying on your back to sitting on the side of a flat bed without using bedrails?: A Lot Help needed moving to and from a bed to a chair (including a wheelchair)?: A Lot Help needed standing up from a chair using your arms (e.g., wheelchair or bedside chair)?: A Lot Help needed to walk in hospital room?: A Little Help needed climbing 3-5 steps with a railing? : A Lot 6 Click Score: 13    End of Session Equipment Utilized During Treatment: Gait belt;Oxygen Activity Tolerance: Patient tolerated treatment well Patient left: in chair;with call bell/phone within reach;with chair alarm set Nurse Communication: Mobility status PT Visit Diagnosis: Other abnormalities of gait and mobility (R26.89);Hemiplegia and hemiparesis Hemiplegia - Right/Left: Right Hemiplegia - caused by: Cerebral infarction     Time: 2336-1224 PT Time Calculation (min) (ACUTE ONLY): 28 min  Charges:  $Gait Training: 8-22 mins                     Lorrin Goodell, PT  Office # [phone] Pager [phone]    [name] 10/09/2020, 11:14 AM

## 2020-10-09 NOTE — Discharge Summary (Addendum)
Physician Discharge Summary  Kathleen [name]:[id number] DOB: 26-May-1950 DOA: 09/27/2020  PCP: Kristen Loader, FNP  Admit date: 09/27/2020 Discharge date: 10/09/2020  Admitted From: Home Disposition: SNF  Discharge Condition:Stable CODE STATUS:DNR Diet recommendation: Dysphagia 1 diet  Brief/Interim Summary:  Patient is a 70 year old female with past medical history of CKD stage IV, diabetes mellitus type 2, chronic diastolic heart failure, hypertension, hyperlipidemia, OSA on CPAP, morbid obesity presented to the hospital as a code stroke.  Patient presented with acute right hemiplegia with right facial droop and leftward gaze. She was admitted under stroke team and is status post thrombectomy of left M1 occlusion. Patient was extubated post procedure and able to follow commands but subsequently on 7/29 afternoon ,she had decreased mentation and labored breathing and was unable to protect her airway.  Critical care was consulted and patient underwent intubation and mechanical ventilation in ICU admission.  Subsequently, patient was extubated and was considered stable for transfer out of the ICU.  Since then she has been hemodynamically stable. Palliative care was also following during this hospitalization.  PT/OT recommended SNF discharge. She is medically stable for  discharge to skilled nursing facility with palliative care services.  Following problems were addressed during her hospitalization:  Acute left MCA stroke: Status post thrombectomy.  Thought to be cardioembolic in nature  due to new onset A. fib.  Neurology was following.  Initially on heparin drip now has been transitioned to Eliquis. Continue lipitor.  Speech therapy recommended dysphagia 1 diet.  PT/OT recommended skilled nursing facility. He had needed feeding tube for feeding purposes during this hospitalization, now taken out.  Family did not wish to proceed with PEG placement. Follow up with neurology as an outpatient,  follow-up with the speech. She has aphagia and right sided weakness,right facial droop   Acute hypoxic respiratory  failure: Was intubated initially for airway protection, now on room air to 2 L.  On CPAP at night.   Paroxysmal A. fib: New diagnosis.  On rate control with Coreg.  On anticoagulation with Eliquis.   Chronic diastolic congestive heart failure: Currently compensated.  Monitor volume status.  On spironolactone.  Echo as per  06/2020 showed EF of 55 to 53%, grade 2 diastolic dysfunction.   Insulin-dependent diabetes type 2: Hemoglobin A1c of 7.8. Continue home regimen   History of hyperthyroidism: On methimazole   Hypertension: On amlodipine, Coreg   Leukocytosis:No signs of infection.  Check CBC in a week   UTI: Urine culture showed Enterococcus.  Completed antibiotics course   CKD stage IV: Currently kidney function near baseline.  Baseline creatinine around 2.6.  Nephrology was consulted and have signed off.  Avoid nephrotoxins.Follow up with nephrology as an outpatient   Morbid obesity: BMI 36.8.   Goals of care: Palliative care was consulted on this admission.  Plan for skilled facility with palliative care follow-up.DNR    Discharge Diagnoses:  Principal Problem:   Stroke (cerebrum) ([place]) Active Problems:   Essential hypertension   OSA (obstructive sleep apnea)   Obesity (BMI 30-39.9)   CRI (chronic renal insufficiency), stage 3 (moderate) ([facility])   Pressure injury of skin   Dysphagia   Palliative care by specialist   Goals of care, counseling/discussion   Inadequate oral intake    Discharge Instructions  Discharge Instructions     Ambulatory referral to Neurology   Complete by: As directed    An appointment is requested in approximately: 2 weeks   Diet general   Complete by:  As directed    Dysphagia 1   Discharge instructions   Complete by: As directed    1)Take your medications as instructed 2)Follow up with neurology as an outpatient in 4  weeks.  Name and number of the provider group has been attached 3)Follow up with palliative care as an outpatient 4)Follow up with speech therapy as an outpatient   Increase activity slowly   Complete by: As directed    No wound care   Complete by: As directed       Allergies as of 10/09/2020       Reactions   Ace Inhibitors Swelling   Mouth area swelling   Atorvastatin Diarrhea, Other (See Comments)   Severe diarrhea   Benicar [olmesartan] Swelling, Other (See Comments)   Angioedema   Epinephrine Hcl Hives      Levaquin [levofloxacin In D5w] Swelling, Other (See Comments)   Angioedema   Losartan Swelling        Medication List     STOP taking these medications    amoxicillin 875 MG tablet Commonly known as: AMOXIL   chlorthalidone 25 MG tablet Commonly known as: HYGROTON   Farxiga 10 MG Tabs tablet Generic drug: dapagliflozin propanediol       TAKE these medications    amLODipine 10 MG tablet Commonly known as: NORVASC Take 10 mg by mouth daily.   apixaban 5 MG Tabs tablet Commonly known as: ELIQUIS Take 1 tablet (5 mg total) by mouth 2 (two) times daily.   atorvastatin 80 MG tablet Commonly known as: LIPITOR Take 1 tablet (80 mg total) by mouth daily.   brimonidine 0.2 % ophthalmic solution Commonly known as: ALPHAGAN Place 1 drop into both eyes 2 (two) times daily.   carboxymethylcellulose 0.5 % Soln Commonly known as: REFRESH PLUS Place 1 drop into both eyes 3 (three) times daily as needed (dry eyes).   carvedilol 6.25 MG tablet Commonly known as: COREG Place 1 tablet (6.25 mg total) into feeding tube 2 (two) times daily with a meal. What changed:  medication strength how much to take how to take this when to take this   COLACE PO Take by mouth.   latanoprost 0.005 % ophthalmic solution Commonly known as: XALATAN Place 1 drop into both eyes at bedtime.   Levemir FlexPen 100 UNIT/ML FlexPen Generic drug: insulin detemir Inject  10-20 Units into the skin daily at 10 pm. If blood sugar > 100 take 20 units, but takes less if lower than 100   loperamide 2 MG tablet Commonly known as: IMODIUM A-D Take 2 mg by mouth 4 (four) times daily as needed for diarrhea or loose stools.   methimazole 5 MG tablet Commonly known as: TAPAZOLE Take 5 mg by mouth daily.   multivitamin capsule Take 1 capsule by mouth daily.   NovoLOG FlexPen 100 UNIT/ML injection Generic drug: insulin aspart Inject 0-18 Units into the skin 3 (three) times daily with meals. Based on sliding scale   omeprazole 20 MG capsule Commonly known as: PRILOSEC Take 20 mg by mouth daily.   ONE TOUCH ULTRA TEST test strip Generic drug: glucose blood   spironolactone 25 MG tablet Commonly known as: ALDACTONE Take 12.5 mg by mouth daily.   timolol 0.5 % ophthalmic solution Commonly known as: BETIMOL Place 1 drop into both eyes in the morning.        Allergies  Allergen Reactions   Ace Inhibitors Swelling    Mouth area swelling   Atorvastatin Diarrhea and  Other (See Comments)    Severe diarrhea   Benicar [Olmesartan] Swelling and Other (See Comments)    Angioedema   Epinephrine Hcl Hives        Levaquin [Levofloxacin In D5w] Swelling and Other (See Comments)    Angioedema    Losartan Swelling    Consultations: Neurology,PCCM,palliative care   Procedures/Studies: DG Chest 2 View  Result Date: 09/28/2020 CLINICAL DATA:  Pneumonia. EXAM: CHEST - 2 VIEW COMPARISON:  Lung apices from neck CTA 09/27/2020, remote chest radiograph 05/27/2016 FINDINGS: The lateral view is limited as patient could not raise arms above head. Cardiomegaly. Aortic atherosclerosis. Patchy right suprahilar opacity which corresponds to abnormality on recent CT. Limited left lung base assessment due to overlapping structures. No pneumothorax. No evidence of pleural effusion. No pulmonary edema. Thoracic spondylosis. IMPRESSION: 1. Patchy right suprahilar opacity which  corresponds to abnormality on recent CT, suspicious for pneumonia. 2. Cardiomegaly.  Aortic Atherosclerosis (ICD10-I70.0). Electronically Signed   By: Keith Rake M.D.   On: 09/28/2020 02:08   DG Abd 1 View  Result Date: 09/28/2020 CLINICAL DATA:  Status post repositioning of feeding tube. EXAM: ABDOMEN - 1 VIEW COMPARISON:  Single-view of the abdomen 05/10/2015. FINDINGS: Two images are provided. On the first image, tip of the patient's feeding tube is in the fundus of the stomach. On the second image, tip of the tube is in the first portion of duodenum. IMPRESSION: Tip of the patient's feeding tube is in the first portion of the duodenum. Electronically Signed   By: Inge Rise M.D.   On: 09/28/2020 15:34   MR ANGIO HEAD WO CONTRAST  Result Date: 09/28/2020 CLINICAL DATA:  Follow-up examination for acute stroke. EXAM: MRI HEAD WITHOUT CONTRAST MRA HEAD WITHOUT CONTRAST TECHNIQUE: Multiplanar, multi-echo pulse sequences of the brain and surrounding structures were acquired without intravenous contrast. Angiographic images of the Circle of Willis were acquired using MRA technique without intravenous contrast. COMPARISON:  Comparison made with prior CTs from 09/27/2020. FINDINGS: MRI HEAD FINDINGS Brain: Cerebral volume within normal limits for age. Scattered patchy T2/FLAIR hyperintensity noted within the periventricular and deep white matter both cerebral hemispheres as well as the pons, most consistent with chronic small vessel ischemic disease, mild in nature. Small remote lacunar infarct noted at the right basal ganglia/corona radiata. Moderate-sized area of restricted diffusion seen involving the left insula and overlying left frontal operculum, consistent with acute left MCA distribution infarct. Minimal patchy involvement of the occipital lobe posteriorly. Evidence for associated petechial hemorrhage without frank intraparenchymal hematoma (series 14, image 33). Localized gyral swelling and  edema without significant regional mass effect. No other evidence for acute or subacute ischemia. Gray-white matter differentiation otherwise maintained. No encephalomalacia to suggest chronic cortical infarction elsewhere within the brain. Few scattered chronic micro hemorrhages noted involving the cerebellum and left occipital lobe, nonspecific, but suspected to be hypertensive in nature. No mass lesion or midline shift. No hydrocephalus or extra-axial fluid collection. Pituitary gland suprasellar region within normal limits. Midline structures intact. Vascular: Major intracranial vascular flow voids are maintained. Skull and upper cervical spine: Craniocervical junction within normal limits. Bone marrow signal intensity within normal limits. Mild multifocal swelling noted about the scalp. Sinuses/Orbits: Sequelae of prior bilateral ocular lens replacement. Globes and orbital soft tissues demonstrate no acute finding. Mild scattered mucosal thickening noted throughout the paranasal sinuses. Trace left mastoid effusion noted, of doubtful significance. Inner ear structures grossly normal. Other: None. MRA HEAD FINDINGS Anterior circulation: Examination mildly degraded by motion artifact. Visualized  distal cervical segments of the internal carotid arteries are patent with antegrade flow. Petrous segments patent bilaterally. Scattered atheromatous irregularity within the carotid siphons without hemodynamically significant stenosis. A1 segments patent bilaterally. Normal anterior communicating artery complex. Anterior cerebral arteries patent to their distal aspects without stenosis. No M1 stenosis or occlusion. No proximal MCA branch occlusion now seen. Distal MCA branches well perfused and symmetric, although demonstrate diffuse small vessel atheromatous irregularity. Posterior circulation: Both V4 segments patent to the vertebrobasilar junction without stenosis. Left vertebral artery slightly dominant. Both PICA  origins patent and normal. Basilar patent to its distal aspect without stenosis. Superior cerebellar arteries patent bilaterally. Both PCAs primarily supplied via the basilar and are well perfused to their distal aspects. No intracranial aneurysm. Anatomic variants: None significant. IMPRESSION: MRI HEAD IMPRESSION: 1. Moderate-sized acute left MCA distribution infarct involving the left insula and overlying left frontal operculum. Evidence for associated petechial hemorrhage without frank intraparenchymal hematoma or significant mass effect at this time. 2. Underlying mild chronic microvascular ischemic disease. Small remote right basal ganglia/corona radiata lacunar infarct. MRA HEAD IMPRESSION: 1. Interval revascularization of previously seen proximal left MCA occlusion. No large vessel occlusion now seen. 2. Intracranial atherosclerotic disease without hemodynamically significant or correctable stenosis. Electronically Signed   By: Jeannine Boga M.D.   On: 09/28/2020 02:26   MR BRAIN WO CONTRAST  Result Date: 09/28/2020 CLINICAL DATA:  Follow-up examination for acute stroke. EXAM: MRI HEAD WITHOUT CONTRAST MRA HEAD WITHOUT CONTRAST TECHNIQUE: Multiplanar, multi-echo pulse sequences of the brain and surrounding structures were acquired without intravenous contrast. Angiographic images of the Circle of Willis were acquired using MRA technique without intravenous contrast. COMPARISON:  Comparison made with prior CTs from 09/27/2020. FINDINGS: MRI HEAD FINDINGS Brain: Cerebral volume within normal limits for age. Scattered patchy T2/FLAIR hyperintensity noted within the periventricular and deep white matter both cerebral hemispheres as well as the pons, most consistent with chronic small vessel ischemic disease, mild in nature. Small remote lacunar infarct noted at the right basal ganglia/corona radiata. Moderate-sized area of restricted diffusion seen involving the left insula and overlying left frontal  operculum, consistent with acute left MCA distribution infarct. Minimal patchy involvement of the occipital lobe posteriorly. Evidence for associated petechial hemorrhage without frank intraparenchymal hematoma (series 14, image 33). Localized gyral swelling and edema without significant regional mass effect. No other evidence for acute or subacute ischemia. Gray-white matter differentiation otherwise maintained. No encephalomalacia to suggest chronic cortical infarction elsewhere within the brain. Few scattered chronic micro hemorrhages noted involving the cerebellum and left occipital lobe, nonspecific, but suspected to be hypertensive in nature. No mass lesion or midline shift. No hydrocephalus or extra-axial fluid collection. Pituitary gland suprasellar region within normal limits. Midline structures intact. Vascular: Major intracranial vascular flow voids are maintained. Skull and upper cervical spine: Craniocervical junction within normal limits. Bone marrow signal intensity within normal limits. Mild multifocal swelling noted about the scalp. Sinuses/Orbits: Sequelae of prior bilateral ocular lens replacement. Globes and orbital soft tissues demonstrate no acute finding. Mild scattered mucosal thickening noted throughout the paranasal sinuses. Trace left mastoid effusion noted, of doubtful significance. Inner ear structures grossly normal. Other: None. MRA HEAD FINDINGS Anterior circulation: Examination mildly degraded by motion artifact. Visualized distal cervical segments of the internal carotid arteries are patent with antegrade flow. Petrous segments patent bilaterally. Scattered atheromatous irregularity within the carotid siphons without hemodynamically significant stenosis. A1 segments patent bilaterally. Normal anterior communicating artery complex. Anterior cerebral arteries patent to their distal aspects without stenosis.  No M1 stenosis or occlusion. No proximal MCA branch occlusion now seen. Distal  MCA branches well perfused and symmetric, although demonstrate diffuse small vessel atheromatous irregularity. Posterior circulation: Both V4 segments patent to the vertebrobasilar junction without stenosis. Left vertebral artery slightly dominant. Both PICA origins patent and normal. Basilar patent to its distal aspect without stenosis. Superior cerebellar arteries patent bilaterally. Both PCAs primarily supplied via the basilar and are well perfused to their distal aspects. No intracranial aneurysm. Anatomic variants: None significant. IMPRESSION: MRI HEAD IMPRESSION: 1. Moderate-sized acute left MCA distribution infarct involving the left insula and overlying left frontal operculum. Evidence for associated petechial hemorrhage without frank intraparenchymal hematoma or significant mass effect at this time. 2. Underlying mild chronic microvascular ischemic disease. Small remote right basal ganglia/corona radiata lacunar infarct. MRA HEAD IMPRESSION: 1. Interval revascularization of previously seen proximal left MCA occlusion. No large vessel occlusion now seen. 2. Intracranial atherosclerotic disease without hemodynamically significant or correctable stenosis. Electronically Signed   By: Jeannine Boga M.D.   On: 09/28/2020 02:26   NM GASTRIC EMPTYING  Result Date: 09/22/2020 CLINICAL DATA:  Acid reflux and heartburn.  Lap band procedure. EXAM: NUCLEAR MEDICINE GASTRIC EMPTYING SCAN TECHNIQUE: After oral ingestion of radiolabeled meal, sequential abdominal images were obtained for 4 hours. Percentage of activity emptying the stomach was calculated at 1 hour, 2 hour, 3 hour, and 4 hours. RADIOPHARMACEUTICALS:  2.2 mCi Tc-19m sulfur colloid in standardized meal COMPARISON:  None. FINDINGS: Expected location of the stomach in the left upper quadrant. Ingested meal empties the stomach gradually over the course of the study. 1.58% emptied at 1 hr ( normal >= 10%) 2.89% emptied at 2 hr ( normal >= 40%) 7.42%  emptied at 3 hr ( normal >= 70%) 4.80% emptied at 4 hr ( normal >= 90%) IMPRESSION: Delayed gastric emptying study. Electronically Signed   By: Dorise Bullion III M.D   On: 09/22/2020 13:56   IR [place]  Result Date: 09/27/2020 INDICATION: 70 year old female presents with acute stroke, left MCA syndrome. She presents for cerebral angiogram and attempt for mechanical thrombectomy to treat emergent large vessel occlusion. EXAM: ULTRASOUND-GUIDED ACCESS RIGHT COMMON FEMORAL ARTERY LEFT-SIDED CERVICAL AND CEREBRAL ANGIOGRAM MECHANICAL THROMBECTOMY LEFT MCA DEPLOYMENT OF ANGIO-SEAL FOR HEMOSTASIS COMPARISON:  CT imaging same date MEDICATIONS: None ANESTHESIA/SEDATION: The anesthesia team was present to provide general endotracheal tube anesthesia and for patient monitoring during the procedure. Intubation was performed in biplane room 2. left radial arterial line was performed by the anesthesia team. Interventional neuro radiology nursing staff was also present. CONTRAST:  70 cc Omnipaque 300 FLUOROSCOPY TIME:  Fluoroscopy Time: 14 minutes 54 seconds (779.8 mGy). COMPLICATIONS: None TECHNIQUE: Informed written consent was obtained from the patient's family after a thorough discussion of the procedural risks, benefits and alternatives by our stroke neurology team. Specific risks discussed include: Bleeding, infection, contrast reaction, kidney injury/failure, need for further procedure/surgery, arterial injury or dissection, embolization to new territory, intracranial hemorrhage (10-15% risk), neurologic deterioration, cardiopulmonary collapse, death. All questions were addressed. Maximal Sterile Barrier Technique was utilized including during the procedure including caps, mask, sterile gowns, sterile gloves, sterile drape, hand hygiene and skin antiseptic. A timeout was performed prior to the initiation of the procedure. The anesthesia team was present to provide general endotracheal tube anesthesia and for  patient monitoring during the procedure. Interventional neuro radiology nursing staff was also present. FINDINGS: Initial Findings: Aorta: Atherosclerotic plaque of the aortic arch. The origin of the left common carotid artery  head acute angulation with atherosclerotic plaque at the origin. Left common carotid artery: Angulation at the CCA origin with atherosclerotic plaque. Left external carotid artery: Patent with antegrade flow. Left internal carotid artery: Normal course caliber and contour of the cervical portion. Vertical and petrous segment patent with normal course caliber contour. Cavernous segment patent. Clinoid segment patent. Antegrade flow of the ophthalmic artery. Ophthalmic segment patent. Terminus patent. Left MCA: Proximal MCA is patent. There is a proximal origin of the inferior division/temporal branch which remains patent and perfuses the majority of the left temporal lobe. There is also a proximal frontal branch, which remains patent just proximal to the occlusion site, perfusing the pre frontal region. Significant leptomeningeal collaterals on the initial injection, filling significant portion of the MCA territory of the left hemisphere. The occlusion is in the dominant superior division. Within the target artery: TICI 0: No perfusion or antegrade flow beyond site of occlusion. Left ACA: A 1 segment patent. A 2 segment perfuses the left territory. There is some cross flow of the anterior communicating artery, with transient opacification of the right ACA. Completion Findings: Left MCA: After single pass with direct aspiration strategy there is complete restoration of flow through the MCA. Completion: TICI 3: Complete perfusion of the territory affected by the occluded artery Flat panel CT performed demonstrating no evidence of hemorrhage. Mild contrast staining within the left paracentral lobule. PROCEDURE: The anesthesia team was present to provide general endotracheal tube anesthesia and for  patient monitoring during the procedure. Intubation was performed in negative pressure [place] in neuro IR holding. Interventional neuro radiology nursing staff was also present. Ultrasound survey of the right inguinal region was performed with images stored and sent to PACs. 11 blade scalpel was used to make a small incision. Blunt dissection was performed with US guidance. A micropuncture needle was used access the right common femoral artery under ultrasound. With excellent arterial blood flow returned, an .018 micro wire was passed through the needle, observed to enter the abdominal aorta under fluoroscopy. The needle was removed, and a micropuncture sheath was placed over the wire. The inner dilator and wire were removed, and an 035 wire was advanced under fluoroscopy into the abdominal aorta. The sheath was removed and a 25cm 84F straight vascular sheath was placed. The dilator was removed and the sheath was flushed. Sheath was attached to pressurized and heparinized saline bag for constant forward flow. A coaxial system was then advanced over the 035 wire. This included a 95cm 087 "Walrus" balloon guide with coaxial 125cm Berenstein diagnostic catheter. This was advanced to the proximal descending thoracic aorta. Wire was then removed. Double flush of the catheter was performed. Catheter was then used to select the left common carotid artery. With the coaxial configuration, glidewire was attempted to navigate into the left common carotid artery origin. We failed to access with this coaxial configuration, which was then removed on the 03/05 diagnostic wire. JB 1 catheter was then advanced on the diagnostic wire to the descending thoracic aorta. Wire was removed and the catheter was double flushed. Catheter was then used to engage the left common carotid artery origin. Standard Glidewire was advanced into the distal left common carotid artery and the JB 1 was advanced on the wire. Wire was removed with gentle  contrast injection confirming location within the external carotid artery. The catheter was withdrawn into the common carotid artery with the Glidewire used to select the left ICA. Once the JB 1 catheter was advanced on the  Glidewire into a distal cervical ICA position, the Glidewire was removed. An exchange length roadrunner wire was then placed. Catheter was removed. The coaxial parents teen catheter and wall wrist balloon guide were then advanced on the roadrunner wire to the distal ICA. With a distal position of the balloon guide achieved, the parents teen catheter and wire were removed with constant aspiration at the hub. Aspiration was performed at the hub of the catheter. Angiogram was performed. Road map function was used once the occluded vessel was identified. Copious back flush was performed and the balloon catheter was attached to heparinized and pressurized saline bag for forward flow. A second coaxial system was then advanced through the balloon catheter, which included the selected intermediate catheter, microcatheter, and microwire. In this scenario, the set up included a zoom 55 137 cm intermediate catheter, a Trevo Provue18 microcatheter, and 014 synchro soft wire. This system was advanced through the balloon guide catheter under the road-map function, with adequate back-flush at the rotating hemostatic valve at that back end of the balloon guide. Microcatheter and the intermediate catheter system were advanced through the terminal ICA and MCA to the level of the occlusion. Microwire and catheter did not cross the occluded segment. The aspiration catheter was then advanced into the proximal MCA. Microcatheter and wire were removed with constant drip at the hub. The proprietary engine was then attached to the hub of the zoom catheter. Flow was confirmed and then the catheter was advanced into the occluded segment, observing cessation of flow. Catheter was then removed from the balloon guide under  constant aspiration. Free aspiration was confirmed at the hub of the balloon guide catheter, with free blood return confirmed. Control angiogram was performed. Restoration of flow was confirmed. Angiogram of the cervical ICA was performed. Balloon guide was then removed. The skin at the puncture site was then cleaned with Chlorhexidine. The 8 French sheath was removed and a 7 [place] sheath was placed. 2 minutes of manual pressure were observed and then a 6 [place] Angio-Seal was deployed. This maneuver was performed, given that we have no stock available of 8 French Angio-Seal for hemostasis device. Flat panel CT was performed. Patient was extubated once the CT was reviewed and a negative COVID test was confirmed. Patient tolerated the procedure well and remained hemodynamically stable throughout. No complications were encountered and no significant blood loss encountered. IMPRESSION: Status post ultrasound guided access right common femoral artery for left-sided cervical/cerebral angiogram and mechanical thrombectomy of left MCA occlusion, achieving TICI 3 flow. Angio-Seal for hemostasis. Signed, Dulcy Fanny. Dellia Nims, RPVI [facility] Radiology PLAN: ICU status Target systolic blood pressure of 120-140 Right hip straight time 6 hours Frequent neurovascular checks Repeat neurologic imaging with CT and/MRI at the discretion of neurology team Electronically Signed   By: Corrie Mckusick D.O.   On: 09/27/2020 17:03   IR US Guide Vasc Access Right  Result Date: 09/27/2020 INDICATION: 70 year old female presents with acute stroke, left MCA syndrome. She presents for cerebral angiogram and attempt for mechanical thrombectomy to treat emergent large vessel occlusion. EXAM: ULTRASOUND-GUIDED ACCESS RIGHT COMMON FEMORAL ARTERY LEFT-SIDED CERVICAL AND CEREBRAL ANGIOGRAM MECHANICAL THROMBECTOMY LEFT MCA DEPLOYMENT OF ANGIO-SEAL FOR HEMOSTASIS COMPARISON:  CT imaging same date  MEDICATIONS: None ANESTHESIA/SEDATION: The anesthesia team was present to provide general endotracheal tube anesthesia and for patient monitoring during the procedure. Intubation was performed in biplane room 2. left radial arterial line was performed by the anesthesia team. Interventional neuro radiology  nursing staff was also present. CONTRAST:  70 cc Omnipaque 300 FLUOROSCOPY TIME:  Fluoroscopy Time: 14 minutes 54 seconds (779.8 mGy). COMPLICATIONS: None TECHNIQUE: Informed written consent was obtained from the patient's family after a thorough discussion of the procedural risks, benefits and alternatives by our stroke neurology team. Specific risks discussed include: Bleeding, infection, contrast reaction, kidney injury/failure, need for further procedure/surgery, arterial injury or dissection, embolization to new territory, intracranial hemorrhage (10-15% risk), neurologic deterioration, cardiopulmonary collapse, death. All questions were addressed. Maximal Sterile Barrier Technique was utilized including during the procedure including caps, mask, sterile gowns, sterile gloves, sterile drape, hand hygiene and skin antiseptic. A timeout was performed prior to the initiation of the procedure. The anesthesia team was present to provide general endotracheal tube anesthesia and for patient monitoring during the procedure. Interventional neuro radiology nursing staff was also present. FINDINGS: Initial Findings: Aorta: Atherosclerotic plaque of the aortic arch. The origin of the left common carotid artery head acute angulation with atherosclerotic plaque at the origin. Left common carotid artery: Angulation at the CCA origin with atherosclerotic plaque. Left external carotid artery: Patent with antegrade flow. Left internal carotid artery: Normal course caliber and contour of the cervical portion. Vertical and petrous segment patent with normal course caliber contour. Cavernous segment patent. Clinoid segment patent.  Antegrade flow of the ophthalmic artery. Ophthalmic segment patent. Terminus patent. Left MCA: Proximal MCA is patent. There is a proximal origin of the inferior division/temporal branch which remains patent and perfuses the majority of the left temporal lobe. There is also a proximal frontal branch, which remains patent just proximal to the occlusion site, perfusing the pre frontal region. Significant leptomeningeal collaterals on the initial injection, filling significant portion of the MCA territory of the left hemisphere. The occlusion is in the dominant superior division. Within the target artery: TICI 0: No perfusion or antegrade flow beyond site of occlusion. Left ACA: A 1 segment patent. A 2 segment perfuses the left territory. There is some cross flow of the anterior communicating artery, with transient opacification of the right ACA. Completion Findings: Left MCA: After single pass with direct aspiration strategy there is complete restoration of flow through the MCA. Completion: TICI 3: Complete perfusion of the territory affected by the occluded artery Flat panel CT performed demonstrating no evidence of hemorrhage. Mild contrast staining within the left paracentral lobule. PROCEDURE: The anesthesia team was present to provide general endotracheal tube anesthesia and for patient monitoring during the procedure. Intubation was performed in negative pressure [place] in neuro IR holding. Interventional neuro radiology nursing staff was also present. Ultrasound survey of the right inguinal region was performed with images stored and sent to PACs. 11 blade scalpel was used to make a small incision. Blunt dissection was performed with US guidance. A micropuncture needle was used access the right common femoral artery under ultrasound. With excellent arterial blood flow returned, an .018 micro wire was passed through the needle, observed to enter the abdominal aorta under fluoroscopy. The needle was removed, and a  micropuncture sheath was placed over the wire. The inner dilator and wire were removed, and an 035 wire was advanced under fluoroscopy into the abdominal aorta. The sheath was removed and a 25cm 29F straight vascular sheath was placed. The dilator was removed and the sheath was flushed. Sheath was attached to pressurized and heparinized saline bag for constant forward flow. A coaxial system was then advanced over the 035 wire. This included a 95cm 087 "[place]" balloon guide with coaxial 125cm  Berenstein diagnostic catheter. This was advanced to the proximal descending thoracic aorta. Wire was then removed. Double flush of the catheter was performed. Catheter was then used to select the left common carotid artery. With the coaxial configuration, glidewire was attempted to navigate into the left common carotid artery origin. We failed to access with this coaxial configuration, which was then removed on the 03/05 diagnostic wire. JB 1 catheter was then advanced on the diagnostic wire to the descending thoracic aorta. Wire was removed and the catheter was double flushed. Catheter was then used to engage the left common carotid artery origin. Standard Glidewire was advanced into the distal left common carotid artery and the JB 1 was advanced on the wire. Wire was removed with gentle contrast injection confirming location within the external carotid artery. The catheter was withdrawn into the common carotid artery with the Glidewire used to select the left ICA. Once the JB 1 catheter was advanced on the Glidewire into a distal cervical ICA position, the Glidewire was removed. An exchange length roadrunner wire was then placed. Catheter was removed. The coaxial parents teen catheter and wall wrist balloon guide were then advanced on the roadrunner wire to the distal ICA. With a distal position of the balloon guide achieved, the parents teen catheter and wire were removed with constant aspiration at the hub. Aspiration was  performed at the hub of the catheter. Angiogram was performed. Road map function was used once the occluded vessel was identified. Copious back flush was performed and the balloon catheter was attached to heparinized and pressurized saline bag for forward flow. A second coaxial system was then advanced through the balloon catheter, which included the selected intermediate catheter, microcatheter, and microwire. In this scenario, the set up included a zoom 55 137 cm intermediate catheter, a Trevo Provue18 microcatheter, and 014 synchro soft wire. This system was advanced through the balloon guide catheter under the road-map function, with adequate back-flush at the rotating hemostatic valve at that back end of the balloon guide. Microcatheter and the intermediate catheter system were advanced through the terminal ICA and MCA to the level of the occlusion. Microwire and catheter did not cross the occluded segment. The aspiration catheter was then advanced into the proximal MCA. Microcatheter and wire were removed with constant drip at the hub. The proprietary engine was then attached to the hub of the zoom catheter. Flow was confirmed and then the catheter was advanced into the occluded segment, observing cessation of flow. Catheter was then removed from the balloon guide under constant aspiration. Free aspiration was confirmed at the hub of the balloon guide catheter, with free blood return confirmed. Control angiogram was performed. Restoration of flow was confirmed. Angiogram of the cervical ICA was performed. Balloon guide was then removed. The skin at the puncture site was then cleaned with Chlorhexidine. The 8 French sheath was removed and a 7 [place] sheath was placed. 2 minutes of manual pressure were observed and then a 6 [place] Angio-Seal was deployed. This maneuver was performed, given that we have no stock available of 8 French Angio-Seal for hemostasis device. Flat panel CT was performed. Patient was  extubated once the CT was reviewed and a negative COVID test was confirmed. Patient tolerated the procedure well and remained hemodynamically stable throughout. No complications were encountered and no significant blood loss encountered. IMPRESSION: Status post ultrasound guided access right common femoral artery for left-sided cervical/cerebral angiogram and mechanical thrombectomy of left MCA occlusion, achieving TICI 3 flow. Angio-Seal for  hemostasis. Signed, Dulcy Fanny. Dellia Nims, RPVI [facility] Radiology PLAN: ICU status Target systolic blood pressure of 120-140 Right hip straight time 6 hours Frequent neurovascular checks Repeat neurologic imaging with CT and/MRI at the discretion of neurology team Electronically Signed   By: Corrie Mckusick D.O.   On: 09/27/2020 17:03   CT CEREBRAL PERFUSION W CONTRAST  Result Date: 09/27/2020 CLINICAL DATA:  Neuro deficit, acute, stroke suspected; Focal neuro deficit, > 6 hrs, stroke suspected EXAM: CT ANGIOGRAPHY HEAD AND NECK CT PERFUSION BRAIN TECHNIQUE: Multidetector CT imaging of the head and neck was performed using the standard protocol during bolus administration of intravenous contrast. Multiplanar CT image reconstructions and MIPs were obtained to evaluate the vascular anatomy. Carotid stenosis measurements (when applicable) are obtained utilizing NASCET criteria, using the distal internal carotid diameter as the denominator. Multiphase CT imaging of the brain was performed following IV bolus contrast injection. Subsequent parametric perfusion maps were calculated using RAPID software. CONTRAST:  157mL OMNIPAQUE IOHEXOL 350 MG/ML SOLN COMPARISON:  None. FINDINGS: CTA NECK FINDINGS Aortic arch: Only the superior most aspect of the aortic arch is imaged. Centrally nondiagnostic evaluation. Right carotid system: Nondiagnostic evaluation due to non arterial timing. Calcific atherosclerosis at the bifurcation. Left  carotid system: Nondiagnostic evaluation due to non arterial timing. Calcific atherosclerosis at the bifurcation. Vertebral arteries: Nondiagnostic evaluation due to non arterial timing. Calcific atherosclerosis of the intradural vertebral arteries. Skeleton: Moderate to severe degenerative disease at C5-C6. Other neck: Heterogeneously enlarged thyroid gland with 2.5 cm left thyroid nodule. This has been evaluated on previous imaging. (ref: J Am Coll Radiol. 2015 Feb;12(2): 143-50). Upper chest: Tree in bud consolidation in the visualized right upper lobe. Review of the MIP images confirms the above findings CTA HEAD FINDINGS Markedly degraded study due to poor contrast timing. Anterior circulation: Markedly degraded study due to poor contrast timing; however, there is loss of opacification of the distal left M1 or proximal M2 MCA in the anterior aspect of the sylvian fissure (series 10, images 85 through 88) which correlates with the hyperdensity seen on same day noncontrast head CT and is concerning for a high-grade stenosis or occlusion. Otherwise, severely limited evaluation of the anterior circulation. The intracranial ICAs appear to be opacified with atherosclerotic narrowing. Right M1 MCA and A1 ACA vessels appear to be opacified. Evaluation of the patency more distal ACA and MCA vessels is nondiagnostic for patency. Nondiagnostic evaluation of all vessels for stenosis. Posterior circulation: Grossly patent vertebral arteries and basilar artery. Proximal P1 PCAs appear opacified with essentially nondiagnostic evaluation for patency more distal PCAs. Nondiagnostic evaluation of all vessels for stenosis. Venous sinuses: As permitted by contrast timing, patent. Review of the MIP images confirms the above findings CT Brain Perfusion Findings: ASPECTS: 7 CBF (<30%) Volume: 20mL Perfusion (Tmax>6.0s) volume: 27mL Mismatch Volume: 70mL Infarction Location:Anterior left MCA territory, correlating with the hypodensity  seen on same day CT head. IMPRESSION: CTA: 1. Markedly limited study due to poor contrast timing; however, there is loss of opacification of the distal left M1 or proximal M2 MCA in the anterior aspect of the left sylvian fissure (series 10, images 85 through 88) which correlates with the hyperdensity seen on same day noncontrast head CT and is concerning for a high-grade stenosis or occlusion. 2. The remainder of the CTA is essentially nondiagnostic in the head and neck due to non arterial timing. A repeat bolus or repeat CTA at a later time could further evaluate if clinically  indicated. 3. Tree in bud consolidation in the visualized right upper lobe, which may represent aspiration (particularly given fluid layering in the esophagus) and/or pneumonia. Recommend dedicated chest imaging. CT Perfusion: Approximately 6 mL area of detected core infarct in the anterior left MCA territory which correlates with the hypodensity seen on same day CT head. In the more posterior left MCA territory there is approximately 37 mL of detected penumbra. Critical findings discussed with Dr. Cheral Marker via telephone at 2:15 p.m. Electronically Signed   By: Margaretha Sheffield MD   On: 09/27/2020 14:46   DG CHEST PORT 1 VIEW  Result Date: 09/28/2020 CLINICAL DATA:  Respiratory failure. Pneumonia. COMPARISON:  09/28/2020 FINDINGS: New feeding tube is seen entering the stomach. Mild cardiomegaly remains stable. Pulmonary opacity in the central right upper lobe remains stable, suspicious for pneumonia. No evidence of pleural effusion. IMPRESSION: New feeding tube is seen entering the stomach. No significant change in right upper lobe opacity, suspicious for pneumonia. Electronically Signed   By: Marlaine Hind M.D.   On: 09/28/2020 19:34   DG Foot 2 Views Right  Result Date: 09/28/2020 CLINICAL DATA:  Right toe swelling. EXAM: RIGHT FOOT - 2 VIEW COMPARISON:  None. FINDINGS: There is no evidence of fracture or dislocation. There is no  evidence of arthropathy or other focal bone abnormality. Soft tissues are unremarkable. IMPRESSION: Negative. Electronically Signed   By: Marijo Conception M.D.   On: 09/28/2020 13:59   ECHOCARDIOGRAM COMPLETE  Result Date: 09/28/2020    ECHOCARDIOGRAM REPORT   Patient Name:   JIMESHA RISING Date of Exam: 09/28/2020 Medical Rec #:  [id number]        Height:       63.0 in Accession #:    [id number]       Weight:       211.6 lb Date of Birth:  Oct 21, 1950         BSA:          1.981 m Patient Age:    76 years         BP:           126/57 mmHg Patient Gender: F                HR:           63 bpm. Exam Location:  Inpatient Procedure: 2D Echo, Cardiac Doppler, Color Doppler and Intracardiac            Opacification Agent Indications:    Stroke  History:        Patient has prior history of Echocardiogram examinations, most                 recent 06/19/2020. COPD, Arrythmias:PVC; Risk Factors:Diabetes.  Sonographer:    Clayton Lefort RDCS (AE) Referring Phys: [id number] [facility]  Sonographer Comments: Image acquisition challenging due to respiratory motion. IMPRESSIONS  1. Left ventricular ejection fraction, by estimation, is 60 to 65%. The left ventricle has normal function. The left ventricle has no regional wall motion abnormalities. There is moderate concentric left ventricular hypertrophy. Left ventricular diastolic parameters are consistent with Grade II diastolic dysfunction (pseudonormalization). Elevated left ventricular end-diastolic pressure.  2. Right ventricular systolic function is normal. The right ventricular size is normal. There is normal pulmonary artery systolic pressure.  3. Left atrial size was severely dilated.  4. Right atrial size was moderately dilated.  5. The mitral valve is degenerative. No evidence of mitral valve regurgitation. No evidence of  mitral stenosis.  6. The aortic valve is tricuspid. Aortic valve regurgitation is not visualized. Mild aortic valve stenosis. Aortic valve area, by VTI  measures 1.59 cm. Aortic valve mean gradient measures 13.0 mmHg. Aortic valve Vmax measures 2.57 m/s.  7. The inferior vena cava is dilated in size with >50% respiratory variability, suggesting right atrial pressure of 8 mmHg. FINDINGS  Left Ventricle: Left ventricular ejection fraction, by estimation, is 60 to 65%. The left ventricle has normal function. The left ventricle has no regional wall motion abnormalities. Definity contrast agent was given IV to delineate the left ventricular  endocardial borders. The left ventricular internal cavity size was normal in size. There is moderate concentric left ventricular hypertrophy. Left ventricular diastolic parameters are consistent with Grade II diastolic dysfunction (pseudonormalization).  Elevated left ventricular end-diastolic pressure. Right Ventricle: The right ventricular size is normal. No increase in right ventricular wall thickness. Right ventricular systolic function is normal. There is normal pulmonary artery systolic pressure. The tricuspid regurgitant velocity is 2.42 m/s, and  with an assumed right atrial pressure of 8 mmHg, the estimated right ventricular systolic pressure is 29.9 mmHg. Left Atrium: Left atrial size was severely dilated. Right Atrium: Right atrial size was moderately dilated. Pericardium: Trivial pericardial effusion is present. Mitral Valve: The mitral valve is degenerative in appearance. No evidence of mitral valve regurgitation. No evidence of mitral valve stenosis. MV peak gradient, 7.6 mmHg. The mean mitral valve gradient is 3.0 mmHg. Tricuspid Valve: The tricuspid valve is normal in structure. Tricuspid valve regurgitation is trivial. No evidence of tricuspid stenosis. Aortic Valve: The aortic valve is tricuspid. Aortic valve regurgitation is not visualized. Mild aortic stenosis is present. Aortic valve mean gradient measures 13.0 mmHg. Aortic valve peak gradient measures 26.4 mmHg. Aortic valve area, by VTI measures 1.59 cm.  Pulmonic Valve: The pulmonic valve was normal in structure. Pulmonic valve regurgitation is not visualized. No evidence of pulmonic stenosis. Aorta: The aortic root is normal in size and structure. Venous: The inferior vena cava is dilated in size with greater than 50% respiratory variability, suggesting right atrial pressure of 8 mmHg. IAS/Shunts: No atrial level shunt detected by color flow Doppler.  LEFT VENTRICLE PLAX 2D LVIDd:         4.90 cm  Diastology LVIDs:         3.10 cm  LV e' medial:    7.29 cm/s LV PW:         1.40 cm  LV E/e' medial:  20.0 LV IVS:        1.60 cm  LV e' lateral:   9.14 cm/s LVOT diam:     1.80 cm  LV E/e' lateral: 16.0 LV SV:         93 LV SV Index:   47 LVOT Area:     2.54 cm  RIGHT VENTRICLE             IVC RV Basal diam:  3.10 cm     IVC diam: 2.70 cm RV S prime:     15.70 cm/s TAPSE (M-mode): 2.5 cm LEFT ATRIUM             Index       RIGHT ATRIUM           Index LA diam:        4.00 cm 2.02 cm/m  RA Area:     22.30 cm LA Vol (A2C):   74.8 ml 37.76 ml/m RA Volume:   65.40 ml  33.02 ml/m LA Vol (A4C):   80.7 ml 40.74 ml/m LA Biplane Vol: 80.0 ml 40.39 ml/m  AORTIC VALVE AV Area (Vmax):    1.44 cm AV Area (Vmean):   1.47 cm AV Area (VTI):     1.59 cm AV Vmax:           257.00 cm/s AV Vmean:          168.000 cm/s AV VTI:            0.584 m AV Peak Grad:      26.4 mmHg AV Mean Grad:      13.0 mmHg LVOT Vmax:         145.00 cm/s LVOT Vmean:        96.900 cm/s LVOT VTI:          0.364 m LVOT/AV VTI ratio: 0.62  AORTA Ao Root diam: 2.70 cm Ao Asc diam:  3.20 cm MITRAL VALVE                TRICUSPID VALVE MV Area (PHT): 2.60 cm     TR Peak grad:   23.4 mmHg MV Area VTI:   2.12 cm     TR Vmax:        242.00 cm/s MV Peak grad:  7.6 mmHg MV Mean grad:  3.0 mmHg     SHUNTS MV Vmax:       1.38 m/s     Systemic VTI:  0.36 m MV Vmean:      72.2 cm/s    Systemic Diam: 1.80 cm MV Decel Time: 292 msec MV E velocity: 146.00 cm/s MV A velocity: 99.30 cm/s MV E/A ratio:  1.47 Skeet Latch MD Electronically signed by Skeet Latch MD Signature Date/Time: 09/28/2020/7:07:22 PM    Final    IR PERCUTANEOUS ART THROMBECTOMY/INFUSION INTRACRANIAL INC DIAG ANGIO  Result Date: 09/27/2020 INDICATION: 70 year old female presents with acute stroke, left MCA syndrome. She presents for cerebral angiogram and attempt for mechanical thrombectomy to treat emergent large vessel occlusion. EXAM: ULTRASOUND-GUIDED ACCESS RIGHT COMMON FEMORAL ARTERY LEFT-SIDED CERVICAL AND CEREBRAL ANGIOGRAM MECHANICAL THROMBECTOMY LEFT MCA DEPLOYMENT OF ANGIO-SEAL FOR HEMOSTASIS COMPARISON:  CT imaging same date MEDICATIONS: None ANESTHESIA/SEDATION: The anesthesia team was present to provide general endotracheal tube anesthesia and for patient monitoring during the procedure. Intubation was performed in biplane room 2. left radial arterial line was performed by the anesthesia team. Interventional neuro radiology nursing staff was also present. CONTRAST:  70 cc Omnipaque 300 FLUOROSCOPY TIME:  Fluoroscopy Time: 14 minutes 54 seconds (779.8 mGy). COMPLICATIONS: None TECHNIQUE: Informed written consent was obtained from the patient's family after a thorough discussion of the procedural risks, benefits and alternatives by our stroke neurology team. Specific risks discussed include: Bleeding, infection, contrast reaction, kidney injury/failure, need for further procedure/surgery, arterial injury or dissection, embolization to new territory, intracranial hemorrhage (10-15% risk), neurologic deterioration, cardiopulmonary collapse, death. All questions were addressed. Maximal Sterile Barrier Technique was utilized including during the procedure including caps, mask, sterile gowns, sterile gloves, sterile drape, hand hygiene and skin antiseptic. A timeout was performed prior to the initiation of the procedure. The anesthesia team was present to provide general endotracheal tube anesthesia and for patient monitoring during the  procedure. Interventional neuro radiology nursing staff was also present. FINDINGS: Initial Findings: Aorta: Atherosclerotic plaque of the aortic arch. The origin of the left common carotid artery head acute angulation with atherosclerotic plaque at the origin. Left common carotid artery: Angulation at the CCA origin with  atherosclerotic plaque. Left external carotid artery: Patent with antegrade flow. Left internal carotid artery: Normal course caliber and contour of the cervical portion. Vertical and petrous segment patent with normal course caliber contour. Cavernous segment patent. Clinoid segment patent. Antegrade flow of the ophthalmic artery. Ophthalmic segment patent. Terminus patent. Left MCA: Proximal MCA is patent. There is a proximal origin of the inferior division/temporal branch which remains patent and perfuses the majority of the left temporal lobe. There is also a proximal frontal branch, which remains patent just proximal to the occlusion site, perfusing the pre frontal region. Significant leptomeningeal collaterals on the initial injection, filling significant portion of the MCA territory of the left hemisphere. The occlusion is in the dominant superior division. Within the target artery: TICI 0: No perfusion or antegrade flow beyond site of occlusion. Left ACA: A 1 segment patent. A 2 segment perfuses the left territory. There is some cross flow of the anterior communicating artery, with transient opacification of the right ACA. Completion Findings: Left MCA: After single pass with direct aspiration strategy there is complete restoration of flow through the MCA. Completion: TICI 3: Complete perfusion of the territory affected by the occluded artery Flat panel CT performed demonstrating no evidence of hemorrhage. Mild contrast staining within the left paracentral lobule. PROCEDURE: The anesthesia team was present to provide general endotracheal tube anesthesia and for patient monitoring during the  procedure. Intubation was performed in negative pressure [place] in neuro IR holding. Interventional neuro radiology nursing staff was also present. Ultrasound survey of the right inguinal region was performed with images stored and sent to PACs. 11 blade scalpel was used to make a small incision. Blunt dissection was performed with US guidance. A micropuncture needle was used access the right common femoral artery under ultrasound. With excellent arterial blood flow returned, an .018 micro wire was passed through the needle, observed to enter the abdominal aorta under fluoroscopy. The needle was removed, and a micropuncture sheath was placed over the wire. The inner dilator and wire were removed, and an 035 wire was advanced under fluoroscopy into the abdominal aorta. The sheath was removed and a 25cm 29F straight vascular sheath was placed. The dilator was removed and the sheath was flushed. Sheath was attached to pressurized and heparinized saline bag for constant forward flow. A coaxial system was then advanced over the 035 wire. This included a 95cm 087 "Walrus" balloon guide with coaxial 125cm Berenstein diagnostic catheter. This was advanced to the proximal descending thoracic aorta. Wire was then removed. Double flush of the catheter was performed. Catheter was then used to select the left common carotid artery. With the coaxial configuration, glidewire was attempted to navigate into the left common carotid artery origin. We failed to access with this coaxial configuration, which was then removed on the 03/05 diagnostic wire. JB 1 catheter was then advanced on the diagnostic wire to the descending thoracic aorta. Wire was removed and the catheter was double flushed. Catheter was then used to engage the left common carotid artery origin. Standard Glidewire was advanced into the distal left common carotid artery and the JB 1 was advanced on the wire. Wire was removed with gentle contrast injection confirming  location within the external carotid artery. The catheter was withdrawn into the common carotid artery with the Glidewire used to select the left ICA. Once the JB 1 catheter was advanced on the Glidewire into a distal cervical ICA position, the Glidewire was removed. An exchange length roadrunner wire was then placed.  Catheter was removed. The coaxial parents teen catheter and wall wrist balloon guide were then advanced on the roadrunner wire to the distal ICA. With a distal position of the balloon guide achieved, the parents teen catheter and wire were removed with constant aspiration at the hub. Aspiration was performed at the hub of the catheter. Angiogram was performed. Road map function was used once the occluded vessel was identified. Copious back flush was performed and the balloon catheter was attached to heparinized and pressurized saline bag for forward flow. A second coaxial system was then advanced through the balloon catheter, which included the selected intermediate catheter, microcatheter, and microwire. In this scenario, the set up included a zoom 55 137 cm intermediate catheter, a Trevo Provue18 microcatheter, and 014 synchro soft wire. This system was advanced through the balloon guide catheter under the road-map function, with adequate back-flush at the rotating hemostatic valve at that back end of the balloon guide. Microcatheter and the intermediate catheter system were advanced through the terminal ICA and MCA to the level of the occlusion. Microwire and catheter did not cross the occluded segment. The aspiration catheter was then advanced into the proximal MCA. Microcatheter and wire were removed with constant drip at the hub. The proprietary engine was then attached to the hub of the zoom catheter. Flow was confirmed and then the catheter was advanced into the occluded segment, observing cessation of flow. Catheter was then removed from the balloon guide under constant aspiration. Free  aspiration was confirmed at the hub of the balloon guide catheter, with free blood return confirmed. Control angiogram was performed. Restoration of flow was confirmed. Angiogram of the cervical ICA was performed. Balloon guide was then removed. The skin at the puncture site was then cleaned with Chlorhexidine. The 8 French sheath was removed and a 7 [place] sheath was placed. 2 minutes of manual pressure were observed and then a 6 [place] Angio-Seal was deployed. This maneuver was performed, given that we have no stock available of 8 French Angio-Seal for hemostasis device. Flat panel CT was performed. Patient was extubated once the CT was reviewed and a negative COVID test was confirmed. Patient tolerated the procedure well and remained hemodynamically stable throughout. No complications were encountered and no significant blood loss encountered. IMPRESSION: Status post ultrasound guided access right common femoral artery for left-sided cervical/cerebral angiogram and mechanical thrombectomy of left MCA occlusion, achieving TICI 3 flow. Angio-Seal for hemostasis. Signed, Dulcy Fanny. Dellia Nims, RPVI [facility] Radiology PLAN: ICU status Target systolic blood pressure of 120-140 Right hip straight time 6 hours Frequent neurovascular checks Repeat neurologic imaging with CT and/MRI at the discretion of neurology team Electronically Signed   By: Corrie Mckusick D.O.   On: 09/27/2020 17:03   CT HEAD CODE STROKE WO CONTRAST  Addendum Date: 09/27/2020   ADDENDUM REPORT: 09/27/2020 14:47 ADDENDUM: In addition to the text page detailed in the report, findings also conveyed to Dr. Cheral Marker via telephone at 2:15 p.m. Electronically Signed   By: Margaretha Sheffield MD   On: 09/27/2020 14:47   Result Date: 09/27/2020 CLINICAL DATA:  Code stroke.  Neuro deficit, acute stroke suspected. EXAM: CT HEAD WITHOUT CONTRAST TECHNIQUE: Contiguous axial images were obtained from the base of  the skull through the vertex without intravenous contrast. COMPARISON:  None. FINDINGS: Brain: Abnormal edema and loss of gray-white differentiation involving the left insula and overlying frontal lobe, concerning for acute left MCA infarct. There is mild local mass effect  without midline shift. No evidence of acute hemorrhage. No hydrocephalus. No mass lesion or midline shift. No extra-axial fluid collection. Basal cisterns are patent. Vascular: Hyperdense left MCA vessel in the anterior aspect of the sylvian fissure (series 5, image 39), concerning for thrombosed proximal M2 or distal M1 MCA artery. Calcific atherosclerosis. Skull: No acute fracture. Sinuses/Orbits: Small mild layering fluid in bilateral maxillary sinuses. Other: No mastoid effusions. ASPECTS [facility] Stroke Program Early CT Score) - Ganglionic level infarction (caudate, lentiform nuclei, internal capsule, insula, M1-M3 cortex): 5 - Supraganglionic infarction (M4-M6 cortex): 2 Total score (0-10 with 10 being normal): 7 Dr. Cheral Marker paged at 2:05 PM for call of report, awaiting call back. To avoid delay findings also communicated at 2:08 pm to Dr. Cheral Marker via secure text paging. IMPRESSION: 1. Findings compatible with an acute left MCA territory infarct, as detailed above. ASPECTS is 7. 2. Hyperdense left MCA vessel in the anterior aspect of the sylvian fissure (series 5, image 39), concerning for thrombosed proximal M2 or distal M1 MCA artery. Recommend CTA. 3. No evidence of acute hemorrhage. Electronically Signed: By: Margaretha Sheffield MD On: 09/27/2020 14:11   VAS [place] LOWER EXTREMITY VENOUS (DVT)  Result Date: 09/28/2020  Lower Venous DVT Study Patient Name:  Kathleen [name]  Date of Exam:   09/28/2020 Medical Rec #: [id number]         Accession #:    [id number] Date of Birth: 1950/09/09          Patient Gender: F Patient Age:   70Y Exam Location:  [facility] Procedure:      VAS [place] LOWER EXTREMITY VENOUS (DVT) Referring Phys: [id number]  Rosalin Hawking --------------------------------------------------------------------------------  Indications: Stroke.  Limitations: Body habitus. Comparison Study: No previous exams Performing Technologist: Jody Hill RVT, RDMS  Examination Guidelines: A complete evaluation includes B-mode imaging, spectral Doppler, color Doppler, and power Doppler as needed of all accessible portions of each vessel. Bilateral testing is considered an integral part of a complete examination. Limited examinations for reoccurring indications may be performed as noted. The reflux portion of the exam is performed with the patient in reverse Trendelenburg.  +---------+---------------+---------+-----------+----------+------------------+ RIGHT    CompressibilityPhasicitySpontaneityPropertiesThrombus Aging     +---------+---------------+---------+-----------+----------+------------------+ CFV      Full           Yes      Yes                                     +---------+---------------+---------+-----------+----------+------------------+ SFJ      Full                                                            +---------+---------------+---------+-----------+----------+------------------+ FV Prox  Full           Yes      Yes                                     +---------+---------------+---------+-----------+----------+------------------+ FV Mid   Full           Yes      Yes                                     +---------+---------------+---------+-----------+----------+------------------+  FV DistalFull           Yes      Yes                                     +---------+---------------+---------+-----------+----------+------------------+ PFV      Full                                                            +---------+---------------+---------+-----------+----------+------------------+ POP      Full           Yes      Yes                                      +---------+---------------+---------+-----------+----------+------------------+ PTV      Full                                                            +---------+---------------+---------+-----------+----------+------------------+ PERO                                                  Not seen on this                                                         exam               +---------+---------------+---------+-----------+----------+------------------+   +---------+---------------+---------+-----------+----------+--------------+ LEFT     CompressibilityPhasicitySpontaneityPropertiesThrombus Aging +---------+---------------+---------+-----------+----------+--------------+ CFV      Full           Yes      Yes                                 +---------+---------------+---------+-----------+----------+--------------+ SFJ      Full                                                        +---------+---------------+---------+-----------+----------+--------------+ FV Prox  Full           Yes      Yes                                 +---------+---------------+---------+-----------+----------+--------------+ FV Mid   Full           Yes      Yes                                 +---------+---------------+---------+-----------+----------+--------------+  FV DistalFull           Yes      Yes                                 +---------+---------------+---------+-----------+----------+--------------+ PFV      Full                                                        +---------+---------------+---------+-----------+----------+--------------+ POP      Full           Yes      Yes                                 +---------+---------------+---------+-----------+----------+--------------+ PTV      Full                                                        +---------+---------------+---------+-----------+----------+--------------+ PERO     Full                                                         +---------+---------------+---------+-----------+----------+--------------+     Summary: BILATERAL: - No evidence of deep vein thrombosis seen in the lower extremities, bilaterally. - No evidence of superficial venous thrombosis in the lower extremities, bilaterally. -No evidence of popliteal cyst, bilaterally.   *See table(s) above for measurements and observations. Electronically signed by Deitra Mayo MD on 09/28/2020 at 5:23:00 PM.    Final    CT ANGIO HEAD CODE STROKE  Result Date: 09/27/2020 CLINICAL DATA:  Neuro deficit, acute, stroke suspected; Focal neuro deficit, > 6 hrs, stroke suspected EXAM: CT ANGIOGRAPHY HEAD AND NECK CT PERFUSION BRAIN TECHNIQUE: Multidetector CT imaging of the head and neck was performed using the standard protocol during bolus administration of intravenous contrast. Multiplanar CT image reconstructions and MIPs were obtained to evaluate the vascular anatomy. Carotid stenosis measurements (when applicable) are obtained utilizing NASCET criteria, using the distal internal carotid diameter as the denominator. Multiphase CT imaging of the brain was performed following IV bolus contrast injection. Subsequent parametric perfusion maps were calculated using RAPID software. CONTRAST:  170mL OMNIPAQUE IOHEXOL 350 MG/ML SOLN COMPARISON:  None. FINDINGS: CTA NECK FINDINGS Aortic arch: Only the superior most aspect of the aortic arch is imaged. Centrally nondiagnostic evaluation. Right carotid system: Nondiagnostic evaluation due to non arterial timing. Calcific atherosclerosis at the bifurcation. Left carotid system: Nondiagnostic evaluation due to non arterial timing. Calcific atherosclerosis at the bifurcation. Vertebral arteries: Nondiagnostic evaluation due to non arterial timing. Calcific atherosclerosis of the intradural vertebral arteries. Skeleton: Moderate to severe degenerative disease at C5-C6. Other neck:  Heterogeneously enlarged thyroid gland with 2.5 cm left thyroid nodule. This has been evaluated on previous imaging. (ref: J Am Coll Radiol. 2015 Feb;12(2): 143-50). Upper chest: Tree in bud consolidation in the visualized right upper lobe. Review of the MIP  images confirms the above findings CTA HEAD FINDINGS Markedly degraded study due to poor contrast timing. Anterior circulation: Markedly degraded study due to poor contrast timing; however, there is loss of opacification of the distal left M1 or proximal M2 MCA in the anterior aspect of the sylvian fissure (series 10, images 85 through 88) which correlates with the hyperdensity seen on same day noncontrast head CT and is concerning for a high-grade stenosis or occlusion. Otherwise, severely limited evaluation of the anterior circulation. The intracranial ICAs appear to be opacified with atherosclerotic narrowing. Right M1 MCA and A1 ACA vessels appear to be opacified. Evaluation of the patency more distal ACA and MCA vessels is nondiagnostic for patency. Nondiagnostic evaluation of all vessels for stenosis. Posterior circulation: Grossly patent vertebral arteries and basilar artery. Proximal P1 PCAs appear opacified with essentially nondiagnostic evaluation for patency more distal PCAs. Nondiagnostic evaluation of all vessels for stenosis. Venous sinuses: As permitted by contrast timing, patent. Review of the MIP images confirms the above findings CT Brain Perfusion Findings: ASPECTS: 7 CBF (<30%) Volume: 46mL Perfusion (Tmax>6.0s) volume: 60mL Mismatch Volume: 33mL Infarction Location:Anterior left MCA territory, correlating with the hypodensity seen on same day CT head. IMPRESSION: CTA: 1. Markedly limited study due to poor contrast timing; however, there is loss of opacification of the distal left M1 or proximal M2 MCA in the anterior aspect of the left sylvian fissure (series 10, images 85 through 88) which correlates with the hyperdensity seen on same day  noncontrast head CT and is concerning for a high-grade stenosis or occlusion. 2. The remainder of the CTA is essentially nondiagnostic in the head and neck due to non arterial timing. A repeat bolus or repeat CTA at a later time could further evaluate if clinically indicated. 3. Tree in bud consolidation in the visualized right upper lobe, which may represent aspiration (particularly given fluid layering in the esophagus) and/or pneumonia. Recommend dedicated chest imaging. CT Perfusion: Approximately 6 mL area of detected core infarct in the anterior left MCA territory which correlates with the hypodensity seen on same day CT head. In the more posterior left MCA territory there is approximately 37 mL of detected penumbra. Critical findings discussed with Dr. Cheral Marker via telephone at 2:15 p.m. Electronically Signed   By: Margaretha Sheffield MD   On: 09/27/2020 14:46   CT ANGIO NECK CODE STROKE  Result Date: 09/27/2020 CLINICAL DATA:  Neuro deficit, acute, stroke suspected; Focal neuro deficit, > 6 hrs, stroke suspected EXAM: CT ANGIOGRAPHY HEAD AND NECK CT PERFUSION BRAIN TECHNIQUE: Multidetector CT imaging of the head and neck was performed using the standard protocol during bolus administration of intravenous contrast. Multiplanar CT image reconstructions and MIPs were obtained to evaluate the vascular anatomy. Carotid stenosis measurements (when applicable) are obtained utilizing NASCET criteria, using the distal internal carotid diameter as the denominator. Multiphase CT imaging of the brain was performed following IV bolus contrast injection. Subsequent parametric perfusion maps were calculated using RAPID software. CONTRAST:  166mL OMNIPAQUE IOHEXOL 350 MG/ML SOLN COMPARISON:  None. FINDINGS: CTA NECK FINDINGS Aortic arch: Only the superior most aspect of the aortic arch is imaged. Centrally nondiagnostic evaluation. Right carotid system: Nondiagnostic evaluation due to non arterial timing. Calcific  atherosclerosis at the bifurcation. Left carotid system: Nondiagnostic evaluation due to non arterial timing. Calcific atherosclerosis at the bifurcation. Vertebral arteries: Nondiagnostic evaluation due to non arterial timing. Calcific atherosclerosis of the intradural vertebral arteries. Skeleton: Moderate to severe degenerative disease at C5-C6. Other neck: Heterogeneously enlarged thyroid  gland with 2.5 cm left thyroid nodule. This has been evaluated on previous imaging. (ref: J Am Coll Radiol. 2015 Feb;12(2): 143-50). Upper chest: Tree in bud consolidation in the visualized right upper lobe. Review of the MIP images confirms the above findings CTA HEAD FINDINGS Markedly degraded study due to poor contrast timing. Anterior circulation: Markedly degraded study due to poor contrast timing; however, there is loss of opacification of the distal left M1 or proximal M2 MCA in the anterior aspect of the sylvian fissure (series 10, images 85 through 88) which correlates with the hyperdensity seen on same day noncontrast head CT and is concerning for a high-grade stenosis or occlusion. Otherwise, severely limited evaluation of the anterior circulation. The intracranial ICAs appear to be opacified with atherosclerotic narrowing. Right M1 MCA and A1 ACA vessels appear to be opacified. Evaluation of the patency more distal ACA and MCA vessels is nondiagnostic for patency. Nondiagnostic evaluation of all vessels for stenosis. Posterior circulation: Grossly patent vertebral arteries and basilar artery. Proximal P1 PCAs appear opacified with essentially nondiagnostic evaluation for patency more distal PCAs. Nondiagnostic evaluation of all vessels for stenosis. Venous sinuses: As permitted by contrast timing, patent. Review of the MIP images confirms the above findings CT Brain Perfusion Findings: ASPECTS: 7 CBF (<30%) Volume: 47mL Perfusion (Tmax>6.0s) volume: 40mL Mismatch Volume: 61mL Infarction Location:Anterior left MCA  territory, correlating with the hypodensity seen on same day CT head. IMPRESSION: CTA: 1. Markedly limited study due to poor contrast timing; however, there is loss of opacification of the distal left M1 or proximal M2 MCA in the anterior aspect of the left sylvian fissure (series 10, images 85 through 88) which correlates with the hyperdensity seen on same day noncontrast head CT and is concerning for a high-grade stenosis or occlusion. 2. The remainder of the CTA is essentially nondiagnostic in the head and neck due to non arterial timing. A repeat bolus or repeat CTA at a later time could further evaluate if clinically indicated. 3. Tree in bud consolidation in the visualized right upper lobe, which may represent aspiration (particularly given fluid layering in the esophagus) and/or pneumonia. Recommend dedicated chest imaging. CT Perfusion: Approximately 6 mL area of detected core infarct in the anterior left MCA territory which correlates with the hypodensity seen on same day CT head. In the more posterior left MCA territory there is approximately 37 mL of detected penumbra. Critical findings discussed with Dr. Cheral Marker via telephone at 2:15 p.m. Electronically Signed   By: Margaretha Sheffield MD   On: 09/27/2020 14:46      Subjective: Patient seen and examined the bedside this morning.  Hemodynamically stable for discharge today to the skilled nursing facility.  Discharge Exam: Vitals:   10/09/20 0319 10/09/20 0756  BP: (!) 160/83 (!) 143/75  Pulse: 69 69  Resp: 18 18  Temp: 98 F (36.7 C) 97.9 F (36.6 C)  SpO2: 100%    Vitals:   10/08/20 2302 10/09/20 0319 10/09/20 0500 10/09/20 0756  BP: (!) 116/50 (!) 160/83  (!) 143/75  Pulse: 71 69  69  Resp: 18 18  18   Temp: 98.7 F (37.1 C) 98 F (36.7 C)  97.9 F (36.6 C)  TempSrc: Oral Oral  Oral  SpO2: 96% 100%    Weight:   94.8 kg   Height:        General: Pt is alert, awake, not in acute distress, obese, right facial droop, aphasic,  right-sided weakness. Cardiovascular: RRR, S1/S2 +, no rubs, no  gallops Respiratory: CTA bilaterally, no wheezing, no rhonchi Abdominal: Soft, NT, ND, bowel sounds + Extremities: no edema, no cyanosis    The results of significant diagnostics from this hospitalization (including imaging, microbiology, ancillary and laboratory) are listed below for reference.     Microbiology: Recent Results (from the past 240 hour(s))  Urine Culture     Status: Abnormal   Collection Time: 10/03/20  2:52 PM   Specimen: Urine, Clean Catch  Result Value Ref Range Status   Specimen Description URINE, CLEAN CATCH  Final   Special Requests   Final    NONE Performed at [facility], [place] [address], [place], [place] [address]    Culture (A)  Final    20,000 COLONIES/mL ESCHERICHIA COLI >=100,000 COLONIES/mL ENTEROCOCCUS FAECALIS    Report Status 10/06/2020 FINAL  Final   Organism ID, Bacteria ESCHERICHIA COLI (A)  Final   Organism ID, Bacteria ENTEROCOCCUS FAECALIS (A)  Final      Susceptibility   Escherichia coli - MIC*    AMPICILLIN 8 SENSITIVE Sensitive     CEFAZOLIN <=4 SENSITIVE Sensitive     CEFEPIME <=0.12 SENSITIVE Sensitive     CEFTRIAXONE <=0.25 SENSITIVE Sensitive     CIPROFLOXACIN <=0.25 SENSITIVE Sensitive     GENTAMICIN <=1 SENSITIVE Sensitive     IMIPENEM <=0.25 SENSITIVE Sensitive     NITROFURANTOIN <=16 SENSITIVE Sensitive     TRIMETH/SULFA <=20 SENSITIVE Sensitive     AMPICILLIN/SULBACTAM 4 SENSITIVE Sensitive     PIP/TAZO <=4 SENSITIVE Sensitive     * 20,000 COLONIES/mL ESCHERICHIA COLI   Enterococcus faecalis - MIC*    AMPICILLIN <=2 SENSITIVE Sensitive     NITROFURANTOIN <=16 SENSITIVE Sensitive     VANCOMYCIN 2 SENSITIVE Sensitive     * >=100,000 COLONIES/mL ENTEROCOCCUS FAECALIS  Resp Panel by RT-PCR (Flu A&B, Covid) Nasopharyngeal Swab     Status: None   Collection Time: 10/08/20  4:51 PM   Specimen: Nasopharyngeal Swab; Nasopharyngeal(NP) swabs in vial  transport medium  Result Value Ref Range Status   SARS Coronavirus 2 by RT PCR NEGATIVE NEGATIVE Final    Comment: (NOTE) SARS-CoV-2 target nucleic acids are NOT DETECTED.  The SARS-CoV-2 RNA is generally detectable in upper respiratory specimens during the acute phase of infection. The lowest concentration of SARS-CoV-2 viral copies this assay can detect is 138 copies/mL. A negative result does not preclude SARS-Cov-2 infection and should not be used as the sole basis for treatment or other patient management decisions. A negative result may occur with  improper specimen collection/handling, submission of specimen other than nasopharyngeal swab, presence of viral mutation(s) within the areas targeted by this assay, and inadequate number of viral copies(<138 copies/mL). A negative result must be combined with clinical observations, patient history, and epidemiological information. The expected result is Negative.  Fact Sheet for Patients:  EntrepreneurPulse.com.au  Fact Sheet for Healthcare Providers:  IncredibleEmployment.be  This test is no t yet approved or cleared by the [place] FDA and  has been authorized for detection and/or diagnosis of SARS-CoV-2 by FDA under an Emergency Use Authorization (EUA). This EUA will remain  in effect (meaning this test can be used) for the duration of the COVID-19 declaration under Section 564(b)(1) of the Act, 21 U.S.C.section 360bbb-3(b)(1), unless the authorization is terminated  or revoked sooner.       Influenza A by PCR NEGATIVE NEGATIVE Final   Influenza B by PCR NEGATIVE NEGATIVE Final    Comment: (NOTE) The Xpert Xpress  SARS-CoV-2/FLU/RSV plus assay is intended as an aid in the diagnosis of influenza from Nasopharyngeal swab specimens and should not be used as a sole basis for treatment. Nasal washings and aspirates are unacceptable for Xpert Xpress SARS-CoV-2/FLU/RSV testing.  Fact  Sheet for Patients: EntrepreneurPulse.com.au  Fact Sheet for Healthcare Providers: IncredibleEmployment.be  This test is not yet approved or cleared by the [place] FDA and has been authorized for detection and/or diagnosis of SARS-CoV-2 by FDA under an Emergency Use Authorization (EUA). This EUA will remain in effect (meaning this test can be used) for the duration of the COVID-19 declaration under Section 564(b)(1) of the Act, 21 U.S.C. section 360bbb-3(b)(1), unless the authorization is terminated or revoked.  Performed at [facility], [place] [address], [place], [place] [address]      Labs: BNP (last 3 results) No results for input(s): BNP in the last 8760 hours. Basic Metabolic Panel: Recent Labs  Lab 10/03/20 0210 10/04/20 0020 10/05/20 0358 10/06/20 0318  NA 142 142 146* 146*  K 4.1 4.5 4.3 4.3  CL 105 105 107 107  CO2 26 25 31 28   GLUCOSE 285* 283* 196* 62*  BUN 88* 91* 93* 87*  CREATININE 4.02* 3.86* 3.39* 3.14*  CALCIUM 8.2* 8.3* 8.7* 9.2  MG 2.2 2.1 2.4 2.4  PHOS  --  3.9  --   --    Liver Function Tests: Recent Labs  Lab 10/04/20 0020  AST 18  ALT 9  ALKPHOS 85  BILITOT 0.6  PROT 5.1*  ALBUMIN 2.1*   No results for input(s): LIPASE, AMYLASE in the last 168 hours. No results for input(s): AMMONIA in the last 168 hours. CBC: Recent Labs  Lab 10/04/20 0020 10/05/20 0358 10/06/20 0318 10/07/20 0404 10/08/20 0701  WBC 7.5 9.2 13.0* 14.9* 14.0*  HGB 10.8* 10.9* 10.6* 11.4* 10.7*  HCT 34.4* 34.3* 34.7* 36.5 34.5*  MCV 91.2 92.2 93.8 92.2 92.5  PLT 326 333 346 340 366   Cardiac Enzymes: No results for input(s): CKTOTAL, CKMB, CKMBINDEX, TROPONINI in the last 168 hours. BNP: Invalid input(s): POCBNP CBG: Recent Labs  Lab 10/08/20 0938 10/08/20 1201 10/08/20 1620 10/08/20 2019 10/09/20 0634  GLUCAP 226* 270* 318* 336* 343*   D-Dimer No results for input(s): DDIMER in the last 72 hours. Hgb  A1c No results for input(s): HGBA1C in the last 72 hours. Lipid Profile No results for input(s): CHOL, HDL, LDLCALC, TRIG, CHOLHDL, LDLDIRECT in the last 72 hours. Thyroid function studies No results for input(s): TSH, T4TOTAL, T3FREE, THYROIDAB in the last 72 hours.  Invalid input(s): FREET3 Anemia work up No results for input(s): VITAMINB12, FOLATE, FERRITIN, TIBC, IRON, RETICCTPCT in the last 72 hours. Urinalysis    Component Value Date/Time   COLORURINE YELLOW 10/03/2020 1151   APPEARANCEUR HAZY (A) 10/03/2020 1151   LABSPEC 1.011 10/03/2020 1151   PHURINE 7.0 10/03/2020 1151   GLUCOSEU >=500 (A) 10/03/2020 1151   HGBUR SMALL (A) 10/03/2020 1151   BILIRUBINUR NEGATIVE 10/03/2020 1151   KETONESUR NEGATIVE 10/03/2020 1151   PROTEINUR 100 (A) 10/03/2020 1151   NITRITE NEGATIVE 10/03/2020 1151   LEUKOCYTESUR LARGE (A) 10/03/2020 1151   Sepsis Labs Invalid input(s): PROCALCITONIN,  WBC,  LACTICIDVEN Microbiology Recent Results (from the past 240 hour(s))  Urine Culture     Status: Abnormal   Collection Time: 10/03/20  2:52 PM   Specimen: Urine, Clean Catch  Result Value Ref Range Status   Specimen Description URINE, CLEAN CATCH  Final   Special  Requests   Final    NONE Performed at [facility], [place] [address] [place], [place] [address]    Culture (A)  Final    20,000 COLONIES/mL ESCHERICHIA COLI >=100,000 COLONIES/mL ENTEROCOCCUS FAECALIS    Report Status 10/06/2020 FINAL  Final   Organism ID, Bacteria ESCHERICHIA COLI (A)  Final   Organism ID, Bacteria ENTEROCOCCUS FAECALIS (A)  Final      Susceptibility   Escherichia coli - MIC*    AMPICILLIN 8 SENSITIVE Sensitive     CEFAZOLIN <=4 SENSITIVE Sensitive     CEFEPIME <=0.12 SENSITIVE Sensitive     CEFTRIAXONE <=0.25 SENSITIVE Sensitive     CIPROFLOXACIN <=0.25 SENSITIVE Sensitive     GENTAMICIN <=1 SENSITIVE Sensitive     IMIPENEM <=0.25 SENSITIVE Sensitive     NITROFURANTOIN <=16 SENSITIVE Sensitive      TRIMETH/SULFA <=20 SENSITIVE Sensitive     AMPICILLIN/SULBACTAM 4 SENSITIVE Sensitive     PIP/TAZO <=4 SENSITIVE Sensitive     * 20,000 COLONIES/mL ESCHERICHIA COLI   Enterococcus faecalis - MIC*    AMPICILLIN <=2 SENSITIVE Sensitive     NITROFURANTOIN <=16 SENSITIVE Sensitive     VANCOMYCIN 2 SENSITIVE Sensitive     * >=100,000 COLONIES/mL ENTEROCOCCUS FAECALIS  Resp Panel by RT-PCR (Flu A&B, Covid) Nasopharyngeal Swab     Status: None   Collection Time: 10/08/20  4:51 PM   Specimen: Nasopharyngeal Swab; Nasopharyngeal(NP) swabs in vial transport medium  Result Value Ref Range Status   SARS Coronavirus 2 by RT PCR NEGATIVE NEGATIVE Final    Comment: (NOTE) SARS-CoV-2 target nucleic acids are NOT DETECTED.  The SARS-CoV-2 RNA is generally detectable in upper respiratory specimens during the acute phase of infection. The lowest concentration of SARS-CoV-2 viral copies this assay can detect is 138 copies/mL. A negative result does not preclude SARS-Cov-2 infection and should not be used as the sole basis for treatment or other patient management decisions. A negative result may occur with  improper specimen collection/handling, submission of specimen other than nasopharyngeal swab, presence of viral mutation(s) within the areas targeted by this assay, and inadequate number of viral copies(<138 copies/mL). A negative result must be combined with clinical observations, patient history, and epidemiological information. The expected result is Negative.  Fact Sheet for Patients:  EntrepreneurPulse.com.au  Fact Sheet for Healthcare Providers:  IncredibleEmployment.be  This test is no t yet approved or cleared by the [place] FDA and  has been authorized for detection and/or diagnosis of SARS-CoV-2 by FDA under an Emergency Use Authorization (EUA). This EUA will remain  in effect (meaning this test can be used) for the duration of the COVID-19  declaration under Section 564(b)(1) of the Act, 21 U.S.C.section 360bbb-3(b)(1), unless the authorization is terminated  or revoked sooner.       Influenza A by PCR NEGATIVE NEGATIVE Final   Influenza B by PCR NEGATIVE NEGATIVE Final    Comment: (NOTE) The Xpert Xpress SARS-CoV-2/FLU/RSV plus assay is intended as an aid in the diagnosis of influenza from Nasopharyngeal swab specimens and should not be used as a sole basis for treatment. Nasal washings and aspirates are unacceptable for Xpert Xpress SARS-CoV-2/FLU/RSV testing.  Fact Sheet for Patients: EntrepreneurPulse.com.au  Fact Sheet for Healthcare Providers: IncredibleEmployment.be  This test is not yet approved or cleared by the [place] FDA and has been authorized for detection and/or diagnosis of SARS-CoV-2 by FDA under an Emergency Use Authorization (EUA). This EUA will remain in effect (meaning this test can  be used) for the duration of the COVID-19 declaration under Section 564(b)(1) of the Act, 21 U.S.C. section 360bbb-3(b)(1), unless the authorization is terminated or revoked.  Performed at [facility], [place] [address] [place], [place] [address]     Please note: You were cared for by a hospitalist during your hospital stay. Once you are discharged, your primary care physician will handle any further medical issues. Please note that NO REFILLS for any discharge medications will be authorized once you are discharged, as it is imperative that you return to your primary care physician (or establish a relationship with a primary care physician if you do not have one) for your post hospital discharge needs so that they can reassess your need for medications and monitor your lab values.    Time coordinating discharge: 40 minutes  SIGNED:   Shelly Coss, MD  [facility] 10/09/2020, 9:50 AM Pager [phone]  If 7PM-7AM, please contact  night-coverage [email or web address] Password [email or web address]

## 2020-10-09 NOTE — Discharge Instructions (Addendum)
Information on my medicine - ELIQUIS (apixaban)  This medication education was reviewed with me or my healthcare representative as part of my discharge preparation.     Why was Eliquis prescribed for you? Eliquis was prescribed for you to reduce the risk of a blood clot forming that can cause a stroke if you have a medical condition called atrial fibrillation (a type of irregular heartbeat).  What do You need to know about Eliquis ? Take your Eliquis TWICE DAILY - one tablet in the morning and one tablet in the evening with or without food. If you have difficulty swallowing the tablet whole please discuss with your pharmacist how to take the medication safely.  Take Eliquis exactly as prescribed by your doctor and DO NOT stop taking Eliquis without talking to the doctor who prescribed the medication.  Stopping may increase your risk of developing a stroke.  Refill your prescription before you run out.  After discharge, you should have regular check-up appointments with your healthcare provider that is prescribing your Eliquis.  In the future your dose may need to be changed if your kidney function or weight changes by a significant amount or as you get older.  What do you do if you miss a dose? If you miss a dose, take it as soon as you remember on the same day and resume taking twice daily.  Do not take more than one dose of ELIQUIS at the same time to make up a missed dose.  Important Safety Information A possible side effect of Eliquis is bleeding. You should call your healthcare provider right away if you experience any of the following: Bleeding from an injury or your nose that does not stop. Unusual colored urine (red or dark brown) or unusual colored stools (red or black). Unusual bruising for unknown reasons. A serious fall or if you hit your head (even if there is no bleeding).  Some medicines may interact with Eliquis and might increase your risk of bleeding or clotting  while on Eliquis. To help avoid this, consult your healthcare provider or pharmacist prior to using any new prescription or non-prescription medications, including herbals, vitamins, non-steroidal anti-inflammatory drugs (NSAIDs) and supplements.  This website has more information on Eliquis (apixaban): http://www.eliquis.com/eliquis/home =======================================  Atrial Fibrillation    Atrial fibrillation is a type of heartbeat that is irregular or fast. If you have this condition, your heart beats without any order. This makes it hard for your heart to pump blood in a normal way. Atrial fibrillation may come and go, or it may become a long-lasting problem. If this condition is not treated, it can put you at higher risk for stroke, heart failure, and other heart problems.  What are the causes? This condition may be caused by diseases that damage the heart. They include: High blood pressure. Heart failure. Heart valve disease. Heart surgery. Other causes include: Diabetes. Thyroid disease. Being overweight. Kidney disease. Sometimes the cause is not known.  What increases the risk? You are more likely to develop this condition if: You are older. You smoke. You exercise often and very hard. You have a family history of this condition. You are a man. You use drugs. You drink a lot of alcohol. You have lung conditions, such as emphysema, pneumonia, or COPD. You have sleep apnea.  What are the signs or symptoms? Common symptoms of this condition include: A feeling that your heart is beating very fast. Chest pain or discomfort. Feeling short of breath. Suddenly feeling   light-headed or weak. Getting tired easily during activity. Fainting. Sweating. In some cases, there are no symptoms.  How is this treated? Treatment for this condition depends on underlying conditions and how you feel when you have atrial fibrillation. They include: Medicines to: Prevent  blood clots. Treat heart rate or heart rhythm problems. Using devices, such as a pacemaker, to correct heart rhythm problems. Doing surgery to remove the part of the heart that sends bad signals. Closing an area where clots can form in the heart (left atrial appendage). In some cases, your doctor will treat other underlying conditions.  Follow these instructions at home:  Medicines Take over-the-counter and prescription medicines only as told by your doctor. Do not take any new medicines without first talking to your doctor. If you are taking blood thinners: Talk with your doctor before you take any medicines that have aspirin or NSAIDs, such as ibuprofen, in them. Take your medicine exactly as told by your doctor. Take it at the same time each day. Avoid activities that could hurt or bruise you. Follow instructions about how to prevent falls. Wear a bracelet that says you are taking blood thinners. Or, carry a card that lists what medicines you take. Lifestyle         Do not use any products that have nicotine or tobacco in them. These include cigarettes, e-cigarettes, and chewing tobacco. If you need help quitting, ask your doctor. Eat heart-healthy foods. Talk with your doctor about the right eating plan for you. Exercise regularly as told by your doctor. Do not drink alcohol. Lose weight if you are overweight. Do not use drugs, including cannabis.  General instructions If you have a condition that causes breathing to stop for a short period of time (apnea), treat it as told by your doctor. Keep a healthy weight. Do not use diet pills unless your doctor says they are safe for you. Diet pills may make heart problems worse. Keep all follow-up visits as told by your doctor. This is important.  Contact a doctor if: You notice a change in the speed, rhythm, or strength of your heartbeat. You are taking a blood-thinning medicine and you get more bruising. You get tired more easily  when you move or exercise. You have a sudden change in weight.  Get help right away if:    You have pain in your chest or your belly (abdomen). You have trouble breathing. You have side effects of blood thinners, such as blood in your vomit, poop (stool), or pee (urine), or bleeding that cannot stop. You have any signs of a stroke. "BE FAST" is an easy way to remember the main warning signs: B - Balance. Signs are dizziness, sudden trouble walking, or loss of balance. E - Eyes. Signs are trouble seeing or a change in how you see. F - Face. Signs are sudden weakness or loss of feeling in the face, or the face or eyelid drooping on one side. A - Arms. Signs are weakness or loss of feeling in an arm. This happens suddenly and usually on one side of the body. S - Speech. Signs are sudden trouble speaking, slurred speech, or trouble understanding what people say. T - Time. Time to call emergency services. Write down what time symptoms started. You have other signs of a stroke, such as: A sudden, very bad headache with no known cause. Feeling like you may vomit (nausea). Vomiting. A seizure.  These symptoms may be an emergency. Do not wait to   see if the symptoms will go away. Get medical help right away. Call your local emergency services (911 in the [place] Do not drive yourself to the hospital. Summary Atrial fibrillation is a type of heartbeat that is irregular or fast. You are at higher risk of this condition if you smoke, are older, have diabetes, or are overweight. Follow your doctor's instructions about medicines, diet, exercise, and follow-up visits. Get help right away if you have signs or symptoms of a stroke. Get help right away if you cannot catch your breath, or you have chest pain or discomfort. This information is not intended to replace advice given to you by your health care provider. Make sure you discuss any questions you have with your health care provider. Document  Revised: 08/11/2018 Document Reviewed: 08/11/2018 Elsevier Patient Education  2020 Elsevier Inc.    

## 2020-10-11 DIAGNOSIS — E119 Type 2 diabetes mellitus without complications: Secondary | ICD-10-CM | POA: Diagnosis not present

## 2020-10-11 DIAGNOSIS — N390 Urinary tract infection, site not specified: Secondary | ICD-10-CM | POA: Diagnosis not present

## 2020-10-11 DIAGNOSIS — K5900 Constipation, unspecified: Secondary | ICD-10-CM | POA: Diagnosis not present

## 2020-10-11 DIAGNOSIS — D649 Anemia, unspecified: Secondary | ICD-10-CM | POA: Diagnosis not present

## 2020-10-11 DIAGNOSIS — N189 Chronic kidney disease, unspecified: Secondary | ICD-10-CM | POA: Diagnosis not present

## 2020-10-13 DIAGNOSIS — R4701 Aphasia: Secondary | ICD-10-CM | POA: Diagnosis not present

## 2020-10-13 DIAGNOSIS — J189 Pneumonia, unspecified organism: Secondary | ICD-10-CM | POA: Diagnosis not present

## 2020-10-13 DIAGNOSIS — E119 Type 2 diabetes mellitus without complications: Secondary | ICD-10-CM | POA: Diagnosis not present

## 2020-10-13 DIAGNOSIS — I4891 Unspecified atrial fibrillation: Secondary | ICD-10-CM | POA: Diagnosis not present

## 2020-10-13 DIAGNOSIS — I10 Essential (primary) hypertension: Secondary | ICD-10-CM | POA: Diagnosis not present

## 2020-10-13 DIAGNOSIS — E870 Hyperosmolality and hypernatremia: Secondary | ICD-10-CM | POA: Diagnosis not present

## 2020-10-13 DIAGNOSIS — E0590 Thyrotoxicosis, unspecified without thyrotoxic crisis or storm: Secondary | ICD-10-CM | POA: Diagnosis not present

## 2020-10-15 DIAGNOSIS — Z515 Encounter for palliative care: Secondary | ICD-10-CM | POA: Diagnosis not present

## 2020-10-15 DIAGNOSIS — R4182 Altered mental status, unspecified: Secondary | ICD-10-CM | POA: Diagnosis not present

## 2020-10-15 DIAGNOSIS — D649 Anemia, unspecified: Secondary | ICD-10-CM | POA: Diagnosis not present

## 2020-10-24 ENCOUNTER — Ambulatory Visit: Payer: Medicare Other | Admitting: Interventional Cardiology

## 2020-11-01 DEATH — deceased

## 2020-11-19 ENCOUNTER — Other Ambulatory Visit: Payer: Medicare Other

## 2021-01-21 ENCOUNTER — Other Ambulatory Visit: Payer: Self-pay

## 2021-01-21 NOTE — Patient Outreach (Signed)
[facility]) Care Management  01/21/2021  Kathleen [name] Sep 17, 1950 [id number]   No telephone outreach to patient to obtain mRS as notes on chart show patient deceased. MRS= 6  Thank you, [place] Care Management Assistant

## 2022-09-08 IMAGING — CT CT HEAD CODE STROKE
3 series · 14 of 47 positions shown, 16 images · non-contrast
Comparison: None.
COMPARISON: None.

Addendum:
CLINICAL DATA: Code stroke.  Neuro deficit, acute stroke suspected.

EXAM:
CT HEAD WITHOUT CONTRAST
TECHNIQUE: Contiguous axial images were obtained from the base of the skull
through the vertex without intravenous contrast.

[Series 3: head 5.0 st · axial · 0.49mm/px · z∈[-289,-109]mm · 8 of 42 slices shown, 10 images]
[im 3/42  brain]
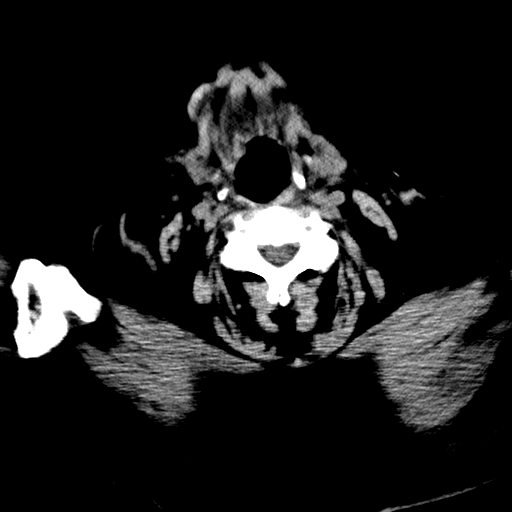
[im 3/42  bone]
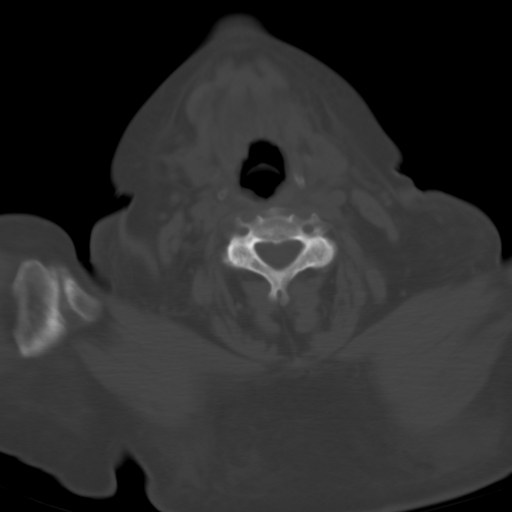
[im 9/42  brain]
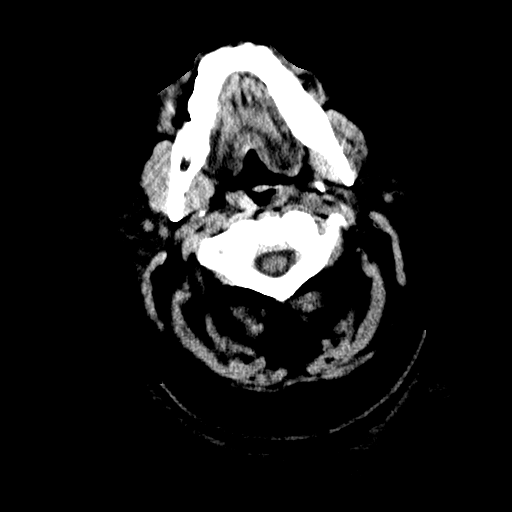
[im 13/42  brain]
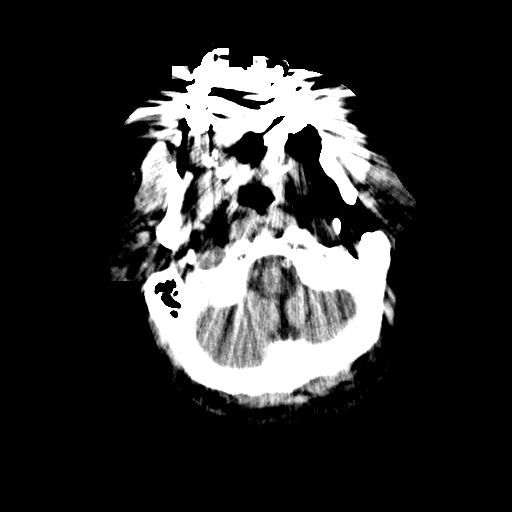
[im 19/42  brain]
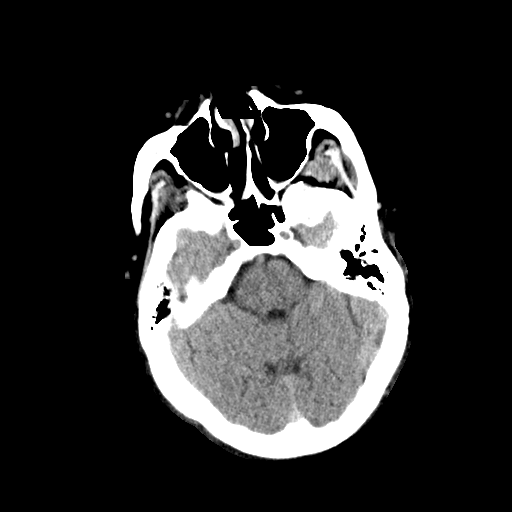
[im 23/42  brain]
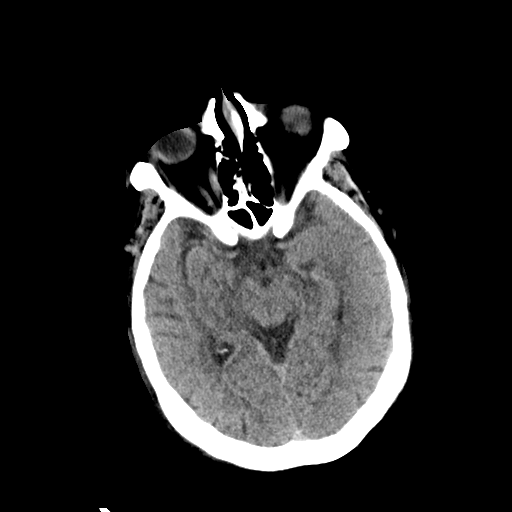
[im 23/42  bone]
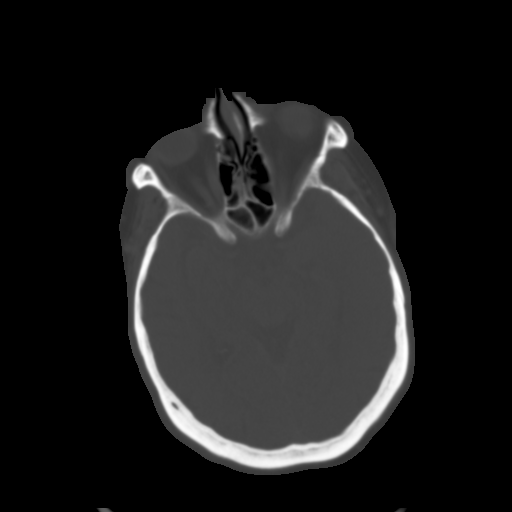
[im 29/42  brain]
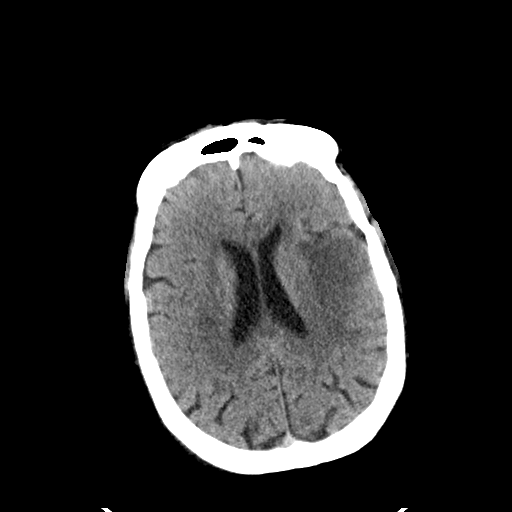
[im 33/42  brain]
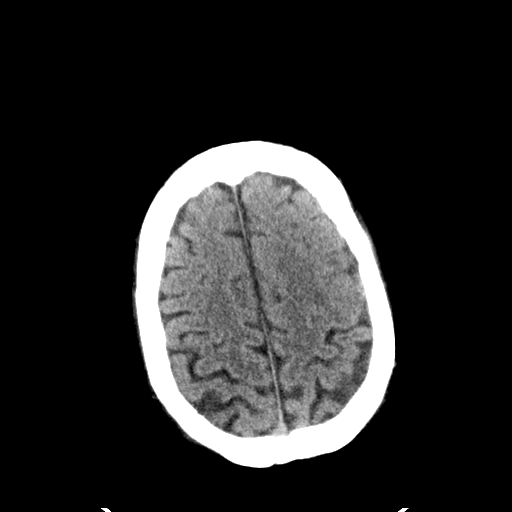
[im 39/42  brain]
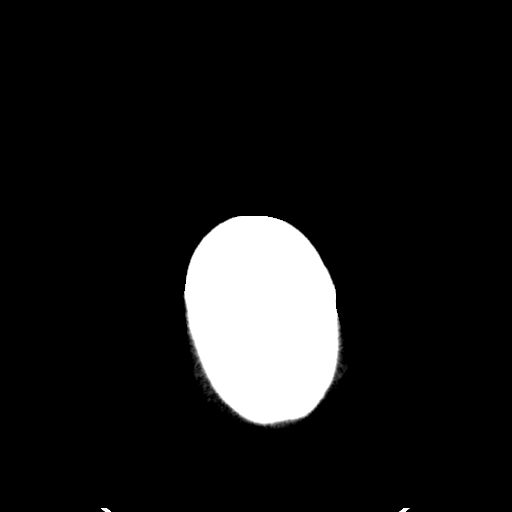

[Series 5: head 3.0 cor st · coronal · 0.40mm/px · 3 of 93 slices shown]
[im 31/93  brain]
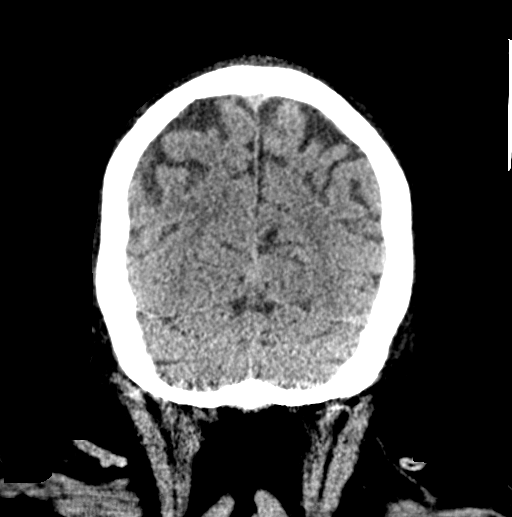
[im 41/93  brain]
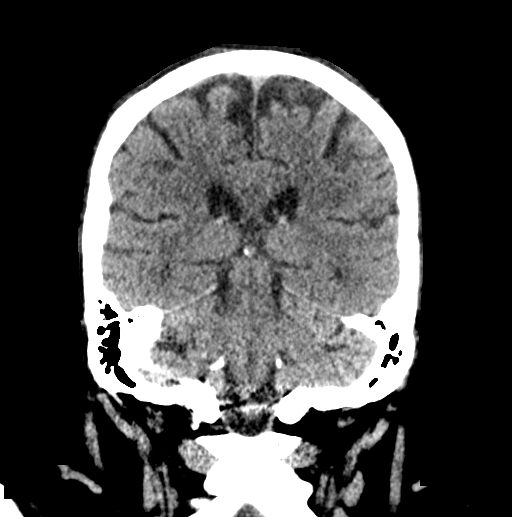
[im 52/93  brain]
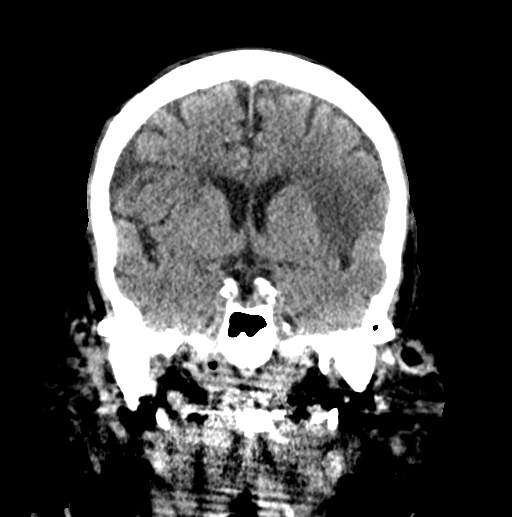

[Series 6: head 3.0 sag st · sagittal · 0.40mm/px · 3 of 67 slices shown]
[im 23/67  brain]
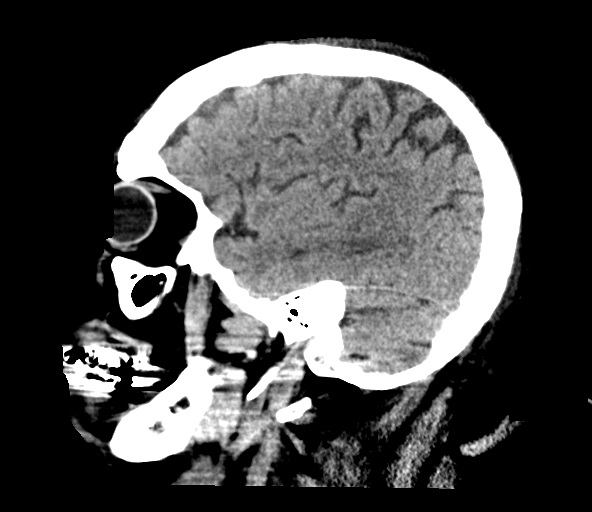
[im 34/67  brain]
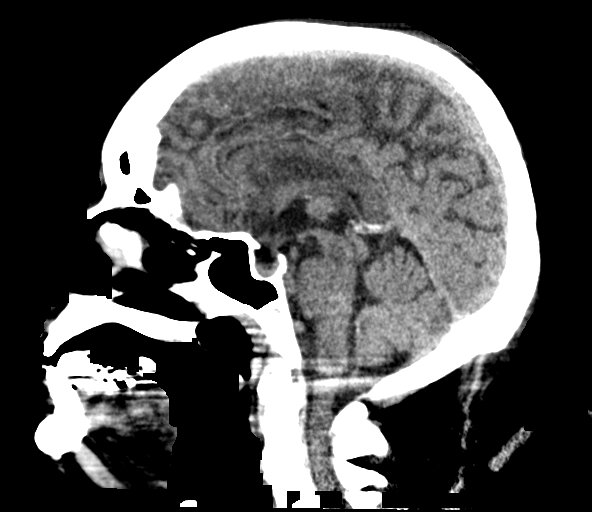
[im 45/67  brain]
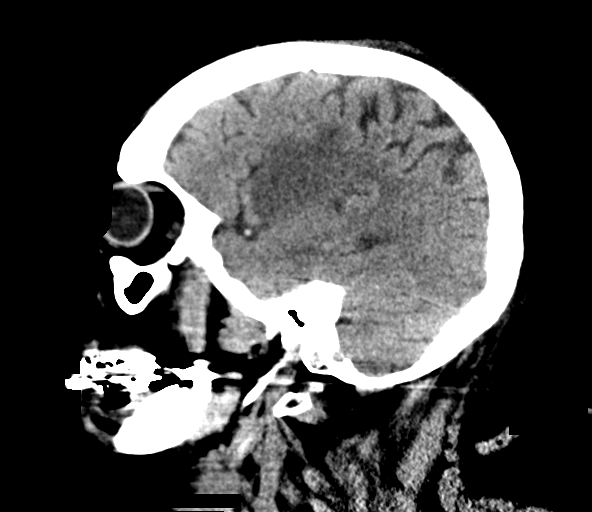

[14 of 47 positions shown; findings below may reference images not displayed]

FINDINGS: Brain: Abnormal edema and loss of gray-white differentiation
involving the left insula and overlying frontal lobe, concerning for
acute left MCA infarct. There is mild local mass effect without
midline shift. No evidence of acute hemorrhage. No hydrocephalus. No
mass lesion or midline shift. No extra-axial fluid collection. Basal
cisterns are patent.

Vascular: Hyperdense left MCA vessel in the anterior aspect of the
sylvian fissure (series 5, image 39), concerning for thrombosed
proximal M2 or distal M1 MCA artery. Calcific atherosclerosis.

Skull: No acute fracture.

Sinuses/Orbits: Small mild layering fluid in bilateral maxillary
sinuses.

Other: No mastoid effusions.

ASPECTS (Alberta Stroke Program Early CT Score)

- Ganglionic level infarction (caudate, lentiform nuclei, internal
capsule, insula, M1-M3 cortex): 5

- Supraganglionic infarction (M4-M6 cortex): 2

Total score (0-10 with 10 being normal): 7

Dr. Jhims paged at [DATE] for call of report, awaiting call back.
To avoid delay findings also communicated at [DATE] to Dr. Jhims
via secure text paging.
IMPRESSION: 1. Findings compatible with an acute left MCA territory infarct, as
detailed above. ASPECTS is 7.
2. Hyperdense left MCA vessel in the anterior aspect of the sylvian
fissure (series 5, image 39), concerning for thrombosed proximal M2
or distal M1 MCA artery. Recommend CTA.
3. No evidence of acute hemorrhage.

ADDENDUM:
In addition to the text page detailed in the report, findings also
conveyed to Dr. Jhims via telephone at [DATE] p.m.

*** End of Addendum ***
FINDINGS: Brain: Abnormal edema and loss of gray-white differentiation
involving the left insula and overlying frontal lobe, concerning for
acute left MCA infarct. There is mild local mass effect without
midline shift. No evidence of acute hemorrhage. No hydrocephalus. No
mass lesion or midline shift. No extra-axial fluid collection. Basal
cisterns are patent.

Vascular: Hyperdense left MCA vessel in the anterior aspect of the
sylvian fissure (series 5, image 39), concerning for thrombosed
proximal M2 or distal M1 MCA artery. Calcific atherosclerosis.

Skull: No acute fracture.

Sinuses/Orbits: Small mild layering fluid in bilateral maxillary
sinuses.

Other: No mastoid effusions.

ASPECTS (Alberta Stroke Program Early CT Score)

- Ganglionic level infarction (caudate, lentiform nuclei, internal
capsule, insula, M1-M3 cortex): 5

- Supraganglionic infarction (M4-M6 cortex): 2

Total score (0-10 with 10 being normal): 7

Dr. Jhims paged at [DATE] for call of report, awaiting call back.
To avoid delay findings also communicated at [DATE] to Dr. Jhims
via secure text paging.
IMPRESSION: 1. Findings compatible with an acute left MCA territory infarct, as
detailed above. ASPECTS is 7.
2. Hyperdense left MCA vessel in the anterior aspect of the sylvian
fissure (series 5, image 39), concerning for thrombosed proximal M2
or distal M1 MCA artery. Recommend CTA.
3. No evidence of acute hemorrhage.
